# Patient Record
Sex: Female | Born: 1970 | Race: Black or African American | Hispanic: No | Marital: Single | Smoking: Former smoker
Health system: Southern US, Community
[De-identification: ages and names within clinical notes are randomized; demographics above are authoritative.]

## PROBLEM LIST (undated history)

## (undated) VITALS — BP 107/77 | HR 84 | Temp 97.8°F | Resp 16 | Ht 64.0 in | Wt 178.0 lb

## (undated) DIAGNOSIS — F319 Bipolar disorder, unspecified: Secondary | ICD-10-CM

## (undated) DIAGNOSIS — F32A Depression, unspecified: Secondary | ICD-10-CM

## (undated) DIAGNOSIS — G43909 Migraine, unspecified, not intractable, without status migrainosus: Secondary | ICD-10-CM

## (undated) DIAGNOSIS — F419 Anxiety disorder, unspecified: Secondary | ICD-10-CM

## (undated) DIAGNOSIS — I82409 Acute embolism and thrombosis of unspecified deep veins of unspecified lower extremity: Secondary | ICD-10-CM

## (undated) DIAGNOSIS — K589 Irritable bowel syndrome without diarrhea: Secondary | ICD-10-CM

## (undated) DIAGNOSIS — I10 Essential (primary) hypertension: Secondary | ICD-10-CM

## (undated) DIAGNOSIS — D649 Anemia, unspecified: Secondary | ICD-10-CM

## (undated) DIAGNOSIS — M1990 Unspecified osteoarthritis, unspecified site: Secondary | ICD-10-CM

## (undated) DIAGNOSIS — K219 Gastro-esophageal reflux disease without esophagitis: Secondary | ICD-10-CM

## (undated) DIAGNOSIS — F4310 Post-traumatic stress disorder, unspecified: Secondary | ICD-10-CM

## (undated) DIAGNOSIS — K56609 Unspecified intestinal obstruction, unspecified as to partial versus complete obstruction: Secondary | ICD-10-CM

## (undated) DIAGNOSIS — I2699 Other pulmonary embolism without acute cor pulmonale: Secondary | ICD-10-CM

## (undated) DIAGNOSIS — F329 Major depressive disorder, single episode, unspecified: Secondary | ICD-10-CM

## (undated) HISTORY — PX: TUBAL LIGATION: SHX77

## (undated) HISTORY — PX: CHOLECYSTECTOMY: SHX55

## (undated) HISTORY — PX: ABDOMINAL SURGERY: SHX537

## (undated) HISTORY — PX: ROTATOR CUFF REPAIR: SHX139

## (undated) HISTORY — PX: APPENDECTOMY: SHX54

## (undated) HISTORY — PX: ABDOMINAL HYSTERECTOMY: SHX81

## (undated) HISTORY — PX: KNEE ARTHROSCOPY: SHX127

---

## 2012-06-26 ENCOUNTER — Encounter (HOSPITAL_COMMUNITY): Payer: Self-pay | Admitting: Emergency Medicine

## 2012-06-26 DIAGNOSIS — Z8679 Personal history of other diseases of the circulatory system: Secondary | ICD-10-CM | POA: Insufficient documentation

## 2012-06-26 DIAGNOSIS — Z86711 Personal history of pulmonary embolism: Secondary | ICD-10-CM | POA: Insufficient documentation

## 2012-06-26 DIAGNOSIS — R079 Chest pain, unspecified: Secondary | ICD-10-CM | POA: Insufficient documentation

## 2012-06-26 DIAGNOSIS — Z8719 Personal history of other diseases of the digestive system: Secondary | ICD-10-CM | POA: Insufficient documentation

## 2012-06-26 DIAGNOSIS — Z86718 Personal history of other venous thrombosis and embolism: Secondary | ICD-10-CM | POA: Insufficient documentation

## 2012-06-26 DIAGNOSIS — I10 Essential (primary) hypertension: Secondary | ICD-10-CM | POA: Insufficient documentation

## 2012-06-26 DIAGNOSIS — Z8659 Personal history of other mental and behavioral disorders: Secondary | ICD-10-CM | POA: Insufficient documentation

## 2012-06-26 LAB — CBC WITH DIFFERENTIAL/PLATELET
Basophils Absolute: 0 10*3/uL (ref 0.0–0.1)
Basophils Relative: 0 % (ref 0–1)
MCH: 33.2 pg (ref 26.0–34.0)
MCHC: 35.7 g/dL (ref 30.0–36.0)
MCV: 92.8 fL (ref 78.0–100.0)
Metamyelocytes Relative: 0 %
Myelocytes: 0 %
Neutro Abs: 5 10*3/uL (ref 1.7–7.7)
Neutrophils Relative %: 49 % (ref 43–77)
Platelets: 238 10*3/uL (ref 150–400)
Promyelocytes Absolute: 0 %
nRBC: 0 /100 WBC

## 2012-06-26 LAB — COMPREHENSIVE METABOLIC PANEL
ALT: 10 U/L (ref 0–35)
AST: 20 U/L (ref 0–37)
Albumin: 4.1 g/dL (ref 3.5–5.2)
Alkaline Phosphatase: 86 U/L (ref 39–117)
BUN: 10 mg/dL (ref 6–23)
Chloride: 106 mEq/L (ref 96–112)
Potassium: 3.9 mEq/L (ref 3.5–5.1)
Sodium: 139 mEq/L (ref 135–145)
Total Bilirubin: 0.2 mg/dL — ABNORMAL LOW (ref 0.3–1.2)

## 2012-06-26 LAB — POCT I-STAT TROPONIN I: Troponin i, poc: 0.01 ng/mL (ref 0.00–0.08)

## 2012-06-26 NOTE — ED Notes (Signed)
Pt st's every am when she wakes up she has a headache and when she eats she develops chest pain that she describes as a tightness causing her to feel like she can't breath.  Pt st's her PCP has given her multi meds for GERD and reflux and it does not help.  Pt st's she has a lot of issues.  Pt very tearful in triage.

## 2012-06-27 ENCOUNTER — Emergency Department (HOSPITAL_COMMUNITY): Payer: Medicaid Other

## 2012-06-27 ENCOUNTER — Encounter (HOSPITAL_COMMUNITY): Payer: Self-pay | Admitting: Emergency Medicine

## 2012-06-27 ENCOUNTER — Emergency Department (HOSPITAL_COMMUNITY)
Admission: EM | Admit: 2012-06-27 | Discharge: 2012-06-27 | Disposition: A | Discharge status: Home / Self Care | Payer: Medicaid Other | Attending: Emergency Medicine | Admitting: Emergency Medicine

## 2012-06-27 DIAGNOSIS — R079 Chest pain, unspecified: Secondary | ICD-10-CM

## 2012-06-27 HISTORY — DX: Migraine, unspecified, not intractable, without status migrainosus: G43.909

## 2012-06-27 HISTORY — DX: Post-traumatic stress disorder, unspecified: F43.10

## 2012-06-27 HISTORY — DX: Acute embolism and thrombosis of unspecified deep veins of unspecified lower extremity: I82.409

## 2012-06-27 HISTORY — DX: Bipolar disorder, unspecified: F31.9

## 2012-06-27 HISTORY — DX: Unspecified intestinal obstruction, unspecified as to partial versus complete obstruction: K56.609

## 2012-06-27 HISTORY — DX: Irritable bowel syndrome, unspecified: K58.9

## 2012-06-27 HISTORY — DX: Other pulmonary embolism without acute cor pulmonale: I26.99

## 2012-06-27 HISTORY — DX: Essential (primary) hypertension: I10

## 2012-06-27 LAB — D-DIMER, QUANTITATIVE: D-Dimer, Quant: 0.95 ug{FEU}/mL — ABNORMAL HIGH (ref 0.00–0.48)

## 2012-06-27 MED ORDER — SODIUM CHLORIDE 0.9 % IV SOLN
Freq: Once | INTRAVENOUS | Status: AC
  Administered 2012-06-27: 03:00:00 via INTRAVENOUS

## 2012-06-27 MED ORDER — FAMOTIDINE 20 MG PO TABS: 20.0000 mg | ORAL_TABLET | Freq: Two times a day (BID) | ORAL | Status: DC

## 2012-06-27 MED ORDER — LORAZEPAM 2 MG/ML IJ SOLN
1.0000 mg | Freq: Once | INTRAMUSCULAR | Status: AC
  Administered 2012-06-27: 1 mg via INTRAVENOUS
  Filled 2012-06-27: qty 1

## 2012-06-27 MED ORDER — FAMOTIDINE IN NACL 20-0.9 MG/50ML-% IV SOLN
20.0000 mg | Freq: Once | INTRAVENOUS | Status: AC
  Administered 2012-06-27: 20 mg via INTRAVENOUS
  Filled 2012-06-27: qty 50

## 2012-06-27 MED ORDER — SUCRALFATE 1 G PO TABS: 1.0000 g | ORAL_TABLET | Freq: Four times a day (QID) | ORAL | Status: DC

## 2012-06-27 MED ORDER — IOHEXOL 350 MG/ML SOLN
100.0000 mL | Freq: Once | INTRAVENOUS | Status: AC | PRN
  Administered 2012-06-27: 100 mL via INTRAVENOUS

## 2012-06-27 MED ORDER — GI COCKTAIL ~~LOC~~
30.0000 mL | Freq: Once | ORAL | Status: AC
  Administered 2012-06-27: 30 mL via ORAL
  Filled 2012-06-27: qty 30

## 2012-06-27 NOTE — ED Notes (Signed)
Pt transported to XR, pt purse with pt in bed

## 2012-06-27 NOTE — ED Notes (Signed)
EDP [place] at bedside 

## 2012-06-27 NOTE — ED Notes (Signed)
MD at bedside. 

## 2012-06-27 NOTE — Discharge Instructions (Signed)
As discussed, it is important that you follow up as soon as possible with your physician for continued management of your condition.  Your pain is most consistent with a gastroenterologic cause.  If you develop any new, or concerning changes in your condition, please return to the emergency department immediately.   Chest Pain (Nonspecific) It is often hard to give a specific diagnosis for the cause of chest pain. There is always a chance that your pain could be related to something serious, such as a heart attack or a blood clot in the lungs. You need to follow up with your caregiver for further evaluation. CAUSES   Heartburn.  Pneumonia or bronchitis.  Anxiety or stress.  Inflammation around your heart (pericarditis) or lung (pleuritis or pleurisy).  A blood clot in the lung.  A collapsed lung (pneumothorax). It can develop suddenly on its own (spontaneous pneumothorax) or from injury (trauma) to the chest.  Shingles infection (herpes zoster virus). The chest wall is composed of bones, muscles, and cartilage. Any of these can be the source of the pain.  The bones can be bruised by injury.  The muscles or cartilage can be strained by coughing or overwork.  The cartilage can be affected by inflammation and become sore (costochondritis). DIAGNOSIS  Lab tests or other studies, such as X-rays, electrocardiography, stress testing, or cardiac imaging, may be needed to find the cause of your pain.  TREATMENT   Treatment depends on what may be causing your chest pain. Treatment may include:  Acid blockers for heartburn.  Anti-inflammatory medicine.  Pain medicine for inflammatory conditions.  Antibiotics if an infection is present.  You may be advised to change lifestyle habits. This includes stopping smoking and avoiding alcohol, caffeine, and chocolate.  You may be advised to keep your head raised (elevated) when sleeping. This reduces the chance of acid going backward from  your stomach into your esophagus.  Most of the time, nonspecific chest pain will improve within 2 to 3 days with rest and mild pain medicine. HOME CARE INSTRUCTIONS   If antibiotics were prescribed, take your antibiotics as directed. Finish them even if you start to feel better.  For the next few days, avoid physical activities that bring on chest pain. Continue physical activities as directed.  Do not smoke.  Avoid drinking alcohol.  Only take over-the-counter or prescription medicine for pain, discomfort, or fever as directed by your caregiver.  Follow your caregiver's suggestions for further testing if your chest pain does not go away.  Keep any follow-up appointments you made. If you do not go to an appointment, you could develop lasting (chronic) problems with pain. If there is any problem keeping an appointment, you must call to reschedule. SEEK MEDICAL CARE IF:   You think you are having problems from the medicine you are taking. Read your medicine instructions carefully.  Your chest pain does not go away, even after treatment.  You develop a rash with blisters on your chest. SEEK IMMEDIATE MEDICAL CARE IF:   You have increased chest pain or pain that spreads to your arm, neck, jaw, back, or abdomen.  You develop shortness of breath, an increasing cough, or you are coughing up blood.  You have severe back or abdominal pain, feel nauseous, or vomit.  You develop severe weakness, fainting, or chills.  You have a fever. THIS IS AN EMERGENCY. Do not wait to see if the pain will go away. Get medical help at once. Call your local emergency  services (911 in [place] Do not drive yourself to the hospital. MAKE SURE YOU:   Understand these instructions.  Will watch your condition.  Will get help right away if you are not doing well or get worse. Document Released: 01/12/2005 Document Revised: 06/27/2011 Document Reviewed: 11/08/2007 [facility] Patient Information 2013  [place], [place].

## 2012-06-27 NOTE — ED Provider Notes (Signed)
History     CSN: [id number]  Arrival date & time 06/26/12  2041   First MD Initiated Contact with Patient 06/27/12 [phone]      Chief Complaint  Patient presents with  . Chest Pain    (Consider location/radiation/quality/duration/timing/severity/associated sxs/prior treatment) HPI The patient presents with chest pain, dyspnea.  She states that symptoms began approximately 2 days ago.  Since onset she has had intermittent, but mostly present symptoms.  Symptoms are minimally improved with OTC medication for heartburn.  She notes that there is concurrent anxiety over her symptoms.  She denies syncope, generalized weakness, other pain. She has a history of DVT, is not currently taking anticoagulant.  She denies any current, or recent lower extremity changes, including edema. She does not smoke, has not traveled, has no new medication. Past Medical History  Diagnosis Date  . Hypertension   . Migraine   . IBS (irritable bowel syndrome)   . Bipolar affective disorder   . PTSD (post-traumatic stress disorder)   . Pulmonary embolism   . DVT (deep venous thrombosis)   . Bowel obstruction     Past Surgical History  Procedure Laterality Date  . Abdominal surgery    . Abdominal hysterectomy    . Knee arthroscopy    . Appendectomy    . Cholecystectomy      No family history on file.  History  Substance Use Topics  . Smoking status: Never Smoker   . Smokeless tobacco: Not on file  . Alcohol Use: Yes    OB History   Grav Para Term Preterm Abortions TAB SAB Ect Mult Living                  Review of Systems  Constitutional:       Per HPI, otherwise negative  HENT:       Per HPI, otherwise negative  Respiratory:       Per HPI, otherwise negative  Cardiovascular:       Per HPI, otherwise negative  Gastrointestinal: Negative for vomiting.  Endocrine:       Negative aside from HPI  Genitourinary:       Neg aside from HPI   Musculoskeletal:       Per HPI, otherwise  negative  Skin: Negative.   Neurological: Negative for syncope.    Allergies  Benadryl; Ciprofloxacin hcl; Morphine and related; and Sulfa antibiotics  Home Medications  No current outpatient prescriptions on file.  BP 108/70  Pulse 96  Temp(Src) 98.8 F (37.1 C) (Oral)  Resp 18  SpO2 100%  Physical Exam  Nursing note and vitals reviewed. Constitutional: She is oriented to person, place, and time. She appears well-developed and well-nourished. No distress.  HENT:  Head: Normocephalic and atraumatic.  Eyes: Conjunctivae and EOM are normal.  Cardiovascular: Regular rhythm.  Tachycardia present.   Pulmonary/Chest: Breath sounds normal. No stridor. Tachypnea noted. No respiratory distress.  Abdominal: She exhibits no distension.  Musculoskeletal: She exhibits no edema and no tenderness.  Neurological: She is alert and oriented to person, place, and time. No cranial nerve deficit.  Skin: Skin is warm and dry.  Psychiatric: Her mood appears anxious.    ED Course  Procedures (including critical care time)  Labs Reviewed  CBC WITH DIFFERENTIAL - Abnormal; Notable for the following:    Lymphs Abs 4.4 (*)    All other components within normal limits  COMPREHENSIVE METABOLIC PANEL - Abnormal; Notable for the following:    Total Bilirubin 0.2 (*)  All other components within normal limits  D-DIMER, QUANTITATIVE - Abnormal; Notable for the following:    D-Dimer, Quant 0.95 (*)    All other components within normal limits  POCT I-STAT TROPONIN I   No results found.   No diagnosis found.  Pulse ox 99% room air normal Cardiac 110 sinus tach abnormal   3:38 AM Initial labs notable for elevated D. dimer.  CT ordered   Date: 06/27/2012  Rate: 89  Rhythm: normal sinus rhythm  QRS Axis: normal  Intervals: normal  ST/T Wave abnormalities: normal  Conduction Disutrbances: none  Narrative Interpretation: unremarkable   6:14 AM Patient asleep.  Oxygen saturation is  100%.  She is clearly in no distress.   MDM  This patient with a history of prior DVT now presents with concerns of chest pain, dyspnea.  On exam the patient is initially very uncomfortable, but not hypoxic or tachypneic.  Given the absence of ongoing anticoagulation, the patient had a d-dimer sent which was positive.  A followup CT chest did not demonstrate pulmonary embolism.  On repeat exam the patient was calm, sleeping.  Given her description of burning chest pain, this presentation is more consistent with gastro-esophageal etiology, and a nonischemic EKG, was largely reassuring labs is significant for the low suspicion of coronary ischemia.  The patient was started on antacids, discharged in stable condition to follow up with her primary care physician.       Gerhard Munch, MD 06/27/12 [phone]

## 2012-06-27 NOTE — ED Notes (Signed)
Pt remains in CT at this time.  

## 2012-06-27 NOTE — ED Notes (Signed)
Pt returned from CT, placed back on monitor.

## 2012-06-27 NOTE — ED Notes (Signed)
Pt transported to CT ?

## 2012-06-27 NOTE — ED Notes (Signed)
Pt returned from XR, placed back on monitor. 

## 2012-09-16 ENCOUNTER — Emergency Department (HOSPITAL_COMMUNITY): Payer: Medicaid Other

## 2012-09-16 ENCOUNTER — Emergency Department (HOSPITAL_COMMUNITY)
Admission: EM | Admit: 2012-09-16 | Discharge: 2012-09-16 | Disposition: A | Discharge status: Home / Self Care | Payer: Medicaid Other | Attending: Emergency Medicine | Admitting: Emergency Medicine

## 2012-09-16 ENCOUNTER — Encounter (HOSPITAL_COMMUNITY): Payer: Self-pay | Admitting: *Deleted

## 2012-09-16 DIAGNOSIS — Z86718 Personal history of other venous thrombosis and embolism: Secondary | ICD-10-CM | POA: Insufficient documentation

## 2012-09-16 DIAGNOSIS — Z8669 Personal history of other diseases of the nervous system and sense organs: Secondary | ICD-10-CM | POA: Insufficient documentation

## 2012-09-16 DIAGNOSIS — K589 Irritable bowel syndrome without diarrhea: Secondary | ICD-10-CM | POA: Insufficient documentation

## 2012-09-16 DIAGNOSIS — R1110 Vomiting, unspecified: Secondary | ICD-10-CM | POA: Insufficient documentation

## 2012-09-16 DIAGNOSIS — A599 Trichomoniasis, unspecified: Secondary | ICD-10-CM

## 2012-09-16 DIAGNOSIS — Z79899 Other long term (current) drug therapy: Secondary | ICD-10-CM | POA: Insufficient documentation

## 2012-09-16 DIAGNOSIS — R197 Diarrhea, unspecified: Secondary | ICD-10-CM | POA: Insufficient documentation

## 2012-09-16 DIAGNOSIS — F319 Bipolar disorder, unspecified: Secondary | ICD-10-CM | POA: Insufficient documentation

## 2012-09-16 DIAGNOSIS — Z8679 Personal history of other diseases of the circulatory system: Secondary | ICD-10-CM | POA: Insufficient documentation

## 2012-09-16 DIAGNOSIS — Z3202 Encounter for pregnancy test, result negative: Secondary | ICD-10-CM | POA: Insufficient documentation

## 2012-09-16 DIAGNOSIS — I10 Essential (primary) hypertension: Secondary | ICD-10-CM | POA: Insufficient documentation

## 2012-09-16 DIAGNOSIS — R109 Unspecified abdominal pain: Secondary | ICD-10-CM

## 2012-09-16 DIAGNOSIS — B9689 Other specified bacterial agents as the cause of diseases classified elsewhere: Secondary | ICD-10-CM

## 2012-09-16 DIAGNOSIS — Z8659 Personal history of other mental and behavioral disorders: Secondary | ICD-10-CM | POA: Insufficient documentation

## 2012-09-16 DIAGNOSIS — N760 Acute vaginitis: Secondary | ICD-10-CM

## 2012-09-16 LAB — CBC WITH DIFFERENTIAL/PLATELET
Eosinophils Absolute: 0.1 10*3/uL (ref 0.0–0.7)
Eosinophils Relative: 1 % (ref 0–5)
HCT: 37.2 % (ref 36.0–46.0)
Hemoglobin: 12.8 g/dL (ref 12.0–15.0)
Lymphocytes Relative: 49 % — ABNORMAL HIGH (ref 12–46)
Lymphs Abs: 2.1 10*3/uL (ref 0.7–4.0)
MCH: 32.1 pg (ref 26.0–34.0)
MCV: 93.2 fL (ref 78.0–100.0)
Monocytes Absolute: 0.5 10*3/uL (ref 0.1–1.0)
Monocytes Relative: 11 % (ref 3–12)
RBC: 3.99 MIL/uL (ref 3.87–5.11)
WBC: 4.4 10*3/uL (ref 4.0–10.5)

## 2012-09-16 LAB — URINALYSIS, ROUTINE W REFLEX MICROSCOPIC
Ketones, ur: 15 mg/dL — AB
Nitrite: NEGATIVE
Specific Gravity, Urine: 1.026 (ref 1.005–1.030)
Urobilinogen, UA: 0.2 mg/dL (ref 0.0–1.0)
pH: 5.5 (ref 5.0–8.0)

## 2012-09-16 LAB — COMPREHENSIVE METABOLIC PANEL
ALT: 21 U/L (ref 0–35)
BUN: 6 mg/dL (ref 6–23)
CO2: 21 mEq/L (ref 19–32)
Calcium: 9.4 mg/dL (ref 8.4–10.5)
GFR calc Af Amer: >90 mL/min (ref >90)
GFR calc non Af Amer: >90 mL/min (ref >90)
Glucose, Bld: 105 mg/dL — ABNORMAL HIGH (ref 70–99)
Total Protein: 7.3 g/dL (ref 6.0–8.3)

## 2012-09-16 LAB — WET PREP, GENITAL

## 2012-09-16 LAB — URINE MICROSCOPIC-ADD ON

## 2012-09-16 LAB — LIPASE, BLOOD: Lipase: 17 U/L (ref 11–59)

## 2012-09-16 MED ORDER — HYDROCODONE-ACETAMINOPHEN 5-325 MG PO TABS: 2.0000 | ORAL_TABLET | Freq: Three times a day (TID) | ORAL | Status: DC | PRN

## 2012-09-16 MED ORDER — HYDROMORPHONE HCL PF 1 MG/ML IJ SOLN
0.5000 mg | Freq: Once | INTRAMUSCULAR | Status: AC
  Administered 2012-09-16: 0.5 mg via INTRAVENOUS
  Filled 2012-09-16: qty 1

## 2012-09-16 MED ORDER — IOHEXOL 300 MG/ML  SOLN
100.0000 mL | Freq: Once | INTRAMUSCULAR | Status: AC | PRN
  Administered 2012-09-16: 100 mL via INTRAVENOUS

## 2012-09-16 MED ORDER — METRONIDAZOLE 500 MG PO TABS: 500.0000 mg | ORAL_TABLET | Freq: Two times a day (BID) | ORAL | Status: DC

## 2012-09-16 MED ORDER — IOHEXOL 300 MG/ML  SOLN
50.0000 mL | Freq: Once | INTRAMUSCULAR | Status: AC | PRN
  Administered 2012-09-16: 50 mL via ORAL

## 2012-09-16 MED ORDER — SODIUM CHLORIDE 0.9 % IV BOLUS (SEPSIS)
1000.0000 mL | Freq: Once | INTRAVENOUS | Status: AC
  Administered 2012-09-16: 1000 mL via INTRAVENOUS

## 2012-09-16 MED ORDER — HYDROMORPHONE HCL PF 1 MG/ML IJ SOLN
1.0000 mg | Freq: Once | INTRAMUSCULAR | Status: AC
  Administered 2012-09-16: 1 mg via INTRAVENOUS
  Filled 2012-09-16: qty 1

## 2012-09-16 MED ORDER — ONDANSETRON HCL 4 MG/2ML IJ SOLN
4.0000 mg | Freq: Once | INTRAMUSCULAR | Status: AC
  Administered 2012-09-16: 4 mg via INTRAVENOUS
  Filled 2012-09-16: qty 2

## 2012-09-16 NOTE — ED Provider Notes (Signed)
Complains of lower abdominal pain radiating to upper abdomen onset 2 days ago compared to multiple episodes of vomiting. On exam patient appears uncomfortable abdomen tender percussion of lower quadrants and tender over bilateral upper quadrants.  Doug Sou, MD 09/16/12 [phone]

## 2012-09-16 NOTE — ED Provider Notes (Signed)
Discussed case with Wynetta Emery, PA-C - transfer of care from [facility], PA-C at 3:10PM. Patient presenting with LLQ pain x 1 week with nausea and vomiting for the past 2 days. Patient has a significant history of abdominal surgeries - at least 5-6 surgeries as per patient. Negative findings on labs - negative elevated WBC, electrolytes within normal limits, lipase within normal limits. Abdominal exam: BS normoactive, extremely tender upon palpation to abdomen - generalized. CT abdomen with contrast - negative bowel obstruction, no bowel wall thickening or inflammation. Abdominal and Pelvic US ordered to rule out ovarian torsion.  Rectal: negative hemorrhoids, inflammation, swelling, lesions, sores noted externally. Negative lesions, sores, fissures noted. Sphincter tone proper. Negative blood on glove. Stool brown. Negative facl occult test.  Pelvic exam: negative lesions, sores, inflammation, swelling, erythema noted to the external genitalia. Negative lesions, sores, fissures, lesions noted to the vaginal canal. Negative blood in vaginal vault. Mild white discharge, thick noted to pelvic exam. Negative erythema, inflammation, strawberry cervix noted. Negative masses, lesions, fissures felt upon palpation. Pain with swiping of abdomen and adenexa. Negative CMT.  Wet prep - few trich, few clue cells, few WBC noted.   US pelvic - negative findings.  Discussed case and findings with Dr. Brandon Melnick - recommended that patient to be discharged since nothing inflammatory found or acute findings found. Negative findings on CT and [place]. Pain managed in ED setting. Abdominal pain unknown etiology. Patient stable, afebrile, no elevated WBC count. CMP within normal limits. Lipase within normal limits. Negative bowel obstruction, inflammation, ovarian torsion. BC and trich infection noted. PERC negative. Patient cleared for discharge by Dr. Algis Downs. Judd Lien. Patient discharged with antibiotic therapy and pain medications.  Discussed with patient to rest and stay hydrated - discussed precautions while on pain medications. Referred to [facility] outpatient and GI. Discussed with patient to stay with liquid diet. Discussed with patient to monitor symptoms and if symptoms do not get better within 24 hours to return back to the ED. Patient agreed to plan of care, understood, all questions answered.     Raymon Mutton, PA-C 09/17/12 0202  Raymon Mutton, PA-C 09/17/12 0865  Raymon Mutton, PA-C 09/17/12 [id number]

## 2012-09-16 NOTE — ED Notes (Signed)
Pt unable to void at the present time
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
## 2012-09-16 NOTE — ED Notes (Signed)
Patient transported to Ultrasound 

## 2012-09-16 NOTE — ED Notes (Signed)
Pt states over last week started having lower abdominal pain and since Friday started having vomiting and diarrhea.   No urinary s/s.  No vaginal symptoms.  LMP  Hysterectomy.  PT reports right lower back pain

## 2012-09-17 LAB — GC/CHLAMYDIA PROBE AMP: CT Probe RNA: NEGATIVE

## 2012-09-17 NOTE — ED Provider Notes (Signed)
Medical screening examination/treatment/procedure(s) were performed by non-physician practitioner and as supervising physician I was immediately available for consultation/collaboration.  Sricharan Lacomb, MD 09/17/12 0816 

## 2012-09-18 NOTE — ED Provider Notes (Signed)
Spoke with patient today, 09/18/2012 at 3:20PM, via telephone, to check and see how patient is doing. Patient reported that abdominal pain has not gotten any better - stated that she is still experiencing shooting pains, feeling nauseous with medications, still experiencing pain even with pain medications - stated that she is not feeling like herself. Instructed patient to go to [facility] ED to get re-evaluated immediately - patient understood and agreed to going to the ED for re-evaluation. Patient agreed to plan and stated that she will go to the hospital.    Raymon Mutton, PA-C 09/19/12 0351  Raymon Mutton, PA-C 09/19/12 4098

## 2012-09-19 NOTE — ED Provider Notes (Signed)
Medical screening examination/treatment/procedure(s) were conducted as a shared visit with non-physician practitioner(s) and myself.  I personally evaluated the patient during the encounter  Doug Sou, MD 09/19/12 [phone]

## 2012-09-19 NOTE — ED Provider Notes (Signed)
Medical screening examination/treatment/procedure(s) were performed by non-physician practitioner and as supervising physician I was immediately available for consultation/collaboration.  Nyazia Canevari, MD 09/19/12 0807 

## 2012-11-15 ENCOUNTER — Encounter (HOSPITAL_COMMUNITY): Payer: Self-pay | Admitting: *Deleted

## 2012-11-15 ENCOUNTER — Telehealth (HOSPITAL_COMMUNITY): Payer: Self-pay

## 2012-11-15 ENCOUNTER — Inpatient Hospital Stay (HOSPITAL_COMMUNITY)
Admission: RE | Admit: 2012-11-15 | Discharge: 2012-11-22 | DRG: 885 | Disposition: A | Discharge status: Home / Self Care | Payer: Medicaid Other | Attending: Psychiatry | Admitting: Psychiatry

## 2012-11-15 DIAGNOSIS — F411 Generalized anxiety disorder: Secondary | ICD-10-CM | POA: Diagnosis present

## 2012-11-15 DIAGNOSIS — R45851 Suicidal ideations: Secondary | ICD-10-CM

## 2012-11-15 DIAGNOSIS — Z79899 Other long term (current) drug therapy: Secondary | ICD-10-CM

## 2012-11-15 DIAGNOSIS — F3130 Bipolar disorder, current episode depressed, mild or moderate severity, unspecified: Principal | ICD-10-CM | POA: Diagnosis present

## 2012-11-15 DIAGNOSIS — I10 Essential (primary) hypertension: Secondary | ICD-10-CM | POA: Diagnosis present

## 2012-11-15 DIAGNOSIS — F3160 Bipolar disorder, current episode mixed, unspecified: Secondary | ICD-10-CM

## 2012-11-15 DIAGNOSIS — F4310 Post-traumatic stress disorder, unspecified: Secondary | ICD-10-CM | POA: Diagnosis present

## 2012-11-15 DIAGNOSIS — F1910 Other psychoactive substance abuse, uncomplicated: Secondary | ICD-10-CM

## 2012-11-15 LAB — COMPREHENSIVE METABOLIC PANEL
AST: 22 U/L (ref 0–37)
CO2: 22 mEq/L (ref 19–32)
Calcium: 8.9 mg/dL (ref 8.4–10.5)
Creatinine, Ser: 0.8 mg/dL (ref 0.50–1.10)
GFR calc Af Amer: >90 mL/min (ref >90)
GFR calc non Af Amer: 88 mL/min — ABNORMAL LOW (ref >90)
Total Protein: 7 g/dL (ref 6.0–8.3)

## 2012-11-15 LAB — CBC
MCH: 31.4 pg (ref 26.0–34.0)
MCHC: 33.6 g/dL (ref 30.0–36.0)
MCV: 93.5 fL (ref 78.0–100.0)
Platelets: 255 10*3/uL (ref 150–400)
RBC: 3.98 MIL/uL (ref 3.87–5.11)

## 2012-11-15 MED ORDER — ALUM & MAG HYDROXIDE-SIMETH 200-200-20 MG/5ML PO SUSP: 30.0000 mL | ORAL | Status: DC | PRN

## 2012-11-15 MED ORDER — METOPROLOL SUCCINATE ER 25 MG PO TB24
25.0000 mg | ORAL_TABLET | Freq: Every day | ORAL | Status: DC
  Administered 2012-11-15 – 2012-11-22 (×8): 25 mg via ORAL
  Filled 2012-11-15 (×10): qty 1

## 2012-11-15 MED ORDER — BUPROPION HCL ER (XL) 150 MG PO TB24
150.0000 mg | ORAL_TABLET | Freq: Every day | ORAL | Status: DC
  Administered 2012-11-15 – 2012-11-22 (×8): 150 mg via ORAL
  Filled 2012-11-15: qty 1
  Filled 2012-11-15: qty 14
  Filled 2012-11-15 (×8): qty 1

## 2012-11-15 MED ORDER — DICYCLOMINE HCL 10 MG PO CAPS
10.0000 mg | ORAL_CAPSULE | Freq: Three times a day (TID) | ORAL | Status: DC | PRN
  Filled 2012-11-15: qty 1

## 2012-11-15 MED ORDER — CLONAZEPAM 0.5 MG PO TABS
0.5000 mg | ORAL_TABLET | Freq: Two times a day (BID) | ORAL | Status: DC
  Administered 2012-11-15 – 2012-11-16 (×2): 0.5 mg via ORAL
  Filled 2012-11-15 (×2): qty 1

## 2012-11-15 MED ORDER — TOPIRAMATE 100 MG PO TABS
100.0000 mg | ORAL_TABLET | Freq: Every day | ORAL | Status: DC
  Administered 2012-11-15 – 2012-11-16 (×2): 100 mg via ORAL
  Filled 2012-11-15 (×3): qty 1

## 2012-11-15 MED ORDER — ACETAMINOPHEN 500 MG PO TABS: 1000.0000 mg | ORAL_TABLET | Freq: Four times a day (QID) | ORAL | Status: DC | PRN

## 2012-11-15 MED ORDER — HYDROXYZINE HCL 25 MG PO TABS
25.0000 mg | ORAL_TABLET | Freq: Four times a day (QID) | ORAL | Status: DC | PRN
  Administered 2012-11-17 – 2012-11-19 (×2): 25 mg via ORAL

## 2012-11-15 MED ORDER — ACETAMINOPHEN 325 MG PO TABS: 650.0000 mg | ORAL_TABLET | Freq: Four times a day (QID) | ORAL | Status: DC | PRN

## 2012-11-15 MED ORDER — TRAZODONE HCL 50 MG PO TABS
50.0000 mg | ORAL_TABLET | Freq: Every evening | ORAL | Status: DC | PRN
  Administered 2012-11-15 – 2012-11-18 (×2): 50 mg via ORAL
  Filled 2012-11-15 (×15): qty 1

## 2012-11-15 MED ORDER — PANTOPRAZOLE SODIUM 40 MG PO TBEC
40.0000 mg | DELAYED_RELEASE_TABLET | Freq: Every day | ORAL | Status: DC
  Administered 2012-11-15 – 2012-11-22 (×8): 40 mg via ORAL
  Filled 2012-11-15 (×10): qty 1

## 2012-11-15 MED ORDER — MAGNESIUM HYDROXIDE 400 MG/5ML PO SUSP: 30.0000 mL | Freq: Every day | ORAL | Status: DC | PRN

## 2012-11-15 MED ORDER — RISPERIDONE 1 MG PO TABS
1.0000 mg | ORAL_TABLET | Freq: Two times a day (BID) | ORAL | Status: DC
  Administered 2012-11-15 – 2012-11-17 (×4): 1 mg via ORAL
  Filled 2012-11-15 (×8): qty 1
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
## 2012-11-15 NOTE — Progress Notes (Signed)
Patient ID: Karen [name], female   DOB: 03/04/1971, 42 y.o.   MRN: [id number] D: Pt. Lying down nonverbal upon approach using shoulders and nodding head, after persistence of writer pt. Began to verbalize feelings, "depressed, having negative thoughts, don't want to be around anybody" Pt. Reports she had a lot of med changes and just when she think one is working it doesn't and she is right back to where she started. "I'm up feeling good getting dressed doing things, then back having a hard time being around people" " I think they are saying things about me" Pt. Reports she is on disability and stay home a lot from around people. Have racing thoughts, agitated" "I don't have nobody" Pt. Reports she came here from [place], where she had a therapist, but went to see her one day and she wouldn't see her. I went to the office an hour early and the receptionist said we are not ready for anybody yet and asked me to go sit down away from the desk and when I did a few minutes later she announced they were ready to see client's and everyone rushed in ahead of me. My therapist came out to get someone and I tried to get her attention and she snapped at me and asked what did I want. "this is a woman whom I shared everything with, she was like a spiritual friend, it hurt me so bad I left." I left and hadn't been back, been off my medications since then (about six months). A: Writer introduced self to client and provided emotional support. Writer reported to pt. By trying to hurt therapist she in turn hurt herself as she was the one without meds and ended up in the hospital. Pt. Nods in agreement. Writer encouraged pt. Never to get the therapist-client role confused as they are not friends but working together in a therapeutic environment and when one confuses the roles and makes it personal, you become emotional and take on feelings that cause hurt. Pt. Encouraged to move forward with this admission and learn coping skills that  will allow her to move forward and get the support she needs from groups and plan to receive help with a new therapist. Writer encouraged group. Staff will monitor for safety R: Pt. Tearful but acknowledges her dependence on therapist as a friend was innappropriate. Pt. Did not attend karaoke, but  Did ask for magazines and sat up and read.  Staff will monitor q53min for safety.

## 2012-11-15 NOTE — Progress Notes (Signed)
Patient did not attend 0900 RN group. 

## 2012-11-15 NOTE — [facility] Group Notes (Signed)
[facility] LCSW Group Therapy  11/15/2012  1:15 PM   Type of Therapy:  Group Therapy  Participation Level:  Did Not Attend  Siomara Burkel Horton, LCSWA 11/15/2012 1:36 PM   

## 2012-11-15 NOTE — Progress Notes (Signed)
D: Patient appropriate and cooperative with staff. Pt's affect/mood is flat and depressed. She reported on the self inventory sheet that she had to request medication to sleep, appetite is improving, energy level is low and ability to pay attention is poor. Patient rated depression and feelings of hopelessness "10". Patient has not attended any groups at this time. She's compliant with medication regimen.  A: Support and encouragement provided to patient. Administered scheduled medications per ordering MD. Monitor Q15 minute checks for safety.  R: Patient receptive. Passive SI, but contracts for safety. Denies HI and AVH. Patient remains safe on the unit.

## 2012-11-15 NOTE — Progress Notes (Signed)
Pt did not attend Karaoke group.  
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
## 2012-11-15 NOTE — Progress Notes (Signed)
Adult Psychoeducational Group Note  Date:  11/15/2012 Time:  11:00AM Group Topic/Focus:  Self Esteem Action Plan:   The focus of this group is to help patients create a plan to continue to build self-esteem after discharge.  Participation Level:  Did Not Attend    Additional Comments: Pt. Didn't attend group.   Bing Plume D 11/15/2012, 12:57 PM

## 2012-11-15 NOTE — Progress Notes (Signed)
Patient ID: Karen [name], female   DOB: 1970/09/22, 42 y.o.   MRN: [id number]  Pt was anxious, irritable, and tearful but cooperative during the adm process. Stated she has passive SI, but denied at the time of adm.  When asked the circumstances surrounding her adm pt stated, "I've been too long and not having no medicine. I've been holding on. I need something that helps me because nothing helps me".  Pt stated she's been on several meds in the past which include Lithium, Effexor, xanax, wellbutrin, and paxil.  Stated she wants a real psychiatrist that can help her. Wants to be able to tell them if the med is not working and wants them to change the Rx. Pt stated she "doesn't trust anybody". Stated that she "gives and gives". Pt is upset that her son "dropped" her off at [facility]. Stated that she gives everything to him, and "doesn't understand how he could do it". Pt stated she's also estranged from the rest of her family, stating "I have no family". Pt has passive, but denies at time of adm.
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
## 2012-11-15 NOTE — Tx Team (Signed)
Initial Interdisciplinary Treatment Plan  PATIENT STRENGTHS: (choose at least two) Average or above average intelligence Capable of independent living Communication skills Motivation for treatment/growth Physical Health Religious Affiliation  PATIENT STRESSORS: Marital or family conflict Medication change or noncompliance   PROBLEM LIST: Problem List/Patient Goals Date to be addressed Date deferred Reason deferred Estimated date of resolution  Would like for a Dr to try her on meds and if they dont work try her on something else 11/15/12                 Increased risk for suicide 11/15/12     Depression 11/15/12                              DISCHARGE CRITERIA:  Ability to meet basic life and health needs Adequate post-discharge living arrangements Improved stabilization in mood, thinking, and/or behavior Medical problems require only outpatient monitoring Motivation to continue treatment in a less acute level of care Need for constant or close observation no longer present Reduction of life-threatening or endangering symptoms to within safe limits Safe-care adequate arrangements made Verbal commitment to aftercare and medication compliance  PRELIMINARY DISCHARGE PLAN: Outpatient therapy Participate in family therapy Return to previous living arrangement  PATIENT/FAMIILY INVOLVEMENT: This treatment plan has been presented to and reviewed with the patient, Karen [name], and/or family member.  The patient and family have been given the opportunity to ask questions and make suggestions.  [name] 11/15/2012, 1:34 AM

## 2012-11-16 DIAGNOSIS — Z91199 Patient's noncompliance with other medical treatment and regimen due to unspecified reason: Secondary | ICD-10-CM

## 2012-11-16 DIAGNOSIS — Z9119 Patient's noncompliance with other medical treatment and regimen: Secondary | ICD-10-CM

## 2012-11-16 MED ORDER — TOPIRAMATE 100 MG PO TABS
200.0000 mg | ORAL_TABLET | Freq: Every day | ORAL | Status: DC
  Administered 2012-11-17: 200 mg via ORAL
  Filled 2012-11-16 (×3): qty 2

## 2012-11-16 MED ORDER — CLONAZEPAM 1 MG PO TABS
1.0000 mg | ORAL_TABLET | Freq: Two times a day (BID) | ORAL | Status: DC
  Administered 2012-11-16 – 2012-11-17 (×2): 1 mg via ORAL
  Filled 2012-11-16 (×2): qty 1

## 2012-11-16 NOTE — Progress Notes (Signed)
Adult Psychoeducational Group Note  Date:  11/16/2012 Time:  6:37 PM  Group Topic/Focus:  Relapse Prevention Planning:   The focus of this group is to define relapse and discuss the need for planning to combat relapse.  Participation Level:  Minimal  Participation Quality:  Attentive and Resistant  Affect:  Appropriate  Cognitive:  Appropriate  Insight: None  Engagement in Group:  None  Modes of Intervention:  Discussion  Additional Comments:  Pt attended group but did not participate.   Tora Perches N 11/16/2012, 6:37 PM

## 2012-11-16 NOTE — [facility] Group Notes (Signed)
Adult Psychoeducational Group Note  Date:  11/16/2012 Time:  10:11 PM  Group Topic/Focus:  Wrap-Up Group:   The focus of this group is to help patients review their daily goal of treatment and discuss progress on daily workbooks.  Participation Level:  Minimal  Participation Quality:  Attentive  Affect:  Flat  Cognitive:  Alert  Insight: Limited  Engagement in Group:  Limited  Modes of Intervention:  Discussion  Additional Comments:  Karen [name] was attentive during group.  She stated she didn't have a goal for today.  Karen [name] A 11/16/2012, 10:11 PM

## 2012-11-16 NOTE — [facility] Group Notes (Signed)
[facility] LCSW Aftercare Discharge Planning Group Note   11/16/2012  8:45 AM  Participation Quality:  Alert and Appropriate   Mood/Affect:  Appropriate, Flat and Depressed  Depression Rating:  10  Anxiety Rating:  10  Thoughts of Suicide:  Pt denies SI/HI  Will you contract for safety?   Yes  Current AVH:  Pt denies  Plan for Discharge/Comments:  Pt attended discharge planning group and actively participated in group.  CSW provided pt with today's workbook.  Pt states that she is not doing well today.  Pt states that she will return home in [place].  Pt does not have any follow up.  CSW will assess for appropriate referrals.  No further needs voiced by pt at this time.    Transportation Means: Pt reports access to transportation  Supports: Pt names son as supportive  Karen [name], Karen [name] 11/16/2012 9:42 AM

## 2012-11-16 NOTE — Progress Notes (Signed)
D: Patient pleasant and cooperative with staff. Patient's affect/mood is flat, sad and depressed. She reported on the self inventory sheet that her sleep is fair, appetite is improving, energy level is low and ability to pay attention is poor. Patient rated depression and feelings of hopelessness "9". She's attending groups today and sitting in the dayroom some before and after groups, but having no interaction with peers. Patient forwards little information.  A: Support and encouragement provided to patient. Scheduled medications administered per MD orders. Maintain Q15 minute checks for safety.  R: Patient receptive. Denies SI/HI/AVH. Patient remains safe.

## 2012-11-16 NOTE — Tx Team (Signed)
Interdisciplinary Treatment Plan Update (Adult)  Date: 11/16/2012  Time Reviewed:  9:45 AM  Progress in Treatment: Attending groups: Yes Participating in groups:  Yes Taking medication as prescribed:  Yes Tolerating medication:  Yes Family/Significant othe contact made: CSW assessing  Patient understands diagnosis:  Yes Discussing patient identified problems/goals with staff:  Yes Medical problems stabilized or resolved:  Yes Denies suicidal/homicidal ideation: Yes Issues/concerns per patient self-inventory:  Yes Other:  New problem(s) identified: N/A  Discharge Plan or Barriers: CSW assessing for appropriate referrals.  Reason for Continuation of Hospitalization: Anxiety Depression Medication Stabilization  Comments: N/A  Estimated length of stay: 3-5 days  For review of initial/current patient goals, please see plan of care.  Attendees: Patient:     Family:     Physician:  Dr. Johnalagadda 11/16/2012 11:02 AM   Nursing:   Sheila Lilly, RN 11/16/2012 11:02 AM   Clinical Social Worker:  Cacie Gaskins Horton, LCSWA 11/16/2012 11:02 AM   Other: Cynthia Goble, RN 11/16/2012 11:02 AM   Other:  Maseta Dorley, MA care coordination 11/16/2012 11:02 AM   Other:  Jennifer Clark, case manager 11/16/2012 11:02 AM   Other:  Roneica Byrd, RN 11/16/2012 11:02 AM  Other:    Other:    Other:    Other:    Other:    Other:     Scribe for Treatment Team:   Horton, Xandria Gallaga Nicole, 11/16/2012 11:02 AM    
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
## 2012-11-16 NOTE — Progress Notes (Signed)
[facility] MD Progress Note  11/16/2012 11:54 AM Karen [name]  MRN:  [id number] Subjective:  Patient has been suffering with a bipolar disorder with most recent episode of mixed with the depression and anxiety. Patient also has diagnosis of posttraumatic stress decided on noncompliant with her medications about 6 months. Patient stated she has been confused and unable to provide linear and goal-directed history. Patient stated she slept okay with her medication last night. Patient keep ask what is going on with me, I am not able to control my thoughts in mind which racing thoughts are bombarding in my head and unable to focus on one topic.  Diagnosis:  Axis I: Bipolar, mixed, Post Traumatic Stress Disorder and Noncompliant with medication management  ADL's:  Impaired  Sleep: Fair  Appetite:  Fair  Suicidal Ideation:  Patient continued to have suicidal thoughts and confusion Homicidal Ideation:  Denied AEB (as evidenced by):  Psychiatric Specialty Exam: ROS  Blood pressure 130/86, pulse 109, temperature 97.9 F (36.6 C), temperature source Oral, resp. rate 17, height 5\' 4"  (1.626 m), weight 80.74 kg (178 lb).Body mass index is 30.54 kg/(m^2).  General Appearance: Bizarre, Disheveled and Guarded  Eye Contact::  Minimal  Speech:  Blocked, Garbled, Slow and Slurred  Volume:  Decreased  Mood:  Anxious, Depressed, Dysphoric, Hopeless, Irritable and Worthless  Affect:  Constricted, Depressed, Inappropriate, Labile and Tearful  Thought Process:  Circumstantial, Disorganized, Loose and Tangential  Orientation:  Full (Time, Place, and Person)  Thought Content:  Paranoid Ideation and Rumination  Suicidal Thoughts:  Yes.  without intent/plan  Homicidal Thoughts:  No  Memory:  Immediate;   Poor  Judgement:  Impaired  Insight:  Lacking  Psychomotor Activity:  Decreased and Restlessness  Concentration:  Poor  Recall:  Fair  Akathisia:  NA  Handed:  Right  AIMS (if indicated):     Assets:   Communication Skills Desire for Improvement Physical Health  Sleep:  Number of Hours: 6.5   Current Medications: Current Facility-Administered Medications  Medication Dose Route Frequency Provider Last Rate Last Dose  . acetaminophen (TYLENOL) tablet 650 mg  650 mg Oral Q6H PRN Kerry Hough, PA-C      . alum & mag hydroxide-simeth (MAALOX/MYLANTA) 200-200-20 MG/5ML suspension 30 mL  30 mL Oral Q4H PRN Kerry Hough, PA-C      . buPROPion (WELLBUTRIN XL) 24 hr tablet 150 mg  150 mg Oral Daily Nehemiah Settle, MD   150 mg at 11/16/12 0755  . clonazePAM (KLONOPIN) tablet 1 mg  1 mg Oral BID Nehemiah Settle, MD      . dicyclomine (BENTYL) capsule 10 mg  10 mg Oral TID PRN Kerry Hough, PA-C      . hydrOXYzine (ATARAX/VISTARIL) tablet 25 mg  25 mg Oral Q6H PRN Kerry Hough, PA-C      . magnesium hydroxide (MILK OF MAGNESIA) suspension 30 mL  30 mL Oral Daily PRN Kerry Hough, PA-C      . metoprolol succinate (TOPROL-XL) 24 hr tablet 25 mg  25 mg Oral Daily Kerry Hough, PA-C   25 mg at 11/16/12 0754  . pantoprazole (PROTONIX) EC tablet 40 mg  40 mg Oral Daily Kerry Hough, PA-C   40 mg at 11/16/12 0754  . risperiDONE (RISPERDAL) tablet 1 mg  1 mg Oral BID Nehemiah Settle, MD   1 mg at 11/16/12 0755  . [START ON 11/17/2012] topiramate (TOPAMAX) tablet 200 mg  200 mg Oral  Daily Nehemiah Settle, MD      . traZODone (DESYREL) tablet 50 mg  50 mg Oral QHS,MR X 1 Kerry Hough, PA-C   50 mg at 11/15/12 0221    Lab Results:  Results for orders placed during the hospital encounter of 11/15/12 (from the past 48 hour(s))  CBC     Status: None   Collection Time    11/15/12  6:20 AM      Result Value Range   WBC 6.6  4.0 - 10.5 K/uL   RBC 3.98  3.87 - 5.11 MIL/uL   Hemoglobin 12.5  12.0 - 15.0 g/dL   HCT 47.8  29.5 - 62.1 %   MCV 93.5  78.0 - 100.0 fL   MCH 31.4  26.0 - 34.0 pg   MCHC 33.6  30.0 - 36.0 g/dL   RDW 30.8  65.7 - 84.6 %    Platelets 255  150 - 400 K/uL  COMPREHENSIVE METABOLIC PANEL     Status: Abnormal   Collection Time    11/15/12  6:20 AM      Result Value Range   Sodium 142  135 - 145 mEq/L   Potassium 3.7  3.5 - 5.1 mEq/L   Chloride 103  96 - 112 mEq/L   CO2 22  19 - 32 mEq/L   Glucose, Bld 92  70 - 99 mg/dL   BUN 7  6 - 23 mg/dL   Creatinine, Ser 9.62  0.50 - 1.10 mg/dL   Calcium 8.9  8.4 - 95.2 mg/dL   Total Protein 7.0  6.0 - 8.3 g/dL   Albumin 3.8  3.5 - 5.2 g/dL   AST 22  0 - 37 U/L   ALT 13  0 - 35 U/L   Alkaline Phosphatase 77  39 - 117 U/L   Total Bilirubin 0.3  0.3 - 1.2 mg/dL   GFR calc non Af Amer 88 (*) >90 mL/min   GFR calc Af Amer >90  >90 mL/min   Comment:            The eGFR has been calculated     using the CKD EPI equation.     This calculation has not been     validated in all clinical     situations.     eGFR's persistently     <90 mL/min signify     possible Chronic Kidney Disease.  TSH     Status: None   Collection Time    11/15/12  6:20 AM      Result Value Range   TSH 1.303  0.350 - 4.500 uIU/mL    Physical Findings: AIMS: Facial and Oral Movements Muscles of Facial Expression: None, normal Lips and Perioral Area: None, normal Jaw: None, normal Tongue: None, normal,Extremity Movements Upper (arms, wrists, hands, fingers): None, normal Lower (legs, knees, ankles, toes): None, normal, Trunk Movements Neck, shoulders, hips: None, normal, Overall Severity Severity of abnormal movements (highest score from questions above): None, normal Incapacitation due to abnormal movements: None, normal Patient's awareness of abnormal movements (rate only patient's report): No Awareness, Dental Status Current problems with teeth and/or dentures?: No Does patient usually wear dentures?: No  CIWA:  CIWA-Ar Total: 2 COWS:     Treatment Plan Summary: Daily contact with patient to assess and evaluate symptoms and progress in treatment Medication  management  Plan: Increase Topamax 200 mg a day for controlling mood swings Increase clonazepam to 1 mg twice daily for better controlling  anxiety 1. Continue for crisis management and stabilization. 2. Medication management to reduce current symptoms to base line and improve the patient's overall level of functioning. 3. Treat health problems as indicated. 4. Develop treatment plan to decrease risk of relapse upon discharge and to reduce the need for readmission. 5. Psycho-social education regarding relapse prevention and self care. 6. Health care follow up as needed for medical problems. 7. Restart home medications where appropriate. 8. Disposition plans in progress   Medical Decision Making Problem Points:  Established problem, worsening (2) and Review of psycho-social stressors (1) Data Points:  Review or order clinical lab tests (1) Review of medication regiment & side effects (2) Review of new medications or change in dosage (2)  I certify that inpatient services furnished can reasonably be expected to improve the patient's condition.   Nehemiah Settle., M.D. 11/16/2012, 11:54 AM

## 2012-11-17 LAB — URINALYSIS, ROUTINE W REFLEX MICROSCOPIC
Ketones, ur: NEGATIVE mg/dL
Leukocytes, UA: NEGATIVE
Nitrite: NEGATIVE
Protein, ur: NEGATIVE mg/dL
pH: 6 (ref 5.0–8.0)

## 2012-11-17 LAB — PREGNANCY, URINE: Preg Test, Ur: NEGATIVE

## 2012-11-17 MED ORDER — CLONAZEPAM 0.5 MG PO TABS
0.5000 mg | ORAL_TABLET | Freq: Two times a day (BID) | ORAL | Status: DC
  Administered 2012-11-17 – 2012-11-19 (×4): 0.5 mg via ORAL
  Filled 2012-11-17 (×5): qty 1

## 2012-11-17 MED ORDER — TOPIRAMATE 100 MG PO TABS
100.0000 mg | ORAL_TABLET | Freq: Every day | ORAL | Status: DC
  Administered 2012-11-18 – 2012-11-22 (×5): 100 mg via ORAL
  Filled 2012-11-17 (×2): qty 1
  Filled 2012-11-17: qty 14
  Filled 2012-11-17 (×4): qty 1

## 2012-11-17 MED ORDER — RISPERIDONE 0.5 MG PO TABS
0.5000 mg | ORAL_TABLET | Freq: Two times a day (BID) | ORAL | Status: DC
  Administered 2012-11-17 – 2012-11-21 (×7): 0.5 mg via ORAL
  Filled 2012-11-17 (×12): qty 1

## 2012-11-17 NOTE — Progress Notes (Signed)
 .  Psychoeducational Group Note    Date: 11/17/2012 Time: 0930   Goal Setting Purpose of Group: To be able to set a goal that is measurable and that can be accomplished in one day Participation Level:  Active  Participation Quality:  Appropriate and Attentive  Affect:  Appropriate  Cognitive:  Appropriate  Insight:  Improving  Engagement in Group:  Improving  Additional Comments:    Khalis Hittle A 

## 2012-11-17 NOTE — Progress Notes (Signed)
D) Pt is very unhappy that her medications were changed. Stated that she had talked with Dr. Shela Commons today and he increased her medications and another practitioner came and changed it back to the original dose. When Pt was explaining this she agitated stating that "you all don't know what you are doing". Also upset that she cannot get in touch with her son, who she kept referring to as "my brother", in the 1:1/ A) Active listening provided. Encouraged to continue to verbalize her feelings. Encouraged to speak with the doctor tomorrow.  R) Pt rates her depression and her hopelessness both at a 5. Denies SI and HI.

## 2012-11-17 NOTE — Progress Notes (Signed)
[facility] MD Progress Note  11/17/2012 1:32 PM Karen [name]  MRN:  [id number]  Subjective:  Patient has been depressed and confused on assessment for situation, medications reduced due to slowing of responses and confusion--Topamax, Risperdal, and Klonopin reduced.  Depression low but anxious about not being able to get in touch with her son, who is moving today.  She is worried about their relationship due to her and his girlfriend not getting along.   Diagnosis:   Axis I: Anxiety Disorder NOS, Bipolar, Depressed and Post Traumatic Stress Disorder Axis II: Deferred Axis III:  Past Medical History  Diagnosis Date  . Hypertension   . Migraine   . IBS (irritable bowel syndrome)   . Bipolar affective disorder   . PTSD (post-traumatic stress disorder)   . Pulmonary embolism   . DVT (deep venous thrombosis)   . Bowel obstruction    Axis IV: other psychosocial or environmental problems, problems related to social environment and problems with primary support group Axis V: 41-50 serious symptoms  ADL's:  Intact  Sleep: Fair  Appetite:  Fair  Suicidal Ideation:  Denies Homicidal Ideation:  Denies  Psychiatric Specialty Exam: Review of Systems  Constitutional: Negative.   HENT: Negative.   Eyes: Negative.   Respiratory: Negative.   Cardiovascular: Negative.   Gastrointestinal: Negative.   Genitourinary: Negative.   Musculoskeletal: Negative.   Skin: Negative.   Neurological: Negative.   Endo/Heme/Allergies: Negative.   Psychiatric/Behavioral: Positive for depression. The patient is nervous/anxious.     Blood pressure 115/82, pulse 101, temperature 97.3 F (36.3 C), temperature source Oral, resp. rate 16, height 5\' 4"  (1.626 m), weight 80.74 kg (178 lb).Body mass index is 30.54 kg/(m^2).  General Appearance: Casual  Eye Contact::  Fair  Speech:  Normal Rate  Volume:  Normal  Mood:  Depressed  Affect:  Congruent  Thought Process:  Coherent  Orientation:  Full (Time, Place,  and Person)  Thought Content:  WDL  Suicidal Thoughts:  No  Homicidal Thoughts:  No  Memory:  Immediate;   Fair Recent;   Fair Remote;   Fair  Judgement:  Fair  Insight:  Fair  Psychomotor Activity:  Decreased  Concentration:  Fair  Recall:  Fair  Akathisia:  No  Handed:  Right  AIMS (if indicated):     Assets:  Communication Skills Physical Health Resilience  Sleep:  Number of Hours: 5.75   Current Medications: Current Facility-Administered Medications  Medication Dose Route Frequency Provider Last Rate Last Dose  . acetaminophen (TYLENOL) tablet 650 mg  650 mg Oral Q6H PRN Kerry Hough, PA-C      . alum & mag hydroxide-simeth (MAALOX/MYLANTA) 200-200-20 MG/5ML suspension 30 mL  30 mL Oral Q4H PRN Kerry Hough, PA-C      . buPROPion (WELLBUTRIN XL) 24 hr tablet 150 mg  150 mg Oral Daily Nehemiah Settle, MD   150 mg at 11/17/12 0831  . clonazePAM (KLONOPIN) tablet 1 mg  1 mg Oral BID Nehemiah Settle, MD   1 mg at 11/17/12 0865  . dicyclomine (BENTYL) capsule 10 mg  10 mg Oral TID PRN Kerry Hough, PA-C      . hydrOXYzine (ATARAX/VISTARIL) tablet 25 mg  25 mg Oral Q6H PRN Kerry Hough, PA-C      . magnesium hydroxide (MILK OF MAGNESIA) suspension 30 mL  30 mL Oral Daily PRN Kerry Hough, PA-C      . metoprolol succinate (TOPROL-XL) 24 hr tablet 25 mg  25 mg Oral Daily Kerry Hough, PA-C   25 mg at 11/17/12 0831  . pantoprazole (PROTONIX) EC tablet 40 mg  40 mg Oral Daily Kerry Hough, PA-C   40 mg at 11/17/12 0831  . risperiDONE (RISPERDAL) tablet 1 mg  1 mg Oral BID Nehemiah Settle, MD   1 mg at 11/17/12 0831  . topiramate (TOPAMAX) tablet 200 mg  200 mg Oral Daily Nehemiah Settle, MD   200 mg at 11/17/12 0830  . traZODone (DESYREL) tablet 50 mg  50 mg Oral QHS,MR X 1 Spencer E Simon, PA-C   50 mg at 11/15/12 0221    Lab Results: No results found for this or any previous visit (from the past 48 hour(s)).  Physical  Findings: AIMS: Facial and Oral Movements Muscles of Facial Expression: None, normal Lips and Perioral Area: None, normal Jaw: None, normal Tongue: None, normal,Extremity Movements Upper (arms, wrists, hands, fingers): None, normal Lower (legs, knees, ankles, toes): None, normal, Trunk Movements Neck, shoulders, hips: None, normal, Overall Severity Severity of abnormal movements (highest score from questions above): None, normal Incapacitation due to abnormal movements: None, normal Patient's awareness of abnormal movements (rate only patient's report): No Awareness, Dental Status Current problems with teeth and/or dentures?: No Does patient usually wear dentures?: No  CIWA:  CIWA-Ar Total: 2 COWS:     Treatment Plan Summary: Daily contact with patient to assess and evaluate symptoms and progress in treatment Medication management  Plan:  Review of chart, vital signs, medications, and notes. 1-Individual and group therapy 2-Medication management for depression, mood stability, and anxiety:  Medications reviewed with the patient and due to some confusion, Risperdal-Klonopin-Topamax reduced 3-Coping skills for depression, anxiety, and mood stability 4-Continue crisis stabilization and management 5-Address health issues--monitoring vital signs, stable 6-Treatment plan in progress to prevent relapse of depression and anxiety  Medical Decision Making Problem Points:  Established problem, stable/improving (1) and Review of psycho-social stressors (1) Data Points:  Review of new medications or change in dosage (2)  I certify that inpatient services furnished can reasonably be expected to improve the patient's condition.   Nanine Means, PMH-NP 11/17/2012, 1:32 PM  Reviewed the information documented and agree with the treatment plan.  Tarrence Enck,JANARDHAHA R. 11/19/2012 12:14 PM

## 2012-11-17 NOTE — [facility] Group Notes (Signed)
[facility] Group Notes:  (Clinical Social Work)  11/17/2012   3:00-4:00PM  Summary of Progress/Problems:   The main focus of today's process group was for the patient to identify ways in which they have sabotaged their own mental health wellness/recovery.  Motivational interviewing was used to explore the reasons they engage in this behavior, and reasons they may have for wanting to change.  The Stages of Change were explained to the group using a handout, and patients identified where they are with regard to changing self-defeating behaviors.  The patient expressed total opposition to talking in group each time she was called on, but she did stay and listen.  Type of Therapy:  Process Group  Participation Level:  Active  Participation Quality:  Attentive and Resistant  Affect:  Depressed and Flat  Cognitive:  Oriented  Insight:  Limited  Engagement in Therapy:  Limited  Modes of Intervention:  Education, Motivational Interviewing   Ambrose Mantle, LCSW 11/17/2012, 5:46 PM

## 2012-11-17 NOTE — Progress Notes (Signed)
Psychoeducational Group Note  Date: 11/17/2012 Time:  1015  Group Topic/Focus:  Identifying Needs:   The focus of this group is to help patients identify their personal needs that have been historically problematic and identify healthy behaviors to address their needs.  Participation Level:  Active  Participation Quality:  Appropriate  Affect:  Appropriate  Cognitive:  Appropriate  Insight:  Improving  Engagement in Group:  Engaged  Additional Comments:    Dione Housekeeper

## 2012-11-17 NOTE — Progress Notes (Signed)
Adult Psychoeducational Group Note  Date:  11/17/2012 Time:  800pm  Group Topic/Focus:  Wrap-Up Group:   The focus of this group is to help patients review their daily goal of treatment and discuss progress on daily workbooks.  Participation Level:  Active  Participation Quality:  Appropriate  Affect:  Appropriate  Cognitive:  Appropriate  Insight: Appropriate  Engagement in Group:  Engaged  Modes of Intervention:  Discussion  Additional Comments:  Pt reported she dis not have a good day today.  Pt expressed that she tried to deal with a particular situation but did not wish to provide additional information.    Marvis Moeller A 11/17/2012, 9:52 PM

## 2012-11-17 NOTE — Progress Notes (Signed)
Patient ID: Karen [name], female   DOB: 12/01/1970, 42 y.o.   MRN: [id number] D)  Was rather quiet this evening, interacting with select peers, attended group but had minimal participation.  No c/o's voiced, rather isolative, answers are minimal. A)  Will continue to monitor , support, continue to develop therapeutic rapport. R)  Safety maintained.

## 2012-11-17 NOTE — Progress Notes (Signed)
Adult Psychoeducational Group Note  Date:  11/17/2012 Time:  2:32 PM  Group Topic/Focus:  Healthy Communication:   The focus of this group is to discuss communication, barriers to communication, as well as healthy ways to communicate with others.  Participation Level:  None  Participation Quality:  Drowsy  Affect:  Lethargic  Cognitive:  Confused  Insight: None  Engagement in Group:  None  Modes of Intervention:  Discussion, Socialization and Support  Additional Comments:  Pt was drowsy and did not engage in group. Upon group ending Pt searched her pockets for her cell phone and was reminded that it was locked up and safe. Pt seem confused and not aware of her surroundings.   Tora Perches N 11/17/2012, 2:32 PM

## 2012-11-18 NOTE — [facility] Group Notes (Signed)
[facility] Group Notes: (Clinical Social Work)   11/18/2012      Type of Therapy:  Group Therapy   Participation Level:  Did Not Attend    Ritvik Mczeal Grossman-Orr, LCSW 11/18/2012, 5:11 PM     

## 2012-11-18 NOTE — Progress Notes (Signed)
D) Pt has been angry and frustrated. Has sat in groups and has paid attention, but will not engage verbally. When asked if she were going to partisipate, Pt shook her head, no.  Pt upset over the fact she cannot get in touch with her son and upset over the fact that her meds were changed yesterday by the doctor and then by the NP. Wrote on her self inventory that she was just going to keep her mouth shut and not say anything. Pt has poor dye contact A) Attempts at a 1:1 was made several times, along with attempts at encouragement and praise. R) Pt is not responding due to anger

## 2012-11-18 NOTE — Progress Notes (Addendum)
Psychoeducational Group Note  Date:  11/18/2012 Time:  1015  Group Topic/Focus:  Making Healthy Choices:   The focus of this group is to help patients identify negative/unhealthy choices they were using prior to admission and identify positive/healthier coping strategies to replace them upon discharge.  Participation Level: Did not attend  Dione Housekeeper 11/18/2012

## 2012-11-18 NOTE — Progress Notes (Signed)
Patient ID: Karen [name], female   DOB: 1970-10-02, 42 y.o.   MRN: [id number] [facility] MD Progress Note  11/18/2012 1:40 PM Aylana Hirschfeld  MRN:  [id number]  Subjective:  Patient continued to be confused, depressed and anxious. Patient stated that I don't feel right and confused and and the feeding/she needed to go without the disposition plans. Patient medications were adjusted because slowing of responses and confusion yesterday--Topamax, Risperdal, and Klonopin.  Patient feels anxious about not being able to get in touch with her son, who is moving away.  She is worried about their relationship due to her and his girlfriend not getting along. Patient has a poor insight and judgment and the known for noncompliance with her medication management.  Diagnosis:   Axis I: Anxiety Disorder NOS, Bipolar, Depressed and Post Traumatic Stress Disorder Axis II: Deferred Axis III:  Past Medical History  Diagnosis Date  . Hypertension   . Migraine   . IBS (irritable bowel syndrome)   . Bipolar affective disorder   . PTSD (post-traumatic stress disorder)   . Pulmonary embolism   . DVT (deep venous thrombosis)   . Bowel obstruction    Axis IV: other psychosocial or environmental problems, problems related to social environment and problems with primary support group Axis V: 41-50 serious symptoms  ADL's:  Intact  Sleep: Fair  Appetite:  Fair  Suicidal Ideation:  Denies Homicidal Ideation:  Denies  Psychiatric Specialty Exam: Review of Systems  Constitutional: Negative.   HENT: Negative.   Eyes: Negative.   Respiratory: Negative.   Cardiovascular: Negative.   Gastrointestinal: Negative.   Genitourinary: Negative.   Musculoskeletal: Negative.   Skin: Negative.   Neurological: Negative.   Endo/Heme/Allergies: Negative.   Psychiatric/Behavioral: Positive for depression. The patient is nervous/anxious.     Blood pressure 98/67, pulse 108, temperature 97.4 F (36.3 C), temperature source Oral,  resp. rate 16, height 5\' 4"  (1.626 m), weight 80.74 kg (178 lb).Body mass index is 30.54 kg/(m^2).  General Appearance: Casual  Eye Contact::  Fair  Speech:  Normal Rate  Volume:  Normal  Mood:  Depressed  Affect:  Congruent  Thought Process:  Coherent  Orientation:  Full (Time, Place, and Person)  Thought Content:  WDL  Suicidal Thoughts:  No  Homicidal Thoughts:  No  Memory:  Immediate;   Fair Recent;   Fair Remote;   Fair  Judgement:  Fair  Insight:  Fair  Psychomotor Activity:  Decreased  Concentration:  Fair  Recall:  Fair  Akathisia:  No  Handed:  Right  AIMS (if indicated):     Assets:  Communication Skills Physical Health Resilience  Sleep:  Number of Hours: 6.75   Current Medications: Current Facility-Administered Medications  Medication Dose Route Frequency Provider Last Rate Last Dose  . acetaminophen (TYLENOL) tablet 650 mg  650 mg Oral Q6H PRN Kerry Hough, PA-C      . alum & mag hydroxide-simeth (MAALOX/MYLANTA) 200-200-20 MG/5ML suspension 30 mL  30 mL Oral Q4H PRN Kerry Hough, PA-C      . buPROPion (WELLBUTRIN XL) 24 hr tablet 150 mg  150 mg Oral Daily Nehemiah Settle, MD   150 mg at 11/18/12 0858  . clonazePAM (KLONOPIN) tablet 0.5 mg  0.5 mg Oral BID Nanine Means, NP   0.5 mg at 11/18/12 0858  . dicyclomine (BENTYL) capsule 10 mg  10 mg Oral TID PRN Kerry Hough, PA-C      . hydrOXYzine (ATARAX/VISTARIL) tablet 25 mg  25 mg Oral Q6H PRN Kerry Hough, PA-C   25 mg at 11/17/12 2126  . magnesium hydroxide (MILK OF MAGNESIA) suspension 30 mL  30 mL Oral Daily PRN Kerry Hough, PA-C      . metoprolol succinate (TOPROL-XL) 24 hr tablet 25 mg  25 mg Oral Daily Kerry Hough, PA-C   25 mg at 11/18/12 0858  . pantoprazole (PROTONIX) EC tablet 40 mg  40 mg Oral Daily Kerry Hough, PA-C   40 mg at 11/18/12 0858  . risperiDONE (RISPERDAL) tablet 0.5 mg  0.5 mg Oral BID Nanine Means, NP   0.5 mg at 11/18/12 0858  . topiramate (TOPAMAX)  tablet 100 mg  100 mg Oral Daily Nanine Means, NP   100 mg at 11/18/12 0858  . traZODone (DESYREL) tablet 50 mg  50 mg Oral QHS,MR X 1 Kerry Hough, PA-C   50 mg at 11/15/12 0221    Lab Results:  Results for orders placed during the hospital encounter of 11/15/12 (from the past 48 hour(s))  PREGNANCY, URINE     Status: None   Collection Time    11/17/12  6:25 AM      Result Value Range   Preg Test, Ur NEGATIVE  NEGATIVE   Comment:            THE SENSITIVITY OF THIS     METHODOLOGY IS >20 mIU/mL.  URINALYSIS, ROUTINE W REFLEX MICROSCOPIC     Status: None   Collection Time    11/17/12  6:25 AM      Result Value Range   Color, Urine YELLOW  YELLOW   APPearance CLEAR  CLEAR   Specific Gravity, Urine 1.015  1.005 - 1.030   pH 6.0  5.0 - 8.0   Glucose, UA NEGATIVE  NEGATIVE mg/dL   Hgb urine dipstick NEGATIVE  NEGATIVE   Bilirubin Urine NEGATIVE  NEGATIVE   Ketones, ur NEGATIVE  NEGATIVE mg/dL   Protein, ur NEGATIVE  NEGATIVE mg/dL   Urobilinogen, UA 0.2  0.0 - 1.0 mg/dL   Nitrite NEGATIVE  NEGATIVE   Leukocytes, UA NEGATIVE  NEGATIVE   Comment: MICROSCOPIC NOT DONE ON URINES WITH NEGATIVE PROTEIN, BLOOD, LEUKOCYTES, NITRITE, OR GLUCOSE <1000 mg/dL.    Physical Findings: AIMS: Facial and Oral Movements Muscles of Facial Expression: None, normal Lips and Perioral Area: None, normal Jaw: None, normal Tongue: None, normal,Extremity Movements Upper (arms, wrists, hands, fingers): None, normal Lower (legs, knees, ankles, toes): None, normal, Trunk Movements Neck, shoulders, hips: None, normal, Overall Severity Severity of abnormal movements (highest score from questions above): None, normal Incapacitation due to abnormal movements: None, normal Patient's awareness of abnormal movements (rate only patient's report): No Awareness, Dental Status Current problems with teeth and/or dentures?: No Does patient usually wear dentures?: No  CIWA:  CIWA-Ar Total: 2 COWS:     Treatment  Plan Summary: Daily contact with patient to assess and evaluate symptoms and progress in treatment Medication management  Plan:  Review of chart, vital signs, medications, and notes. 1-Individual and group therapy 2-Medication management for depression, mood stability, and anxiety:  Continue  Risperdal-Klonopin-Topamax and wellbutrin 3-Coping skills for depression, anxiety, and mood stability 4-Continue crisis stabilization and management 5-Address health issues--monitoring vital signs, stable 6-Treatment plan in progress to prevent relapse of depression and anxiety  Medical Decision Making Problem Points:  Established problem, stable/improving (1) and Review of psycho-social stressors (1) Data Points:  Review of new medications or change in dosage (2)  I certify that inpatient services furnished can reasonably be expected to improve the patient's condition.   Nehemiah Settle., M.D. 11/18/2012, 1:40 PM

## 2012-11-19 MED ORDER — CLONAZEPAM 1 MG PO TABS
1.0000 mg | ORAL_TABLET | Freq: Every day | ORAL | Status: DC
  Administered 2012-11-20 – 2012-11-21 (×2): 1 mg via ORAL
  Filled 2012-11-19 (×2): qty 1

## 2012-11-19 NOTE — [facility] Group Notes (Signed)
[facility] LCSW Aftercare Discharge Planning Group Note   11/19/2012 8:45 AM  Participation Quality:  Alert and Appropriate   Mood/Affect:  Appropriate, Flat and Depressed   Depression Rating:  5  Anxiety Rating:  5  Thoughts of Suicide:  Pt denies SI/HI  Will you contract for safety?   Yes  Current AVH:  Pt denies  Plan for Discharge/Comments:  Pt attended discharge planning group and actively participated in group.  CSW provided pt with today's workbook.  Pt reports not havnig a good weekend due to dealing with family and her medications.  Pt is concerned about getting her belongings from her son.  CSW to assess for appropriate follow up.  No further needs voiced by pt at this time.    Transportation Means: Pt reports access to transportation  Supports: No supports mentioned at this time  Karen [name], LCSWA 11/19/2012 10:27 AM

## 2012-11-19 NOTE — Progress Notes (Signed)
Patient ID: Karen [name], female   DOB: 01/26/1971, 42 y.o.   MRN: [id number] [facility] MD Progress Note  11/19/2012 1:38 PM Karen [name]  MRN:  [id number]  Subjective:  Patient stated today is pretty good day, did not sleep last night due to medication did not work. She has anxiety is continues and supported by RN, given klonopin and risperidone and slept only 2 hours. She continue to be depression and anxious. Patient stated that trazodone makes her groggy next day and feels klonopin helps her sleep better.  I don't feel confused and has been talking with social service regarding disposition plans. She has her son cosigned into an apartment at [employer] on [address] and she has no communication with her son. Patient requested to adjust her medication both risperidone and klonopin for better control. Her son has moved to [address].   Diagnosis:   Axis I: Anxiety Disorder NOS, Bipolar, Depressed and Post Traumatic Stress Disorder Axis II: Deferred Axis III:  Past Medical History  Diagnosis Date  . Hypertension   . Migraine   . IBS (irritable bowel syndrome)   . Bipolar affective disorder   . PTSD (post-traumatic stress disorder)   . Pulmonary embolism   . DVT (deep venous thrombosis)   . Bowel obstruction    Axis IV: other psychosocial or environmental problems, problems related to social environment and problems with primary support group Axis V: 41-50 serious symptoms  ADL's:  Intact  Sleep: Fair  Appetite:  Fair  Suicidal Ideation:  Denies Homicidal Ideation:  Denies  Psychiatric Specialty Exam: Review of Systems  Constitutional: Negative.   HENT: Negative.   Eyes: Negative.   Respiratory: Negative.   Cardiovascular: Negative.   Gastrointestinal: Negative.   Genitourinary: Negative.   Musculoskeletal: Negative.   Skin: Negative.   Neurological: Negative.   Endo/Heme/Allergies: Negative.   Psychiatric/Behavioral: Positive for depression. The patient is  nervous/anxious.     Blood pressure 109/78, pulse 89, temperature 97.6 F (36.4 C), temperature source Oral, resp. rate 16, height 5\' 4"  (1.626 m), weight 80.74 kg (178 lb).Body mass index is 30.54 kg/(m^2).  General Appearance: Casual  Eye Contact::  Fair  Speech:  Normal Rate  Volume:  Normal  Mood:  Depressed  Affect:  Congruent  Thought Process:  Coherent  Orientation:  Full (Time, Place, and Person)  Thought Content:  WDL  Suicidal Thoughts:  No  Homicidal Thoughts:  No  Memory:  Immediate;   Fair Recent;   Fair Remote;   Fair  Judgement:  Fair  Insight:  Fair  Psychomotor Activity:  Decreased  Concentration:  Fair  Recall:  Fair  Akathisia:  No  Handed:  Right  AIMS (if indicated):     Assets:  Communication Skills Physical Health Resilience  Sleep:  Number of Hours: 4   Current Medications: Current Facility-Administered Medications  Medication Dose Route Frequency Provider Last Rate Last Dose  . acetaminophen (TYLENOL) tablet 650 mg  650 mg Oral Q6H PRN Kerry Hough, PA-C      . alum & mag hydroxide-simeth (MAALOX/MYLANTA) 200-200-20 MG/5ML suspension 30 mL  30 mL Oral Q4H PRN Kerry Hough, PA-C      . buPROPion (WELLBUTRIN XL) 24 hr tablet 150 mg  150 mg Oral Daily Nehemiah Settle, MD   150 mg at 11/19/12 0807  . clonazePAM (KLONOPIN) tablet 0.5 mg  0.5 mg Oral BID Nanine Means, NP   0.5 mg at 11/19/12 0807  . dicyclomine (BENTYL)  capsule 10 mg  10 mg Oral TID PRN Kerry Hough, PA-C      . hydrOXYzine (ATARAX/VISTARIL) tablet 25 mg  25 mg Oral Q6H PRN Kerry Hough, PA-C   25 mg at 11/17/12 2126  . magnesium hydroxide (MILK OF MAGNESIA) suspension 30 mL  30 mL Oral Daily PRN Kerry Hough, PA-C      . metoprolol succinate (TOPROL-XL) 24 hr tablet 25 mg  25 mg Oral Daily Kerry Hough, PA-C   25 mg at 11/19/12 1610  . pantoprazole (PROTONIX) EC tablet 40 mg  40 mg Oral Daily Kerry Hough, PA-C   40 mg at 11/19/12 9604  . risperiDONE  (RISPERDAL) tablet 0.5 mg  0.5 mg Oral BID Nanine Means, NP   0.5 mg at 11/19/12 0808  . topiramate (TOPAMAX) tablet 100 mg  100 mg Oral Daily Nanine Means, NP   100 mg at 11/19/12 5409  . traZODone (DESYREL) tablet 50 mg  50 mg Oral QHS,MR X 1 Kerry Hough, PA-C   50 mg at 11/18/12 2136    Lab Results:  No results found for this or any previous visit (from the past 48 hour(s)).  Physical Findings: AIMS: Facial and Oral Movements Muscles of Facial Expression: None, normal Lips and Perioral Area: None, normal Jaw: None, normal Tongue: None, normal,Extremity Movements Upper (arms, wrists, hands, fingers): None, normal Lower (legs, knees, ankles, toes): None, normal, Trunk Movements Neck, shoulders, hips: None, normal, Overall Severity Severity of abnormal movements (highest score from questions above): None, normal Incapacitation due to abnormal movements: None, normal Patient's awareness of abnormal movements (rate only patient's report): No Awareness, Dental Status Current problems with teeth and/or dentures?: No Does patient usually wear dentures?: No  CIWA:  CIWA-Ar Total: 1 COWS:  COWS Total Score: 2  Treatment Plan Summary: Daily contact with patient to assess and evaluate symptoms and progress in treatment Medication management  Plan:   Change Klonopin 1 mg PO Qhs Continue Risperidal 0.5 mg BID Review of chart, vital signs, medications, and notes. 1-Individual and group therapy 2-Medication management for depression, mood stability, and anxiety:  Continue  Risperdal-Klonopin-Topamax and wellbutrin 3-Coping skills for depression, anxiety, and mood stability 4-Continue crisis stabilization and management 5-Address health issues--monitoring vital signs, stable 6-Treatment plan in progress to prevent relapse of depression and anxiety 7. Disposition plans in progress  Medical Decision Making Problem Points:  Established problem, stable/improving (1) and Review of  psycho-social stressors (1) Data Points:  Review of new medications or change in dosage (2)  I certify that inpatient services furnished can reasonably be expected to improve the patient's condition.   Nehemiah Settle., M.D. 11/19/2012, 1:38 PM
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
## 2012-11-19 NOTE — Tx Team (Signed)
Interdisciplinary Treatment Plan Update (Adult)  Date: 11/19/2012  Time Reviewed:  9:45 AM  Progress in Treatment: Attending groups: Yes Participating in groups:  Yes Taking medication as prescribed:  Yes Tolerating medication:  Yes Family/Significant othe contact made: CSW assessing  Patient understands diagnosis:  Yes Discussing patient identified problems/goals with staff:  Yes Medical problems stabilized or resolved:  Yes Denies suicidal/homicidal ideation: Yes Issues/concerns per patient self-inventory:  Yes Other:  New problem(s) identified: N/A  Discharge Plan or Barriers: CSW assessing for appropriate referrals.  Reason for Continuation of Hospitalization: Anxiety Depression Medication Stabilization  Comments: N/A  Estimated length of stay: 3-5 days  For review of initial/current patient goals, please see plan of care.  Attendees: Patient:     Family:     Physician:  Dr. Johnalagadda 11/19/2012 12:15 PM   Nursing:   Brittany Tyson, RN 11/19/2012 12:15 PM   Clinical Social Worker:  Rachyl Wuebker Horton, LCSWA 11/19/2012 12:15 PM   Other: Neil Mashburn, PA  11/19/2012 12:15 PM   Other:  Maseta Dorley, MA care coordination 11/19/2012 12:15 PM   Other:  Beverly Knight, RN 11/19/2012 12:15 PM   Other:  Carol Davis, RN 11/19/2012 12:15 PM  Other:    Other:    Other:    Other:    Other:    Other:     Scribe for Treatment Team:   Horton, Keric Zehren Nicole, 11/19/2012 12:15 PM    

## 2012-11-19 NOTE — Progress Notes (Signed)
Adult Psychoeducational Group Note  Date:  11/19/2012 Time:  10:26 PM  Group Topic/Focus:  Goals Group:   The focus of this group is to help patients establish daily goals to achieve during treatment and discuss how the patient can incorporate goal setting into their daily lives to aide in recovery.  Participation Level:  Active  Participation Quality:  Appropriate  Affect:  Appropriate  Cognitive:  Appropriate  Insight: Appropriate  Engagement in Group:  Engaged  Modes of Intervention:  Discussion  Additional Comments:  Pt stated that the day was so so, she felt that the staff lied to her about some things, but she worked on some outside stressors and that made her day go by better.  Terie Purser R 11/19/2012, 10:26 PM

## 2012-11-19 NOTE — Progress Notes (Signed)
D:  Patient's self inventory sheet, patient needs sleep meds, normal energy level, improving attention span.  Rated depression and hopelessness #7.  Denied withdrawals.  Denied SI.  Denied physical problems.  Pain goal #1, worst pain #3.  After discharge changes, "worry about myself".  Questions for staff:  "That I can take care of myself.  I just need to know if my wallet is in my purse."  No plans for discharge at this time.  No problems taking meds after discharge. A:  Medications administered per MD orders.  Emotional support and encouragement given patient. R:  Denied SI and HI.   Denied A/V hallucinations.  Denied pain.  Will continue 15 minute checks for safety.  Safety maintained.

## 2012-11-19 NOTE — Progress Notes (Signed)
Adult Psychoeducational Group Note  Date:  11/19/2012 Time:  12:00 PM  Group Topic/Focus:  Wellness Toolbox:   The focus of this group is to discuss various aspects of wellness, balancing those aspects and exploring ways to increase the ability to experience wellness.  Patients will create a wellness toolbox for use upon discharge.  Participation Level:  Active  Participation Quality:  Appropriate  Affect:  Appropriate  Cognitive:  Appropriate  Insight: Appropriate  Engagement in Group:  Engaged  Modes of Intervention:  Clarification, Discussion and Education  Additional Comments:  Pt attended and actively participated in group with MHT. Pt discussed her goals for today and issues with family and medications.   Lorin Mercy 11/19/2012, 12:00 PM

## 2012-11-19 NOTE — Progress Notes (Signed)
Psychoeducational Group Note  Date:  11/18/2012 Time:  2000  Group Topic/Focus:  Wrap-Up Group:   The focus of this group is to help patients review their daily goal of treatment and discuss progress on daily workbooks.  Participation Level: Did Not Attend  Participation Quality:  Not Applicable  Affect:  Not Applicable  Cognitive:  Not Applicable  Insight:  Not Applicable  Engagement in Group: Not Applicable  Additional Comments:  The patient was encouraged to attend group, but she simply lay in bed and looked at the ceiling.   Alexsis Kathman S 11/19/2012, 1:25 AM

## 2012-11-19 NOTE — [facility] Group Notes (Signed)
[facility] LCSW Group Therapy  11/19/2012  1:15 PM   Type of Therapy:  Group Therapy  Participation Level:  Did Not Attend  Prem Coykendall Horton, LCSWA 11/19/2012 2:21 PM   

## 2012-11-20 DIAGNOSIS — F4310 Post-traumatic stress disorder, unspecified: Secondary | ICD-10-CM

## 2012-11-20 DIAGNOSIS — F411 Generalized anxiety disorder: Secondary | ICD-10-CM

## 2012-11-20 DIAGNOSIS — F3130 Bipolar disorder, current episode depressed, mild or moderate severity, unspecified: Principal | ICD-10-CM

## 2012-11-20 LAB — URINE DRUGS OF ABUSE SCREEN W ALC, ROUTINE (REF LAB)
Cocaine Metabolites: NEGATIVE
Creatinine,U: 151.2 mg/dL
Ethyl Alcohol: <10 mg/dL (ref <10)
Opiate Screen, Urine: NEGATIVE
Phencyclidine (PCP): NEGATIVE

## 2012-11-20 NOTE — Progress Notes (Signed)
Adult Psychoeducational Group Note  Date:  11/20/2012 Time:  5:14 PM  Group Topic/Focus:  Rediscovering Joy:   The focus of this group is to explore various ways to relieve stress in a positive manner.  Participation Level:  None  Participation Quality:  Appropriate and Attentive  Affect:  Flat  Cognitive:  Did not share  Insight: None  Engagement in Group:  Lacking and None  Modes of Intervention:  Discussion, Education, Limit-setting, Socialization and Support  Additional Comments:  Karen [name] attended group and did not share during group time and did nod her head at times when she agreed with others. Patient was asked to define recovery and give an example of a situation where patient loss control and could no longer handle situation. Patient also did not want to share two changes she would like to change for recovery and goals with the changes.   Karen [name] 11/20/2012, 5:14 PM

## 2012-11-20 NOTE — Progress Notes (Signed)
The focus of this group is to educate the patient on the purpose and policies of crisis stabilization and provide a format to answer questions about their admission.  The group details unit policies and expectations of patients while admitted.  Patient did attend 0900 RN orientation nurse education group.  Patient actively participated, appropriate affect, alert, appropriate insight, appropriate engagement.  Patient will work on her goals for discharge.
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
## 2012-11-20 NOTE — Progress Notes (Addendum)
D:  Patient's self inventory sheet, patient has fair sleep, good appetite, normal energy level, good attention span.  Rated depression, hopelessness, anxiety #1.  Denied withdrawals.  Denied SI.   Pain goal today #1, worst pain #2.  After discharge, "To worry about myself and go to counseling and group.  That I would like to go home and what plans have you guys came up with."  No discharge plans.  No problems taking meds after discharge. A:  Medications administered per MD orders.  Emotional support and encouragement given patient. R:  Denied SI and HI, contracts for safety.   Denied A/V hallucinations.   Denied pain.  Will continue to monitor patient for safety with 15 minute checks.  Safety maintained.  Patient has not been able to contact her son since she was admitted to [facility].  Patient will continue to try to contact her son.

## 2012-11-20 NOTE — Progress Notes (Signed)
Patient attended 0900 hr RN orientation group.The focus of this group is to educate the patient on the purpose and policies of crisis stabilization and provide a format to answer questions about their admission.  The group details unit policies and expectations of patients while admitted. Patient actively participated and patient was engaging and appropriate in affect 

## 2012-11-20 NOTE — Progress Notes (Signed)
Recreation Therapy Notes  Date: 08.05.2014 Time: 2:45pm Location: [address] Dayroom  Group Topic: Animal Assisted Activities  Goal Area(s) Addresses:  Patient will interact appropriately with dog team.    Behavioral Response: Engaged, Appropriate  Education: Coping Skills, Discharge Planning  Education Outcome: Acknowledges understanding   Clinical Observations/Feedback: Dog Team: Runner, broadcasting/film/video. Patient interacted appropriately with peer, dog team, LRT and MHT. Patient brightened while interacting with dog team and peers during session. Patient was observed to smile and laugh throughout session.   Marykay Lex Alandria Butkiewicz, LRT/CTRS  Ezreal Turay L 11/20/2012 4:25 PM

## 2012-11-20 NOTE — [facility] Group Notes (Signed)
[facility] LCSW Group Therapy  11/20/2012  1:15 PM   Type of Therapy:  Group Therapy  Participation Level:  Did Not Attend - with the doctor  Karen [name], LCSWA 11/20/2012 2:56 PM
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
## 2012-11-20 NOTE — Progress Notes (Signed)
Adult Psychoeducational Group Note  Date:  11/20/2012 Time:  11:19 PM  Group Topic/Focus:  Wrap-Up Group:   The focus of this group is to help patients review their daily goal of treatment and discuss progress on daily workbooks.  Participation Level:  Minimal  Participation Quality:  Appropriate  Affect:  Flat  Cognitive:  Appropriate  Insight: Appropriate  Engagement in Group:  Limited  Modes of Intervention:  Support  Additional Comments:    Humberto Seals Monique 11/20/2012, 11:19 PM

## 2012-11-20 NOTE — Progress Notes (Signed)
Patient ID: Karen [name], female   DOB: Jul 11, 1970, 42 y.o.   MRN: [id number] [facility] MD Progress Note  11/20/2012 1:27 PM Karen [name]  MRN:  [id number]  Subjective: "I'm feeling fine today." Patient notes that she is doing well, meds are working. She notes that she is less frustrated than when she came in now that her medications are adjusted.  Diagnosis:   Axis I: Anxiety Disorder NOS, Bipolar, Depressed and Post Traumatic Stress Disorder Axis II: Deferred Axis III:  Past Medical History  Diagnosis Date  . Hypertension   . Migraine   . IBS (irritable bowel syndrome)   . Bipolar affective disorder   . PTSD (post-traumatic stress disorder)   . Pulmonary embolism   . DVT (deep venous thrombosis)   . Bowel obstruction    Axis IV: other psychosocial or environmental problems, problems related to social environment and problems with primary support group Axis V: 41-50 serious symptoms  ADL's:  Intact  Sleep: Ok  Appetite:  Fair  Suicidal Ideation:  Denies Homicidal Ideation:  Denies  Psychiatric Specialty Exam: Review of Systems  Constitutional: Negative.  Negative for fever, chills, weight loss, malaise/fatigue and diaphoresis.  HENT: Negative.  Negative for congestion and sore throat.   Eyes: Negative.  Negative for blurred vision, double vision and photophobia.  Respiratory: Negative.  Negative for cough, shortness of breath and wheezing.   Cardiovascular: Negative.  Negative for chest pain, palpitations and PND.  Gastrointestinal: Negative.  Negative for heartburn, nausea, vomiting, abdominal pain, diarrhea and constipation.  Genitourinary: Negative.   Musculoskeletal: Negative.  Negative for myalgias, joint pain and falls.  Skin: Negative.   Neurological: Negative.  Negative for dizziness, tingling, tremors, sensory change, speech change, focal weakness, seizures, loss of consciousness, weakness and headaches.  Endo/Heme/Allergies: Negative.  Negative for polydipsia. Does  not bruise/bleed easily.  Psychiatric/Behavioral: Positive for depression. Negative for suicidal ideas, hallucinations, memory loss and substance abuse. The patient is nervous/anxious. The patient does not have insomnia.     Blood pressure 108/79, pulse 105, temperature 97.8 F (36.6 C), temperature source Oral, resp. rate 18, height 5\' 4"  (1.626 m), weight 80.74 kg (178 lb).Body mass index is 30.54 kg/(m^2).  General Appearance: Casual  Eye Contact::  Fair  Speech:  Normal Rate  Volume:  Normal  Mood:  Depressed  Affect:  Congruent  Thought Process:  Coherent  Orientation:  Full (Time, Place, and Person)  Thought Content:  WDL  Suicidal Thoughts:  No  Homicidal Thoughts:  No  Memory:  Immediate;   Fair Recent;   Fair Remote;   Fair  Judgement:  Fair  Insight:  Fair  Psychomotor Activity:  Decreased  Concentration:  Fair  Recall:  Fair  Akathisia:  No  Handed:  Right  AIMS (if indicated):     Assets:  Communication Skills Physical Health Resilience  Sleep:  Number of Hours: 6   Current Medications: Current Facility-Administered Medications  Medication Dose Route Frequency Provider Last Rate Last Dose  . acetaminophen (TYLENOL) tablet 650 mg  650 mg Oral Q6H PRN Kerry Hough, PA-C      . alum & mag hydroxide-simeth (MAALOX/MYLANTA) 200-200-20 MG/5ML suspension 30 mL  30 mL Oral Q4H PRN Kerry Hough, PA-C      . buPROPion (WELLBUTRIN XL) 24 hr tablet 150 mg  150 mg Oral Daily Nehemiah Settle, MD   150 mg at 11/20/12 0747  . clonazePAM (KLONOPIN) tablet 1 mg  1 mg Oral QHS Nehemiah Settle,  MD      . dicyclomine (BENTYL) capsule 10 mg  10 mg Oral TID PRN Kerry Hough, PA-C      . hydrOXYzine (ATARAX/VISTARIL) tablet 25 mg  25 mg Oral Q6H PRN Kerry Hough, PA-C   25 mg at 11/19/12 2157  . magnesium hydroxide (MILK OF MAGNESIA) suspension 30 mL  30 mL Oral Daily PRN Kerry Hough, PA-C      . metoprolol succinate (TOPROL-XL) 24 hr tablet 25 mg  25  mg Oral Daily Kerry Hough, PA-C   25 mg at 11/20/12 0748  . pantoprazole (PROTONIX) EC tablet 40 mg  40 mg Oral Daily Kerry Hough, PA-C   40 mg at 11/20/12 0748  . risperiDONE (RISPERDAL) tablet 0.5 mg  0.5 mg Oral BID Nanine Means, NP   0.5 mg at 11/20/12 0749  . topiramate (TOPAMAX) tablet 100 mg  100 mg Oral Daily Nanine Means, NP   100 mg at 11/20/12 0749  . traZODone (DESYREL) tablet 50 mg  50 mg Oral QHS,MR X 1 Kerry Hough, PA-C   50 mg at 11/18/12 2136    Lab Results:  No results found for this or any previous visit (from the past 48 hour(s)).  Physical Findings: AIMS: Facial and Oral Movements Muscles of Facial Expression: None, normal Lips and Perioral Area: None, normal Jaw: None, normal Tongue: None, normal,Extremity Movements Upper (arms, wrists, hands, fingers): None, normal Lower (legs, knees, ankles, toes): None, normal, Trunk Movements Neck, shoulders, hips: None, normal, Overall Severity Severity of abnormal movements (highest score from questions above): None, normal Incapacitation due to abnormal movements: None, normal Patient's awareness of abnormal movements (rate only patient's report): No Awareness, Dental Status Current problems with teeth and/or dentures?: No Does patient usually wear dentures?: No  CIWA:  CIWA-Ar Total: 1 COWS:  COWS Total Score: 2  Treatment Plan Summary: Daily contact with patient to assess and evaluate symptoms and progress in treatment Medication management  Plan:   Continue Klonopin 1 mg PO Qhs Continue Risperidal 0.5 mg BID Review of chart, vital signs, medications, and notes. 1-Individual and group therapy 2-Medication management for depression, mood stability, and anxiety:  Continue  Risperdal-Klonopin-Topamax and wellbutrin 3-Coping skills for depression, anxiety, and mood stability 4-Continue crisis stabilization and management 5-Address health issues--monitoring vital signs, stable 6-Treatment plan in progress  to prevent relapse of depression and anxiety 7. Disposition plans in progress 8. Continue current plan of care. 9. Patient is trying to contact her son regarding her discharge plans. 10. ELOS: 1-2 days.  Medical Decision Making Problem Points:  Established problem, stable/improving (1) and Review of psycho-social stressors (1) Data Points:  Review of new medications or change in dosage (2)  I certify that inpatient services furnished can reasonably be expected to improve the patient's condition.   Rona Ravens. Mashburn RPAC 1:38 PM 11/20/2012  Reviewed the information documented and agree with the treatment plan.  Marchia Diguglielmo,JANARDHAHA R. 11/21/2012 3:06 PM

## 2012-11-21 MED ORDER — RISPERIDONE 0.5 MG PO TABS
0.5000 mg | ORAL_TABLET | Freq: Every day | ORAL | Status: DC
  Administered 2012-11-22: 0.5 mg via ORAL
  Filled 2012-11-21 (×3): qty 1

## 2012-11-21 MED ORDER — RISPERIDONE 1 MG PO TABS
1.0000 mg | ORAL_TABLET | Freq: Every day | ORAL | Status: DC
  Administered 2012-11-21: 1 mg via ORAL
  Filled 2012-11-21: qty 21
  Filled 2012-11-21 (×2): qty 1

## 2012-11-21 MED ORDER — RISPERIDONE 0.5 MG PO TABS: 0.5000 mg | ORAL_TABLET | Freq: Once | ORAL | Status: AC | PRN

## 2012-11-21 NOTE — Progress Notes (Signed)
Pt reports she is feeling better this evening.  She is sitting in the dayroom talking with other patients.  Pt reports she is probably discharging on Thursday.  Her mood seems brighter.  She understands the med changes made today.  Pt denies SI/HI/AV.  She plans to return home.  Pt makes her needs known to staff.  Pt voices no needs or concerns at this time.  Support and encouragement offered.  Safety maintained with q15 minute checks.

## 2012-11-21 NOTE — Progress Notes (Signed)
Patient ID: Karen [name], female   DOB: Mar 08, 1971, 42 y.o.   MRN: [id number] [facility] MD Progress Note  11/21/2012 1:29 PM Karen [name]  MRN:  [id number] Subjective:  Patient seen crying in her room. She is inconsolable but able to respond appropriately when given enough time. There was a misconception from Group and she was extremely upset over this.  Objective: Patient states she is still having racing thoughts and is unable to process information due to being overwhelmed.  Diagnosis:   Axis I: Anxiety Disorder NOS, Bipolar, Depressed and Post Traumatic Stress Disorder Axis II: Deferred Axis III:  Past Medical History  Diagnosis Date  . Hypertension   . Migraine   . IBS (irritable bowel syndrome)   . Bipolar affective disorder   . PTSD (post-traumatic stress disorder)   . Pulmonary embolism   . DVT (deep venous thrombosis)   . Bowel obstruction    Axis IV: other psychosocial or environmental problems, problems related to social environment and problems with primary support group Axis V: 41-50 serious symptoms  ADL's:  Intact  Sleep: Ok  Appetite:  Fair  Suicidal Ideation:  Denies Homicidal Ideation:  Denies  Psychiatric Specialty Exam: Review of Systems  Constitutional: Negative.  Negative for fever, chills, weight loss, malaise/fatigue and diaphoresis.  HENT: Negative.  Negative for congestion and sore throat.   Eyes: Negative.  Negative for blurred vision, double vision and photophobia.  Respiratory: Negative.  Negative for cough, shortness of breath and wheezing.   Cardiovascular: Negative.  Negative for chest pain, palpitations and PND.  Gastrointestinal: Negative.  Negative for heartburn, nausea, vomiting, abdominal pain, diarrhea and constipation.  Genitourinary: Negative.   Musculoskeletal: Negative.  Negative for myalgias, joint pain and falls.  Skin: Negative.   Neurological: Negative.  Negative for dizziness, tingling, tremors, sensory change, speech change, focal  weakness, seizures, loss of consciousness, weakness and headaches.  Endo/Heme/Allergies: Negative.  Negative for polydipsia. Does not bruise/bleed easily.  Psychiatric/Behavioral: Positive for depression. Negative for suicidal ideas, hallucinations, memory loss and substance abuse. The patient is nervous/anxious. The patient does not have insomnia.     Blood pressure 109/79, pulse 101, temperature 98 F (36.7 C), temperature source Oral, resp. rate 16, height 5\' 4"  (1.626 m), weight 80.74 kg (178 lb).Body mass index is 30.54 kg/(m^2).  General Appearance: Casual  Eye Contact::  Fair  Speech:  Normal Rate  Volume:  Normal  Mood:  Depressed  Affect:  Congruent  Thought Process:  Coherent  Orientation:  Full (Time, Place, and Person)  Thought Content:  WDL  Suicidal Thoughts:  No  Homicidal Thoughts:  No  Memory:  Immediate;   Fair Recent;   Fair Remote;   Fair  Judgement:  Fair  Insight:  Fair  Psychomotor Activity:  Decreased  Concentration:  Fair  Recall:  Fair  Akathisia:  No  Handed:  Right  AIMS (if indicated):     Assets:  Communication Skills Physical Health Resilience  Sleep:  Number of Hours: 6   Current Medications: Current Facility-Administered Medications  Medication Dose Route Frequency Provider Last Rate Last Dose  . acetaminophen (TYLENOL) tablet 650 mg  650 mg Oral Q6H PRN Kerry Hough, PA-C      . alum & mag hydroxide-simeth (MAALOX/MYLANTA) 200-200-20 MG/5ML suspension 30 mL  30 mL Oral Q4H PRN Kerry Hough, PA-C      . buPROPion (WELLBUTRIN XL) 24 hr tablet 150 mg  150 mg Oral Daily Nehemiah Settle, MD   150  mg at 11/20/12 0747  . clonazePAM (KLONOPIN) tablet 1 mg  1 mg Oral QHS Nehemiah Settle, MD      . dicyclomine (BENTYL) capsule 10 mg  10 mg Oral TID PRN Kerry Hough, PA-C      . hydrOXYzine (ATARAX/VISTARIL) tablet 25 mg  25 mg Oral Q6H PRN Kerry Hough, PA-C   25 mg at 11/19/12 2157  . magnesium hydroxide (MILK OF  MAGNESIA) suspension 30 mL  30 mL Oral Daily PRN Kerry Hough, PA-C      . metoprolol succinate (TOPROL-XL) 24 hr tablet 25 mg  25 mg Oral Daily Kerry Hough, PA-C   25 mg at 11/20/12 0748  . pantoprazole (PROTONIX) EC tablet 40 mg  40 mg Oral Daily Kerry Hough, PA-C   40 mg at 11/20/12 0748  . risperiDONE (RISPERDAL) tablet 0.5 mg  0.5 mg Oral BID Nanine Means, NP   0.5 mg at 11/20/12 0749  . topiramate (TOPAMAX) tablet 100 mg  100 mg Oral Daily Nanine Means, NP   100 mg at 11/20/12 0749  . traZODone (DESYREL) tablet 50 mg  50 mg Oral QHS,MR X 1 Kerry Hough, PA-C   50 mg at 11/18/12 2136    Lab Results:  No results found for this or any previous visit (from the past 48 hour(s)).  Physical Findings: AIMS: Facial and Oral Movements Muscles of Facial Expression: None, normal Lips and Perioral Area: None, normal Jaw: None, normal Tongue: None, normal,Extremity Movements Upper (arms, wrists, hands, fingers): None, normal Lower (legs, knees, ankles, toes): None, normal, Trunk Movements Neck, shoulders, hips: None, normal, Overall Severity Severity of abnormal movements (highest score from questions above): None, normal Incapacitation due to abnormal movements: None, normal Patient's awareness of abnormal movements (rate only patient's report): No Awareness, Dental Status Current problems with teeth and/or dentures?: No Does patient usually wear dentures?: No  CIWA:  CIWA-Ar Total: 1 COWS:  COWS Total Score: 2  Treatment Plan Summary: Daily contact with patient to assess and evaluate symptoms and progress in treatment Medication management  Plan:   Continue Klonopin 1 mg PO Qhs 1. Increase risperdal to 0.5mg  each morning. 2. Increase risperdal to 1mg  po at hs. 3. Will also write for a 1 prn 0.5 Risperdal per day for agitation. 4. Will continue all other meds as written.   Medical Decision Making Problem Points:  Established problem, stable/improving (1) and Review of  psycho-social stressors (1) Data Points:  Review of new medications or change in dosage (2)  I certify that inpatient services furnished can reasonably be expected to improve the patient's condition.   Rona Ravens. Mashburn RPAC 1:29 PM 11/21/2012  Reviewed the information documented and agree with the treatment plan.  Decklyn Hornik,JANARDHAHA R. 11/21/2012 3:08 PM

## 2012-11-21 NOTE — Progress Notes (Signed)
Pt reports she is doing well.  She still hasn't been able to get in contact with her son so that she can get her belongings, so this is frustrating her.  She says she is working with the CM on her discharge plans.  She denies SI/HI/AV.  She feels her medications are working for her.  She voiced no needs or concerns at this time.  She interacts appropriately with her peers.  Support and encouragement offered.  Safety maintained with q15 minute checks.

## 2012-11-21 NOTE — [facility] Suicide Risk Assessment (Signed)
[facility] INPATIENT:  Family/Significant Other Suicide Prevention Education  Suicide Prevention Education:  Contact Attempts: Berneice Heinrich, son, [phone] has been identified by the patient as the family member/significant other with whom the patient will be residing, and identified as the person(s) who will aid the patient in the event of a mental health crisis.  With written consent from the patient, two attempts were made to provide suicide prevention education, prior to and/or following the patient's discharge.  We were unsuccessful in providing suicide prevention education.  A suicide education pamphlet was given to the patient to share with family/significant other.  Date and time of first attempt: 11/16/12  10:30AM Date and time of second attempt: 11/19/12  5:45PM  Ida Rogue 11/21/2012, 8:34 AM

## 2012-11-21 NOTE — Progress Notes (Signed)
Adult Psychoeducational Group Note  Date:  11/21/2012 Time:  10:00 PM  Group Topic/Focus:  Wrap-Up Group:   The focus of this group is to help patients review their daily goal of treatment and discuss progress on daily workbooks.  Participation Level:  Active  Participation Quality:  Appropriate  Affect:  Appropriate  Cognitive:  Alert  Insight: Appropriate  Engagement in Group:  Engaged  Modes of Intervention:  Discussion  Additional Comments:  Pt stated that her day started off awful. What she plans to take away from this experience is that she has to take her of herself first.  Kaleen Odea R 11/21/2012, 10:00 PM

## 2012-11-21 NOTE — Progress Notes (Signed)
D: Patient pleasant and cooperative with staff and peers. Patient's affect is appropriate to circumstance and mood is slightly anxious. She reported on the self inventory sheet that she slept well, appetite is good, energy level is normal and ability to pay attention is improving. Patient rated depression and feelings of hopelessness "1". Patient is attending and participating in groups and compliant with medication regimen.  A: Support and encouragement provided to patient. Administered scheduled medications per ordering MD. Monitor Q15 minute checks for safety.  R: Patient receptive. Denies SI/HI/AVH. Patient remains safe.

## 2012-11-21 NOTE — [facility] Group Notes (Signed)
[facility] LCSW Aftercare Discharge Planning Group Note   11/21/2012 10:22 AM  Participation Quality:  Appropraite  Mood/Affect:  Appropriate  Depression Rating:  1  Anxiety Rating:  1  Thoughts of Suicide:  No  Will you contract for safety?   NA  Current AVH:  No  Plan for Discharge/Comments:  Patient attending discharge planning group and actively participated in group. Patient will return to her home and follow up with [facility] on discharge.  CSW provided all participants with daily workbook and information on services offered by [facility].   Transportation Means: Patient has transportation.   Supports:  Patient has a support system.   Kirstina Leinweber, Joesph July

## 2012-11-21 NOTE — Progress Notes (Signed)
Psychoeducational Group Note  Date:  11/21/2012 Time:  1100  Group Topic/Focus:  Personal Choices and Values:   The focus of this group is to help patients assess and explore the importance of values in their lives, how their values affect their decisions, how they express their values and what opposes their expression.  Participation Level: Did Not Attend  Participation Quality:  Not Applicable  Affect:  Not Applicable  Cognitive:  Not Applicable  Insight:  Not Applicable  Engagement in Group: Not Applicable  Additional Comments:  Patient did not attend group, patient remained in bed.  Karleen Hampshire Brittini 11/21/2012, 6:44 PM

## 2012-11-21 NOTE — Progress Notes (Signed)
Recreation Therapy Notes  Date: 08.06.2014 Time: 3:00pm Location: [address] Dayroom  Group Topic: Communication, Team Building, Problem Solving  Goal Area(s) Addresses:  Patient will effectively work in groups to accomplish shared goal. Patient will verbalize skills used in to make activity successful. Patient will verbalize benefit of using skills in a healthy way. Patient will relate skills used during group session to personal development.   Behavioral Response: Observation, Active Listening  Intervention: Team Activity  Activity: Landing Pad. Patients were divided into two groups. Patients were given 12 drinking straws and a length of masking tape. Using supplies provided patients were asked to build a freestanding structure to catch a plastic golf ball dropped from approximately 4 feet.   Education: Customer service manager, Discharge Planning, Relapse Prevention  Education Outcome: Acknowledges Understanding.   Clinical Observations/Feedback: Patient attended group session, but made no contributions. Patient took a observers role in group. Patient offered no suggestions for building landing pad and had no part in building structure. Patient group unsuccessful at building a landing pad. Patient appeared to actively listen to opening and wrap up discussion, as she maintained appropriate eye contact with person speaking and nodded in agreement with points of interest raised by peers.   Marykay Lex Muskaan Smet, LRT/CTRS  Jearl Klinefelter 11/21/2012 4:26 PM

## 2012-11-21 NOTE — [facility] Group Notes (Signed)
[facility] LCSW Group Therapy  Emotional Regulation 1:15 - 2: 30 PM        11/21/2012  1:14 PM   Type of Therapy:  Group Therapy  Participation Level: Did not attend group.  Wynn Banker 11/21/2012 1:14 PM

## 2012-11-21 NOTE — [facility] Group Notes (Signed)
[facility] Group Notes:  (Nursing/MHT/Case Management/Adjunct)  Date:  11/21/2012  Time:  11:25 AM  Type of Therapy:  Nurse Education  Participation Level:  Active  Participation Quality:  Appropriate and Attentive  Affect:  Appropriate  Cognitive:  Alert and Appropriate  Insight:  Good  Engagement in Group:  Engaged  Modes of Intervention:  Education  Summary of Progress/Problems:  Karen [name] 11/21/2012, 11:25 AM

## 2012-11-22 MED ORDER — CLONAZEPAM 1 MG PO TABS: 1.0000 mg | ORAL_TABLET | Freq: Every day | ORAL | Status: DC

## 2012-11-22 MED ORDER — BUPROPION HCL ER (XL) 150 MG PO TB24: 150.0000 mg | ORAL_TABLET | Freq: Every day | ORAL | Status: DC

## 2012-11-22 MED ORDER — METOPROLOL SUCCINATE ER 25 MG PO TB24: 25.0000 mg | ORAL_TABLET | Freq: Every day | ORAL | Status: DC

## 2012-11-22 MED ORDER — RISPERIDONE 1 MG PO TABS: ORAL_TABLET | ORAL | Status: DC

## 2012-11-22 MED ORDER — TOPIRAMATE 100 MG PO TABS: 100.0000 mg | ORAL_TABLET | Freq: Every day | ORAL | Status: DC

## 2012-11-22 NOTE — Progress Notes (Signed)
Adult Psychoeducational Group Note  Date:  11/22/2012 Time:  11:00AM Group Topic/Focus:  Self Esteem Action Plan:   The focus of this group is to help patients create a plan to continue to build self-esteem after discharge.  Participation Level:  Minimal  Participation Quality:  Appropriate  Affect:  Appropriate  Cognitive:  Alert and Appropriate  Insight: Appropriate  Engagement in Group:  Lacking  Modes of Intervention:  Discussion  Additional Comments:  Pt. Was appropriate and attentive during today's group discussion. Pt. Was able to complete worksheet on ABC's of positive self esteem. Pt. Didn't share any words to described positive things. Pt. However was able to listen to other peers and listen to group discussion.   Karen [name] D 11/22/2012, 11:56 AM

## 2012-11-22 NOTE — Progress Notes (Signed)
[facility] Adult Case Management Discharge Plan :  Will you be returning to the same living situation after discharge: Yes,  home At discharge, do you have transportation home?:Yes,  bus pass Do you have the ability to pay for your medications:Yes,  mental health  Release of information consent forms completed and in the chart;  Patient's signature needed at discharge.  Patient to Follow up at: Follow-up Information   Follow up with [facility] On 11/23/2012. (Please go to [facility]'s walk in clinic Monday-Friday, between 8 and 9AM for your hospital follow up appointment)    Contact information:   [address] [place], [place]   [address]  [phone]      Patient denies SI/HI:   Yes,  yes    Safety Planning and Suicide Prevention discussed:  Yes,  yes  Ida Rogue 11/22/2012, 3:02 PM

## 2012-11-22 NOTE — Discharge Summary (Signed)
Physician Discharge Summary Note  Patient:  Karen [name] is an 42 y.o., female MRN:  [id number] DOB:  01-Feb-1971 Patient phone:  [phone] (home)  Patient address:   [address] [name] [address],   Date of Admission:  11/15/2012 Date of Discharge: 11/22/2012   Reason for Admission:  Depression with suicidal ideation  Discharge Diagnoses: Principal Problem:   Bipolar I disorder, most recent episode depressed Active Problems:   PTSD (post-traumatic stress disorder)  Review of Systems  Constitutional: Negative.  Negative for fever, chills, weight loss, malaise/fatigue and diaphoresis.  HENT: Negative for congestion and sore throat.   Eyes: Negative for blurred vision, double vision and photophobia.  Respiratory: Negative for cough, shortness of breath and wheezing.   Cardiovascular: Negative for chest pain, palpitations and PND.  Gastrointestinal: Negative for heartburn, nausea, vomiting, abdominal pain, diarrhea and constipation.  Musculoskeletal: Negative for myalgias, joint pain and falls.  Neurological: Negative for dizziness, tingling, tremors, sensory change, speech change, focal weakness, seizures, loss of consciousness, weakness and headaches.  Endo/Heme/Allergies: Negative for polydipsia. Does not bruise/bleed easily.  Psychiatric/Behavioral: Negative for depression, suicidal ideas, hallucinations, memory loss and substance abuse. The patient is not nervous/anxious and does not have insomnia.   Discharge Diagnoses:  AXIS I: Bipolar, mixed and Post Traumatic Stress Disorder  AXIS II: Deferred  AXIS III:  Past Medical History   Diagnosis  Date   .  Hypertension    .  Migraine    .  IBS (irritable bowel syndrome)    .  Bipolar affective disorder    .  PTSD (post-traumatic stress disorder)    .  Pulmonary embolism    .  DVT (deep venous thrombosis)    .  Bowel obstruction     AXIS IV: economic problems, occupational problems, other psychosocial or  environmental problems, problems related to social environment and problems with primary support group  AXIS V: 51-60 moderate symptoms   Level of Care:  OP  Hospital Course:         Shanette was admitted after she was dropped off at the ED by her son when she reported increasing symptoms of depression and anxiety and being off of her psychiatric medication for over 6 months. She was hysterical and crying loudly, paranoid with racing thoughts. She noted suicidal ideation but could not detail a plan.  She was evaluated and felt to be in need of acute psychiatric admission for stabilization and crisis management.        Upon admission to the unit the patient was evaluated and her symptoms were identified.  The symptoms were noted as medical non-compliance, anhedonia, worsening depression, pressured speech, racing thoughts, distractibility, tangentiality, poor concentration, impaired memory, increased anxiety, panic attacks, paranoia and rumination.      The patient was oriented to the unit and encouraged to participate in unit programming. Medical problems were identified and treated appropriately. Home medication was restarted as needed. Psychiatric medication management was initiated.        Kristilyn was evaluated each day by a clinical provider to ascertain the patient's response to treatment.  Improvement was noted by the patient's report of decreasing symptoms, improved sleep and appetite, affect, medication tolerance, behavior, and participation in unit programming.  She was asked each day to complete a self inventory noting mood, mental status, pain, new symptoms, anxiety and concerns.        The patient responded well to medication and being in a therapeutic and supportive environment. Positive  and appropriate behavior was noted and the patient was motivated for recovery. She worked closely with the treatment team and case manager to develop a discharge plan with appropriate goals. Coping skills, problem  solving as well as relaxation therapies were also part of the unit programming.         By the day of discharge the patient was in much improved condition than upon admission.  Symptoms were reported as significantly decreased or resolved completely.  The patient denied SI/HI and voiced no AVH. She was motivated to continue taking medication with a goal of continued improvement in mental health.  She was discharged home with a plan to follow up as noted below.  Consults:  None  Significant Diagnostic Studies:  None  Discharge Vitals:   Blood pressure 107/77, pulse 84, temperature 97.8 F (36.6 C), temperature source Oral, resp. rate 16, height 5\' 4"  (1.626 m), weight 80.74 kg (178 lb). Body mass index is 30.54 kg/(m^2). Lab Results:   No results found for this or any previous visit (from the past 72 hour(s)).  Physical Findings: AIMS: Facial and Oral Movements Muscles of Facial Expression: None, normal Lips and Perioral Area: None, normal Jaw: None, normal Tongue: None, normal,Extremity Movements Upper (arms, wrists, hands, fingers): None, normal Lower (legs, knees, ankles, toes): None, normal, Trunk Movements Neck, shoulders, hips: None, normal, Overall Severity Severity of abnormal movements (highest score from questions above): None, normal Incapacitation due to abnormal movements: None, normal Patient's awareness of abnormal movements (rate only patient's report): No Awareness, Dental Status Current problems with teeth and/or dentures?: No Does patient usually wear dentures?: No  CIWA:  CIWA-Ar Total: 1 COWS:  COWS Total Score: 2  Psychiatric Specialty Exam: See Psychiatric Specialty Exam and Suicide Risk Assessment completed by Attending Physician prior to discharge.  Discharge destination:  Home  Is patient on multiple antipsychotic therapies at discharge:  No   Has Patient had three or more failed trials of antipsychotic monotherapy by history:  No  Recommended Plan for  Multiple Antipsychotic Therapies: na  Discharge Orders   Future Orders Complete By Expires     Diet - low sodium heart healthy  As directed     Discharge instructions  As directed     Comments:      Take all of your medications as directed. Be sure to keep all of your follow up appointments.  If you are unable to keep your follow up appointment, call your Doctor's office to let them know, and reschedule.  Make sure that you have enough medication to last until your appointment. Be sure to get plenty of rest. Going to bed at the same time each night will help. Try to avoid sleeping during the day.  Increase your activity as tolerated. Regular exercise will help you to sleep better and improve your mental health. Eating a heart healthy diet is recommended. Try to avoid salty or fried foods. Be sure to avoid all alcohol and illegal drugs.    Increase activity slowly  As directed         Medication List    STOP taking these medications       acetaminophen 500 MG tablet  Commonly known as:  TYLENOL     HYDROcodone-acetaminophen 5-325 MG per tablet  Commonly known as:  NORCO/VICODIN     metroNIDAZOLE 500 MG tablet  Commonly known as:  FLAGYL      TAKE these medications     Indication   buPROPion 150 MG  24 hr tablet  Commonly known as:  WELLBUTRIN XL  Take 1 tablet (150 mg total) by mouth daily.   Indication:  Major Depressive Disorder     clonazePAM 1 MG tablet  Commonly known as:  KLONOPIN  Take 1 tablet (1 mg total) by mouth at bedtime.   Indication:  Panic Disorder     dicyclomine 10 MG capsule  Commonly known as:  BENTYL  Take 10 mg by mouth 3 (three) times daily as needed (for IBS symptoms, usually takes at betime).    for IBS   metoprolol succinate 25 MG 24 hr tablet  Commonly known as:  TOPROL-XL  Take 1 tablet (25 mg total) by mouth daily.   Indication:  Feeling Anxious, High Blood Pressure     omeprazole 40 MG capsule  Commonly known as:  PRILOSEC  Take 40 mg by  mouth 2 (two) times daily as needed (Upset stomach).    GERD   risperiDONE 1 MG tablet  Commonly known as:  RISPERDAL  Take one half of a tablet each morning (0.5mg ) and 1 whole tablet at bedtime (1mg ) for racing thoughts and mental clarity.   Indication:  Manic-Depression     topiramate 100 MG tablet  Commonly known as:  TOPAMAX  Take 1 tablet (100 mg total) by mouth daily. For mood stabilization.   Indication:  Manic-Depression that is Resistant to Treatment           Follow-up Information   Follow up with [facility]. (Please go to [facility]'s walk in clinic Monday-Friday, between 8 and 9AM for your hospital follow up appointment)    Contact information:   [address] [place], [place]   [address]  [phone]      Follow-up recommendations:   Activities: Resume activity as tolerated. Diet: Heart healthy low sodium diet Tests: Follow up testing will be determined by your out patient provider.  Comments:    Total Discharge Time:  Greater than 30 minutes.  Signed: Rona Ravens. Mashburn RPAC 11:35 AM 11/22/2012  Patient is seen and evaluated and developed treatment plan and case discussed with physician extender. Reviewed the information documented and agree with the treatment plan.  Shaila Gilchrest,JANARDHAHA R. 11/22/2012 12:50 PM
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
## 2012-11-22 NOTE — Progress Notes (Signed)
Adult Psychoeducational Group Note  Date:  11/22/2012 Time:  09:00 AM  Group Topic/Focus:  Rediscovering Joy:   The focus of this group is to explore various ways to relieve stress in a positive manner.  Participation Level:  Active  Participation Quality:  Appropriate and Attentive  Affect:  Appropriate  Cognitive:  Alert and Oriented  Insight: Appropriate  Engagement in Group:  Engaged  Modes of Intervention:  Discussion  Additional Comments:  Patient verbalized that she finds joy in laughing at herself about something funny that she may do or say.  Harold Barban E 11/22/2012, 9:36 AM

## 2012-11-22 NOTE — Progress Notes (Signed)
Discharge Note: Discharge instructions/prescriptions/medication samples and bus pass given to patient. Patient verbalized understanding of discharge instructions and prescriptions. Returned belongings to patient. Denies SI/HI/AVH. Patient d/c without incident to the lobby.

## 2012-11-27 NOTE — Progress Notes (Signed)
Patient Discharge Instructions:  After Visit Summary (AVS):   Faxed to:  11/27/12 Discharge Summary Note:   Faxed to:  11/27/12 Psychiatric Admission Assessment Note:   Faxed to:  11/27/12 Suicide Risk Assessment - Discharge Assessment:   Faxed to:  11/27/12 Faxed/Sent to the Next Level Care provider:  11/27/12 Faxed to [facility] @ [phone]  Jerelene Redden, 11/27/2012, 4:02 PM

## 2012-12-03 ENCOUNTER — Emergency Department (HOSPITAL_COMMUNITY)
Admission: EM | Admit: 2012-12-03 | Discharge: 2012-12-03 | Disposition: A | Discharge status: Other Acute Inpatient Hospital | Payer: Medicaid Other | Attending: Emergency Medicine | Admitting: Emergency Medicine

## 2012-12-03 ENCOUNTER — Emergency Department (HOSPITAL_COMMUNITY): Payer: Medicaid Other

## 2012-12-03 ENCOUNTER — Inpatient Hospital Stay (HOSPITAL_COMMUNITY): Admission: AD | Admit: 2012-12-03 | Payer: Self-pay | Source: Intra-hospital | Admitting: Psychiatry

## 2012-12-03 ENCOUNTER — Encounter (HOSPITAL_COMMUNITY): Payer: Self-pay | Admitting: Emergency Medicine

## 2012-12-03 ENCOUNTER — Ambulatory Visit (HOSPITAL_COMMUNITY)
Admission: RE | Admit: 2012-12-03 | Discharge: 2012-12-03 | Disposition: A | Discharge status: Home / Self Care | Payer: Medicaid Other | Attending: Psychiatry | Admitting: Psychiatry

## 2012-12-03 ENCOUNTER — Inpatient Hospital Stay (HOSPITAL_COMMUNITY)
Admission: AD | Admit: 2012-12-03 | Discharge: 2012-12-10 | DRG: 885 | Disposition: A | Discharge status: Home / Self Care | Payer: Medicaid Other | Source: Intra-hospital | Attending: Psychiatry | Admitting: Psychiatry

## 2012-12-03 DIAGNOSIS — F4310 Post-traumatic stress disorder, unspecified: Secondary | ICD-10-CM | POA: Insufficient documentation

## 2012-12-03 DIAGNOSIS — R45851 Suicidal ideations: Secondary | ICD-10-CM

## 2012-12-03 DIAGNOSIS — R197 Diarrhea, unspecified: Secondary | ICD-10-CM | POA: Insufficient documentation

## 2012-12-03 DIAGNOSIS — Z9889 Other specified postprocedural states: Secondary | ICD-10-CM | POA: Insufficient documentation

## 2012-12-03 DIAGNOSIS — Z79899 Other long term (current) drug therapy: Secondary | ICD-10-CM | POA: Insufficient documentation

## 2012-12-03 DIAGNOSIS — K589 Irritable bowel syndrome without diarrhea: Secondary | ICD-10-CM | POA: Diagnosis present

## 2012-12-03 DIAGNOSIS — I10 Essential (primary) hypertension: Secondary | ICD-10-CM | POA: Insufficient documentation

## 2012-12-03 DIAGNOSIS — G43909 Migraine, unspecified, not intractable, without status migrainosus: Secondary | ICD-10-CM | POA: Diagnosis present

## 2012-12-03 DIAGNOSIS — Z5987 Material hardship due to limited financial resources, not elsewhere classified: Secondary | ICD-10-CM

## 2012-12-03 DIAGNOSIS — Z86711 Personal history of pulmonary embolism: Secondary | ICD-10-CM | POA: Insufficient documentation

## 2012-12-03 DIAGNOSIS — Z86718 Personal history of other venous thrombosis and embolism: Secondary | ICD-10-CM

## 2012-12-03 DIAGNOSIS — F3130 Bipolar disorder, current episode depressed, mild or moderate severity, unspecified: Principal | ICD-10-CM | POA: Diagnosis present

## 2012-12-03 DIAGNOSIS — R109 Unspecified abdominal pain: Secondary | ICD-10-CM | POA: Insufficient documentation

## 2012-12-03 DIAGNOSIS — Z8719 Personal history of other diseases of the digestive system: Secondary | ICD-10-CM | POA: Insufficient documentation

## 2012-12-03 DIAGNOSIS — Z598 Other problems related to housing and economic circumstances: Secondary | ICD-10-CM

## 2012-12-03 DIAGNOSIS — G4700 Insomnia, unspecified: Secondary | ICD-10-CM | POA: Diagnosis present

## 2012-12-03 DIAGNOSIS — F319 Bipolar disorder, unspecified: Secondary | ICD-10-CM | POA: Insufficient documentation

## 2012-12-03 DIAGNOSIS — Z5989 Other problems related to housing and economic circumstances: Secondary | ICD-10-CM

## 2012-12-03 DIAGNOSIS — Z5900 Homelessness unspecified: Secondary | ICD-10-CM

## 2012-12-03 DIAGNOSIS — F411 Generalized anxiety disorder: Secondary | ICD-10-CM | POA: Diagnosis present

## 2012-12-03 DIAGNOSIS — F609 Personality disorder, unspecified: Secondary | ICD-10-CM | POA: Diagnosis present

## 2012-12-03 DIAGNOSIS — Z90710 Acquired absence of both cervix and uterus: Secondary | ICD-10-CM | POA: Insufficient documentation

## 2012-12-03 DIAGNOSIS — Z9089 Acquired absence of other organs: Secondary | ICD-10-CM | POA: Insufficient documentation

## 2012-12-03 DIAGNOSIS — F39 Unspecified mood [affective] disorder: Secondary | ICD-10-CM | POA: Insufficient documentation

## 2012-12-03 LAB — RAPID URINE DRUG SCREEN, HOSP PERFORMED
Barbiturates: NOT DETECTED
Cocaine: NOT DETECTED
Opiates: NOT DETECTED
Tetrahydrocannabinol: NOT DETECTED

## 2012-12-03 LAB — CBC
MCH: 32.5 pg (ref 26.0–34.0)
MCHC: 35.3 g/dL (ref 30.0–36.0)
Platelets: 269 10*3/uL (ref 150–400)
RDW: 12.5 % (ref 11.5–15.5)

## 2012-12-03 LAB — COMPREHENSIVE METABOLIC PANEL
ALT: 14 U/L (ref 0–35)
AST: 20 U/L (ref 0–37)
Calcium: 9.7 mg/dL (ref 8.4–10.5)
GFR calc Af Amer: >90 mL/min (ref >90)
Glucose, Bld: 96 mg/dL (ref 70–99)
Sodium: 135 mEq/L (ref 135–145)
Total Protein: 7.7 g/dL (ref 6.0–8.3)

## 2012-12-03 MED ORDER — NICOTINE 21 MG/24HR TD PT24: 21.0000 mg | MEDICATED_PATCH | Freq: Every day | TRANSDERMAL | Status: DC

## 2012-12-03 MED ORDER — ZOLPIDEM TARTRATE 5 MG PO TABS: 5.0000 mg | ORAL_TABLET | Freq: Every evening | ORAL | Status: DC | PRN

## 2012-12-03 MED ORDER — MAGNESIUM HYDROXIDE 400 MG/5ML PO SUSP: 30.0000 mL | Freq: Every day | ORAL | Status: DC | PRN

## 2012-12-03 MED ORDER — ACETAMINOPHEN 325 MG PO TABS
650.0000 mg | ORAL_TABLET | Freq: Four times a day (QID) | ORAL | Status: DC | PRN
  Administered 2012-12-04 – 2012-12-05 (×2): 650 mg via ORAL

## 2012-12-03 MED ORDER — ACETAMINOPHEN 325 MG PO TABS: 650.0000 mg | ORAL_TABLET | ORAL | Status: DC | PRN

## 2012-12-03 MED ORDER — HYDROXYZINE HCL 50 MG PO TABS
50.0000 mg | ORAL_TABLET | Freq: Every evening | ORAL | Status: DC | PRN
  Administered 2012-12-06 – 2012-12-09 (×4): 50 mg via ORAL
  Filled 2012-12-03: qty 3
  Filled 2012-12-03: qty 1

## 2012-12-03 MED ORDER — LORAZEPAM 1 MG PO TABS
1.0000 mg | ORAL_TABLET | Freq: Three times a day (TID) | ORAL | Status: DC | PRN
  Administered 2012-12-03: 1 mg via ORAL
  Filled 2012-12-03: qty 1

## 2012-12-03 MED ORDER — ALUM & MAG HYDROXIDE-SIMETH 200-200-20 MG/5ML PO SUSP: 30.0000 mL | ORAL | Status: DC | PRN

## 2012-12-03 MED ORDER — IBUPROFEN 200 MG PO TABS: 600.0000 mg | ORAL_TABLET | Freq: Three times a day (TID) | ORAL | Status: DC | PRN

## 2012-12-03 MED ORDER — ONDANSETRON HCL 4 MG PO TABS
4.0000 mg | ORAL_TABLET | Freq: Three times a day (TID) | ORAL | Status: DC | PRN
Start: 2012-12-03 — End: 2012-12-03

## 2012-12-03 NOTE — ED Notes (Signed)
Patient in blue scrubs and red socks.  

## 2012-12-03 NOTE — ED Notes (Signed)
Patient transport with no acute distress noted.

## 2012-12-03 NOTE — ED Notes (Signed)
Patient has 5 bag of belongings -- Patient and belongings both wanded by security.

## 2012-12-03 NOTE — ED Notes (Signed)
Per Ladona Horns., RN and Sherian Maroon, RN report has been called to to Sheldon, RN at [facility] and patient can be transported after the change of shift.

## 2012-12-03 NOTE — BH Assessment (Signed)
Charge nurse-Diane notified that patient was assessed here at [facility] and sent to [facility] for medical clearance. Pt accepted by Verne Spurr, PA pending bed availability. No beds are available at this time.   Paige notified of the above information.

## 2012-12-03 NOTE — ED Notes (Addendum)
Pt states she is having suicidal thoughts along with a plan, has not attempted anything at the moment. Pt states she is very depressed. Was not an all of a sudden reaction, events have been leading up to this. Per pt.
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
## 2012-12-03 NOTE — ED Provider Notes (Signed)
CSN: [id number]     Arrival date & time 12/03/12  1403 History  This chart was scribed for non-physician practitioner, Junious Silk, PA-C working with Nelia Shi, MD by Greggory Stallion, ED scribe. This patient was seen in room WTR1/WLPT1 and the patient's care was started at 3:02 PM.   Chief Complaint  Patient presents with  . Suicidal   The history is provided by the patient. No language interpreter was used.    HPI Comments: Karen [name] is a 42 y.o. female who presents to the Emergency Department complaining of gradual onset, constant suicidal ideations. Pt states several events have lead up to her having these thoughts. She states she is very depressed and has a plan. She states she has tried to commit suicide before, the last time being a few years ago. Pt has been taking all of her antidepressant medications. Pt has had abdominal pain that started 3 days ago with associated diarrhea. She denies hematochezia, trouble breathing, SOB and leg swelling as associated symptoms. Pt denies drug or alcohol use.   Past Medical History  Diagnosis Date  . Hypertension   . Migraine   . IBS (irritable bowel syndrome)   . Bipolar affective disorder   . PTSD (post-traumatic stress disorder)   . Pulmonary embolism   . DVT (deep venous thrombosis)   . Bowel obstruction    Past Surgical History  Procedure Laterality Date  . Abdominal surgery    . Abdominal hysterectomy    . Knee arthroscopy    . Appendectomy    . Cholecystectomy     No family history on file. History  Substance Use Topics  . Smoking status: Never Smoker   . Smokeless tobacco: Not on file  . Alcohol Use: Yes     Comment: drinks 1-2 times monthly   OB History   Grav Para Term Preterm Abortions TAB SAB Ect Mult Living                 Review of Systems  Respiratory: Negative for shortness of breath.   Cardiovascular: Negative for leg swelling.  Gastrointestinal: Positive for abdominal pain and diarrhea. Negative  for blood in stool.  Psychiatric/Behavioral: Positive for suicidal ideas and dysphoric mood.  All other systems reviewed and are negative.    Allergies  Aspirin; Benadryl; Ciprofloxacin hcl; Morphine and related; and Sulfa antibiotics  Home Medications   Current Outpatient Rx  Name  Route  Sig  Dispense  Refill  . buPROPion (WELLBUTRIN XL) 150 MG 24 hr tablet   Oral   Take 1 tablet (150 mg total) by mouth daily.   30 tablet   0   . clonazePAM (KLONOPIN) 1 MG tablet   Oral   Take 1 tablet (1 mg total) by mouth at bedtime.   30 tablet   0   . dicyclomine (BENTYL) 10 MG capsule   Oral   Take 10 mg by mouth 3 (three) times daily as needed (for IBS symptoms, usually takes at betime).         Marland Kitchen ibuprofen (ADVIL,MOTRIN) 200 MG tablet   Oral   Take 200 mg by mouth every 6 (six) hours as needed for pain.         . metoprolol succinate (TOPROL-XL) 25 MG 24 hr tablet   Oral   Take 1 tablet (25 mg total) by mouth daily.   30 tablet   0   . omeprazole (PRILOSEC) 40 MG capsule   Oral   Take  40 mg by mouth 2 (two) times daily as needed (Upset stomach).         . risperiDONE (RISPERDAL) 1 MG tablet      Take one half of a tablet each morning (0.5mg ) and 1 whole tablet at bedtime (1mg ) for racing thoughts and mental clarity.   45 tablet   0   . topiramate (TOPAMAX) 100 MG tablet   Oral   Take 1 tablet (100 mg total) by mouth daily. For mood stabilization.   30 tablet   0    BP 120/83  Pulse 91  Temp(Src) 98.6 F (37 C) (Oral)  Resp 20  SpO2 100%  Physical Exam  Nursing note and vitals reviewed. Constitutional: She is oriented to person, place, and time. She appears well-developed and well-nourished. No distress.  HENT:  Head: Normocephalic and atraumatic.  Right Ear: External ear normal.  Left Ear: External ear normal.  Nose: Nose normal.  Mouth/Throat: Oropharynx is clear and moist.  Eyes: Conjunctivae are normal.  Neck: Normal range of motion.   Cardiovascular: Normal rate, regular rhythm and normal heart sounds.   Pulmonary/Chest: Effort normal and breath sounds normal. No stridor. No respiratory distress. She has no wheezes. She has no rales.  Abdominal: Soft. Bowel sounds are normal. She exhibits no distension. There is tenderness. There is guarding. There is no rebound.  Diffuse tenderness to palpation on abdomen. Some guarding.   Musculoskeletal: Normal range of motion.  Neurological: She is alert and oriented to person, place, and time. She has normal strength.  Skin: Skin is warm and dry. She is not diaphoretic. No erythema.  Psychiatric: She has a normal mood and affect. Her behavior is normal.  Depressed. Suicidal ideations.     ED Course   Procedures (including critical care time)  DIAGNOSTIC STUDIES: Oxygen Saturation is 100% on RA, normal by my interpretation.    COORDINATION OF CARE: 3:25 PM-Discussed treatment plan which includes labs and abdominal CT with pt at bedside and pt agreed to plan.   Labs Reviewed  COMPREHENSIVE METABOLIC PANEL - Abnormal; Notable for the following:    CO2 17 (*)    All other components within normal limits  SALICYLATE LEVEL - Abnormal; Notable for the following:    Salicylate Lvl <2.0 (*)    All other components within normal limits  ACETAMINOPHEN LEVEL  CBC  ETHANOL  URINE RAPID DRUG SCREEN (HOSP PERFORMED)   Dg Abd Acute W/chest  12/03/2012   *RADIOLOGY REPORT*  Clinical Data: Abdominal pain with nausea vomiting.  ACUTE ABDOMEN SERIES (ABDOMEN 2 VIEW & CHEST 1 VIEW)  Comparison: Chest x-ray from 06/27/2012  Findings: The lungs are clear without focal consolidation, edema, effusion or pneumothorax.  Cardiopericardial silhouette is within normal limits for size.  Imaged bony structures of the thorax are intact.  Upright film shows no evidence for intraperitoneal free air. There is no evidence for gaseous bowel dilation to suggest obstruction. Multiple surgical clips are seen over  the midline abdomen and right lower pelvis.  Visualized bony structures are unremarkable. Numerous phleboliths are seen in the anatomic pelvis bilaterally.  IMPRESSION: No acute cardiopulmonary findings.  No evidence for bowel obstruction or perforation.   Original Report Authenticated By: Kennith Center, M.D.   1. Suicidal ideation     MDM  Patient with suicidal ideation and abdominal pain with hx of IBS. Labs are generally WNL. Acute abdominal series shows no obstruction. I believe this is the patient's IBS. She has been accepted to [facility],  but they have no beds. Medically cleared, will move to psych ED until a bed becomes available at [facility].    I personally performed the services described in this documentation, which was scribed in my presence. The recorded information has been reviewed and is accurate.    Mora Bellman, PA-C 12/03/12 2208
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
## 2012-12-03 NOTE — Progress Notes (Signed)
Patient ID: Karen [name], female   DOB: 09-03-1970, 42 y.o.   MRN: [id number] Pt admitted voluntarily to [facility].  Fifteen minute checks initiated.  Search completed.  Pt oriented to unit.  Reported arrival to RN.

## 2012-12-03 NOTE — ED Notes (Signed)
Per Victorino December patient can be transported at this time.

## 2012-12-03 NOTE — [facility] Counselor (Signed)
Pt accepted attending Dr. Elsie Saas, bed 507-1. All documents have been signed and faxed to Triage Office. RN provided extension for report. Denice Bors, AADC 12/03/2012 5:00 PM

## 2012-12-04 ENCOUNTER — Encounter (HOSPITAL_COMMUNITY): Payer: Self-pay | Admitting: Behavioral Health

## 2012-12-04 DIAGNOSIS — F4310 Post-traumatic stress disorder, unspecified: Secondary | ICD-10-CM

## 2012-12-04 DIAGNOSIS — F3130 Bipolar disorder, current episode depressed, mild or moderate severity, unspecified: Principal | ICD-10-CM

## 2012-12-04 MED ORDER — BUPROPION HCL ER (XL) 150 MG PO TB24
150.0000 mg | ORAL_TABLET | Freq: Every day | ORAL | Status: DC
  Administered 2012-12-04 – 2012-12-10 (×7): 150 mg via ORAL
  Filled 2012-12-04 (×11): qty 1

## 2012-12-04 MED ORDER — METOPROLOL SUCCINATE ER 25 MG PO TB24
25.0000 mg | ORAL_TABLET | Freq: Every day | ORAL | Status: DC
  Administered 2012-12-04 – 2012-12-10 (×7): 25 mg via ORAL
  Filled 2012-12-04 (×9): qty 1

## 2012-12-04 MED ORDER — RISPERIDONE 1 MG PO TABS
1.0000 mg | ORAL_TABLET | Freq: Every day | ORAL | Status: DC
  Administered 2012-12-04: 1 mg via ORAL
  Filled 2012-12-04 (×4): qty 1

## 2012-12-04 MED ORDER — PANTOPRAZOLE SODIUM 40 MG PO TBEC
40.0000 mg | DELAYED_RELEASE_TABLET | Freq: Every day | ORAL | Status: DC
  Administered 2012-12-04 – 2012-12-10 (×7): 40 mg via ORAL
  Filled 2012-12-04 (×10): qty 1

## 2012-12-04 MED ORDER — DICYCLOMINE HCL 10 MG PO CAPS
10.0000 mg | ORAL_CAPSULE | Freq: Three times a day (TID) | ORAL | Status: DC | PRN
  Administered 2012-12-04 – 2012-12-08 (×2): 10 mg via ORAL
  Filled 2012-12-04 (×4): qty 1
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
## 2012-12-04 NOTE — Progress Notes (Signed)
Adult Psychoeducational Group Note  Date:  12/04/2012 Time:  2:58 PM  Group Topic/Focus:  Recovery Goals:   The focus of this group is to identify appropriate goals for recovery and establish a plan to achieve them.  Participation Level:  Did Not Attend   Cathlean Cower 12/04/2012, 2:58 PM

## 2012-12-04 NOTE — Progress Notes (Signed)
Recreation Therapy Notes  Date: 08.19.2014 Time: 2:45pm Location: [address] Dayroom  Group Topic: Animal Assisted Activities (AAA)  Goal Area(s) Addresses:  Patient will interact appropriately with dog team.    Behavioral Response: Did not attend. Patient consent form indicated patient declined all AAA services during admission.   Marykay Lex Daijah Scrivens, LRT/CTRS  Jearl Klinefelter 12/04/2012 4:42 PM
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
## 2012-12-04 NOTE — [facility] Group Notes (Signed)
[facility] LCSW Group Therapy Feelings Around Diagnosis 12/04/2012 3:46 PM  Type of Therapy:  Group Therapy  Participation Level:  Did Not Attend Karen [name] 12/04/2012, 3:46 PM
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
## 2012-12-04 NOTE — Progress Notes (Signed)
Patient stayed in bed and would not attend 0900 nurse education orientation group this morning.

## 2012-12-04 NOTE — Progress Notes (Signed)
Pt stayed in bed and read a book.

## 2012-12-04 NOTE — [facility] Suicide Risk Assessment (Signed)
Suicide Risk Assessment  Admission Assessment     Nursing information obtained from:    Demographic factors:    Current Mental Status:    Loss Factors:    Historical Factors:    Risk Reduction Factors:     CLINICAL FACTORS:   Severe Anxiety and/or Agitation Bipolar Disorder:   Depressive phase Depression:   Anhedonia Hopelessness Impulsivity Insomnia Recent sense of peace/wellbeing Severe Unstable or Poor Therapeutic Relationship Previous Psychiatric Diagnoses and Treatments Medical Diagnoses and Treatments/Surgeries  COGNITIVE FEATURES THAT CONTRIBUTE TO RISK:  Closed-mindedness Loss of executive function Polarized thinking Thought constriction (tunnel vision)    SUICIDE RISK:   Moderate:  Frequent suicidal ideation with limited intensity, and duration, some specificity in terms of plans, no associated intent, good self-control, limited dysphoria/symptomatology, some risk factors present, and identifiable protective factors, including available and accessible social support.  PLAN OF CARE: Patient is admitted for Bipolar depression with suicidal ideation and needs crisis stabilization and safety monitoring. She needs case management.    I certify that inpatient services furnished can reasonably be expected to improve the patient's condition.   Nehemiah Settle., MD 12/04/2012, 1:54 PM

## 2012-12-05 DIAGNOSIS — F319 Bipolar disorder, unspecified: Secondary | ICD-10-CM | POA: Insufficient documentation

## 2012-12-05 MED ORDER — ARIPIPRAZOLE 5 MG PO TABS
5.0000 mg | ORAL_TABLET | Freq: Every day | ORAL | Status: DC
  Administered 2012-12-05 – 2012-12-07 (×3): 5 mg via ORAL
  Filled 2012-12-05 (×6): qty 1

## 2012-12-05 MED ORDER — TOPIRAMATE 100 MG PO TABS
100.0000 mg | ORAL_TABLET | Freq: Every day | ORAL | Status: DC
  Administered 2012-12-05 – 2012-12-10 (×6): 100 mg via ORAL
  Filled 2012-12-05 (×8): qty 1

## 2012-12-05 NOTE — Progress Notes (Signed)
Patient sat down with writer to talk about her voices. She describes them as both female and female. She states that they speak clearly and that they can be very harsh at times, telling her that she "doesn't need to be here on earth." Patient unable to state what she has to live for. Patient endorses passive SI and does contract for safety. Patient given emotional support and encouragement.

## 2012-12-05 NOTE — Progress Notes (Signed)
D) Patient endorses depression and passive SI and verbally contracts for safety while on the unit. Mood depressed, affect flat with minimal eye contact. She rates depression and hopelessness at 8/10 (10 severe). Patient reports being anxious about discharge plans. Patient endorses poor appetite stating that she only ate 2% of breakfast and very little lunch. Patient is interacting with staff but isolating from milieu. (A) Patient encouraged and supported. Medications given per orders. (R) patient attended 2 out of three groups today.

## 2012-12-05 NOTE — Tx Team (Signed)
Interdisciplinary Treatment Plan Update   Date Reviewed:  12/05/2012  Time Reviewed:  10:19 AM  Progress in Treatment:   Attending groups: Yes Participating in groups: Yes Taking medication as prescribed: Yes  Tolerating medication: Yes Family/Significant other contact made:  No, but will ask patient for consent for collateral contact Patient understands diagnosis: Yes  Discussing patient identified problems/goals with staff: Yes Medical problems stabilized or resolved: Yes Denies suicidal/homicidal ideation: No, but contracts for safety Patient has not harmed self or others: Yes  For review of initial/current patient goals, please see plan of care.  Estimated Length of Stay:  5 days  Reasons for Continued Hospitalization:  Anxiety Depression Medication stabilization Suicidal ideation  New Problems/Goals identified:    Discharge Plan or Barriers:   Home with outpatient follow up  Additional Comments:  Karen [name] is an 42 y.o. female. [name] is an 42 y.o. female who presented to [facility] as a walk in with TA (mobile crises worker). She is tearful and soft spoken. She is tangential and keeps repeating she is not safe. She says she does not trust anyone and is afraid people are trying to hurt her. She is suicidal but when asked about what her plan is she replies, "I don't have one". She became upset today related to relational conflict with family and significant other. She cannot contract for safety. She has a history of multiple suicide attempts   Attendees:  Patient:  12/05/2012 10:19 AM   Signature: Mervyn Gay, MD 12/05/2012 10:19 AM  Signature:  Verne Spurr, PA 12/05/2012 10:19 AM  Signature:  Beckie Salts, RN 12/05/2012 10:19 AM  Signature:Beverly Terrilee Croak, RN 12/05/2012 10:19 AM  Signature:  12/05/2012 10:19 AM  Signature:  Juline Patch, LCSW 12/05/2012 10:19 AM  Signature:  Reyes Ivan, LCSW 12/05/2012 10:19 AM  Signature:  Maseta Dorley,Care Coordinator 12/05/2012  10:19 AM  Signature: 12/05/2012 10:19 AM  Signature:    Signature:    Signature:      Scribe for Treatment Team:   Juline Patch,  12/05/2012 10:19 AM

## 2012-12-05 NOTE — [facility] Group Notes (Signed)
[facility] LCSW Group Therapy  Emotional Regulation 1:15 - 2: 30 PM        12/05/2012  4:19 PM   Type of Therapy:  Group Therapy  Participation Level:  Appropriate  Participation Quality:  Appropriate  Affect:  Appropriate, Depressed  Cognitive:  Attentive Appropriate  Insight:  Improving/Developing Engagement in Therapy:  Developing/Improving   Modes of Intervention:  Discussion Exploration Problem-Solving Supportive  Summary of Progress/Problems:  Group topic was emotional regulations.  Patient stated she was not able to talk at this time.  Wynn Banker  12/05/2012 4:19 PM

## 2012-12-05 NOTE — Progress Notes (Signed)
Adult Psychoeducational Group Note  Date:  12/05/2012 Time:  11:00AM Group Topic/Focus:  Personal Choices and Values:   The focus of this group is to help patients assess and explore the importance of values in their lives, how their values affect their decisions, how they express their values and what opposes their expression.  Participation Level:  Did Not Attend    Additional Comments: Pt. Didn't attend group.   Bing Plume D 12/05/2012, 1:35 PM

## 2012-12-05 NOTE — [facility] Group Notes (Signed)
[facility] LCSW Aftercare Discharge Planning Group Note   12/05/2012 10:57 AM  Participation Quality:  Appropriate  Mood/Affect:  Depressed, Flat and Tearful  Depression Rating:  10  Anxiety Rating:  10  Thoughts of Suicide:  Yes  Will you contract for safety?   Yes  Current AVH:  No  Plan for Discharge/Comments:  Patient attending discharge planning group and actively participated in group.  She reports being homeless until the first of the month when she will get an apartment.  She is followed outpatient by [facility].  CSW provided all participants with daily workbook.    Transportation Means: Patient uses public transportation.   Supports: Patient reports not having a support system.   Kada Friesen, Joesph July

## 2012-12-05 NOTE — [facility] Group Notes (Signed)
Adult Psychoeducational Group Note  Date:  12/05/2012 Time:  8:56 PM  Group Topic/Focus:  Wrap-Up Group:   The focus of this group is to help patients review their daily goal of treatment and discuss progress on daily workbooks.  Participation Level:  Minimal  Participation Quality:  Drowsy  Affect:  Flat  Cognitive:  Appropriate  Insight: Improving  Engagement in Group:  Developing/Improving  Modes of Intervention:  Discussion, Exploration and Support  Additional Comments:  Pt stated that she is here because of he Depression and suicidal tendencies. Pt stated that her goal was to "get up, eat something, and go to group." which pt reports she did. When asked how her day was pt stated that is was and ok day.   Dwain Sarna P 12/05/2012, 8:56 PM

## 2012-12-05 NOTE — Progress Notes (Signed)
[facility] MD Progress Note  12/05/2012 3:20 PM Karen [name]  MRN:  [id number] Subjective:  Patient is crying inconsolably today, but states she has done well on Abilify in the past and would like to try this again. She is severely depressed, tearful, reports poor sleep, hopelessness and helplessness, suicidal ideation but no plan, no means. Diagnosis:  Bipolar I disorder, most recent episode depressed  DSM5: Schizophrenia Disorders:   Obsessive-Compulsive Disorders:   Trauma-Stressor Disorders:   Substance/Addictive Disorders:   Depressive Disorders:    ADL's:  Intact  Sleep: Fair  Appetite:  Fair  Suicidal Ideation:  + for thoughts, no plan, no intent Homicidal Ideation:  denies AEB (as evidenced by):  Psychiatric Specialty Exam: Review of Systems  Constitutional: Negative.  Negative for fever, chills, weight loss, malaise/fatigue and diaphoresis.  HENT: Negative for congestion and sore throat.   Eyes: Negative for blurred vision, double vision and photophobia.  Respiratory: Negative for cough, shortness of breath and wheezing.   Cardiovascular: Negative for chest pain, palpitations and PND.  Gastrointestinal: Negative for heartburn, nausea, vomiting, abdominal pain, diarrhea and constipation.  Musculoskeletal: Negative for myalgias, joint pain and falls.  Neurological: Negative for dizziness, tingling, tremors, sensory change, speech change, focal weakness, seizures, loss of consciousness, weakness and headaches.  Endo/Heme/Allergies: Negative for polydipsia. Does not bruise/bleed easily.  Psychiatric/Behavioral: Negative for depression, suicidal ideas, hallucinations, memory loss and substance abuse. The patient is not nervous/anxious and does not have insomnia.     Blood pressure 107/70, pulse 96, temperature 97.9 F (36.6 C), temperature source Oral, resp. rate 16, height 5\' 4"  (1.626 m), weight 78.926 kg (174 lb).Body mass index is 29.85 kg/(m^2).  General Appearance: Fairly  Groomed  Patent attorney::  Poor  Speech:  Blocked  Volume:  Decreased  Mood:  Depressed  Affect:  Labile, Tearful and crying inconsolably  Thought Process:  Goal Directed with reports of racing thoughts.  Orientation:  NA  Thought Content:  WDL  Suicidal Thoughts:  Yes.  without intent/plan  Homicidal Thoughts:  No  Memory:  NA  Judgement:  Impaired  Insight:  Lacking  Psychomotor Activity:  Normal  Concentration:  Poor  Recall:  Poor  Akathisia:  No  Handed:  Right  AIMS (if indicated):     Assets:  Desire for Improvement  Sleep:  Number of Hours: 6.75   Current Medications: Current Facility-Administered Medications  Medication Dose Route Frequency Provider Last Rate Last Dose  . acetaminophen (TYLENOL) tablet 650 mg  650 mg Oral Q6H PRN Earney Navy, NP   650 mg at 12/05/12 0836  . alum & mag hydroxide-simeth (MAALOX/MYLANTA) 200-200-20 MG/5ML suspension 30 mL  30 mL Oral Q4H PRN Earney Navy, NP      . buPROPion (WELLBUTRIN XL) 24 hr tablet 150 mg  150 mg Oral Daily Nehemiah Settle, MD   150 mg at 12/05/12 0802  . dicyclomine (BENTYL) capsule 10 mg  10 mg Oral TID WC PRN Nehemiah Settle, MD   10 mg at 12/04/12 1803  . hydrOXYzine (ATARAX/VISTARIL) tablet 50 mg  50 mg Oral QHS PRN Earney Navy, NP      . magnesium hydroxide (MILK OF MAGNESIA) suspension 30 mL  30 mL Oral Daily PRN Earney Navy, NP      . metoprolol succinate (TOPROL-XL) 24 hr tablet 25 mg  25 mg Oral Daily Kerry Hough, PA-C   25 mg at 12/05/12 6295  . pantoprazole (PROTONIX) EC tablet 40 mg  40 mg Oral Daily Kerry Hough, PA-C   40 mg at 12/05/12 2956  . risperiDONE (RISPERDAL) tablet 1 mg  1 mg Oral QHS Nehemiah Settle, MD   1 mg at 12/04/12 2209  . topiramate (TOPAMAX) tablet 100 mg  100 mg Oral Daily Verne Spurr, PA-C   100 mg at 12/05/12 1256    Lab Results:  Results for orders placed during the hospital encounter of 12/03/12 (from the past 48  hour(s))  TSH     Status: None   Collection Time    12/04/12  6:25 AM      Result Value Range   TSH 1.227  0.350 - 4.500 uIU/mL   Comment: Performed at [facility]    Physical Findings: AIMS: Facial and Oral Movements Muscles of Facial Expression: None, normal Lips and Perioral Area: None, normal Jaw: None, normal Tongue: None, normal,Extremity Movements Upper (arms, wrists, hands, fingers): None, normal Lower (legs, knees, ankles, toes): None, normal, Trunk Movements Neck, shoulders, hips: None, normal, Overall Severity Severity of abnormal movements (highest score from questions above): None, normal Incapacitation due to abnormal movements: None, normal Patient's awareness of abnormal movements (rate only patient's report): No Awareness, Dental Status Current problems with teeth and/or dentures?: No Does patient usually wear dentures?: No  CIWA:  CIWA-Ar Total: 3 COWS:  COWS Total Score: 3  Treatment Plan Summary: Daily contact with patient to assess and evaluate symptoms and progress in treatment Medication management  Plan: 1. Continue crisis management and stabilization. 2. Medication management to reduce current symptoms to base line and improve patient's overall level of functioning 3. Treat health problems as indicated. 4. Develop treatment plan to decrease risk of relapse upon discharge and the need for readmission. 5. Psycho-social education regarding relapse prevention and self care. 6. Health care follow up as needed for medical problems. 7. Continue home medications where appropriate. 8. Will start Abilify 5mg  po qd for depression and anxiety. 9. ELOS: 3 days.   Medical Decision Making Problem Points:  Established problem, stable/improving (1) Data Points:  Review or order medicine tests (1)  I certify that inpatient services furnished can reasonably be expected to improve the patient's condition.  Rona Ravens. [place] RPAC 3:26  PM 12/05/2012  Reviewed the information documented and agree with the treatment plan.  Dwane Andres,JANARDHAHA R. 12/05/2012 6:08 PM

## 2012-12-05 NOTE — Progress Notes (Signed)
INITIAL NUTRITION ASSESSMENT  DOCUMENTATION CODES Per approved criteria  -Not Applicable   INTERVENTION: - Patient educated on nutrition for IBS, including limiting high fat and fiber foods. She was encouraged to pay attention to symptoms to determine which foods are tolerated.   NUTRITION DIAGNOSIS: Inadequate oral intake related to GI issues as evidenced by pt report.   Goal: Patient will meet >/=90% of estimated nutrition needs  Monitor:  PO intake  Reason for Assessment: Malnutrition screening  42 y.o. female  ASSESSMENT: Patient with bipolar disorder. She reports that her appetite fluctuates, but her weight has been stable. She has GI issues with IBS, but doesn't know what foods cause symptoms. She is not eating much currently.   Height: Ht Readings from Last 1 Encounters:  12/03/12 5\' 4"  (1.626 m)    Weight: Wt Readings from Last 1 Encounters:  12/03/12 174 lb (78.926 kg)    Wt Readings from Last 10 Encounters:  12/03/12 174 lb (78.926 kg)  11/15/12 178 lb (80.74 kg)    BMI:  Body mass index is 29.85 kg/(m^2). Patient is overweight  Estimated Nutritional Needs: Kcal: 25-30 kcal/kg Protein: >1 g/kg Fluid: 1 ml/kg  Diet Order: Jana Half, RD, LDN Pager #: [phone] After-Hours Pager #: [phone]

## 2012-12-06 NOTE — Progress Notes (Signed)
Recreation Therapy Notes  Date: 08.20.2014 Time: 3:00pm Location: [address] Dayroom  Group Topic: Communication, Team Building, Problem Solving  Goal Area(s) Addresses:  Patient will be able to recognize use of communication, team building and problem solving during course of group activity. Patient will verbalize need for communication, team building and problem solving to make group activity successful.  Patient will verbalize benefit of communication, team building and problem solving to post d/c goals.   Behavioral Response: Passive Listener, Mute  Intervention: Problem Solving Activity  Activity: Life Boat. Patients were given a scenario about being caught on a sinking ship. In this scenario patients were informed all members of group session, in addition to 8 individuals off of a list of 15 could fit on the life boat. List of individuals included people from all socioeconomic status, for example: President Obama, Midwife, Runner, broadcasting/film/video, Education officer, museum.    Education: Life Skills, Discharge Planning, Relapse Prevention  Education Outcome: Acknowledges understanding  Clinical Observations/Feedback: Patient attended group, but made no contributions to group discussions or group activity. Patient was observed to stare blankly in front of her or off in the distance for majority of group session.   Following group LRT spoke to patient 1:1, however patient made no verbal statements, communicating with hand signals and head nods.   Marykay Lex Charise Leinbach, LRT/CTRS  Terissa Haffey L 12/06/2012 8:55 AM

## 2012-12-06 NOTE — Progress Notes (Signed)
Adult Psychoeducational Group Note  Date:  12/06/2012 Time:  9:00AM Group Topic/Focus:  Morning Wellness Group  Participation Level:  Active  Participation Quality:  Appropriate and Attentive  Affect:  Appropriate  Cognitive:  Alert and Appropriate  Insight: Appropriate  Engagement in Group:  Engaged  Modes of Intervention:  Discussion  Additional Comments:   Pt. Was attentive and appropriate during Morning Wellness group with nursing staff.   Bing Plume D 12/06/2012, 10:03 AM

## 2012-12-06 NOTE — Progress Notes (Signed)
Patient ID: Karen [name], female   DOB: [birth year], 42 y.o.   MRN: [id number] D: Patient stated she is doing well. Pt mood/affect seems anxious.  Pt rates her anxiety as 9 from a 0-10 scale. Pt stated she is not sure what she will do/live once discharged. She reports not having any support system and does want to be a burden on her children.  Pt denies SI/HI/AVH and pain. Pt attended evening karaoke group and was supportive of peers. Pt denies any needs or concerns. Cooperative with assessment. No acute distressed noted at this time.   A: Met with pt 1:1. Medications administered as prescribed. Writer encouraged pt to discuss feelings. Pt encouraged to come to staff with any question or concerns. 15 minutes checks for safety.   R: Patient remains safe. She is complaint with medications and denies any adverse reaction. Continue current POC.

## 2012-12-06 NOTE — Progress Notes (Addendum)
D:  Patient's self inventory sheet, patient has fair sleep, improving appetite, normal energy level, improving attention span.  Rated depression and hopelessness #7.  Denied withdrawals.s  SI off/on, contracts for safety.  Has experienced stomach ache and headache in past 24 hours.  Worst pain #5, zero pain goal.  After discharge, plans to take care of herself.  Does hear voices "I'm no good."  Does have discharge plans.  No problems taking meds after discharge. A:  Medications administered per MD orders.  Emotional support and encouragement given patient. R:  SI off/on, contracts for safety.  Denied A/V hallucinations.  Denied HI.  Will continue to monitor for safety with 15 minute checks.  Safety maintained.  Feels she continues to tell people the same thing.    Patient has been up and going to groups today.  Has been laughing and talking to peers/staff.  Stated she did not want anyone to visit her while at [facility] and did not want any phone calls.  Changed patient's number to MRN [id number] instead of A number per patient's request and charge nurse approval.  Patient stated she did not want anyone in this area that she wants to contact while at [facility].

## 2012-12-06 NOTE — [facility] Suicide Risk Assessment (Signed)
[facility] INPATIENT:  Family/Significant Other Suicide Prevention Education  Suicide Prevention Education:  Patient Refusal for Family/Significant Other Suicide Prevention Education: The patient Karen [name] has refused to provide written consent for family/significant other to be provided Family/Significant Other Suicide Prevention Education during admission and/or prior to discharge.  Physician notified.  Patient reports no family involvement. Wynn Banker 12/06/2012, 11:20 AM

## 2012-12-06 NOTE — Progress Notes (Signed)
Adult Psychoeducational Group Note  Date:  12/06/2012 Time:  2:37 PM  Group Topic/Focus:  Overcoming Stress:   The focus of this group is to define stress and help patients assess their triggers.  Participation Level:  Active  Participation Quality:  Appropriate  Affect:  Appropriate  Cognitive:  Appropriate and Oriented  Insight: Good and Improving  Engagement in Group:  Engaged  Modes of Intervention:  Discussion, Education and Support  Additional Comments:  Pt plans to change her phone number as one way to handle stress.  Reynolds Bowl 12/06/2012, 2:37 PM

## 2012-12-06 NOTE — Clinical Social Work Note (Signed)
Writer met with patient who advised of being a little better today.  She denies SI/HI.  Patient consented for follow up with [facility].  No social work needs identified.

## 2012-12-06 NOTE — Progress Notes (Signed)
Patient ID: Karen [name], female   DOB: Jul 30, 1970, 42 y.o.   MRN: [id number] [facility] MD Progress Note  12/06/2012 2:07 PM Naylin Burkle  MRN:  [id number]  Subjective:  Patient has been adjusting to her current medication and has denied adverse effects. She stated that she has less racing thoughts, and feels calmer. She has rated her depression 5/10 and anxiety 5/10 and suicidal thoughts with out plans. Patient has been in contact with [facility] team who will provide support in community, case manager is providing the follow up and out patient care arrangements. She has no crying episodes today. She has slept fine with some disturbance and feels hopelessness and helplessness about few times and seeking support from staff RN. She has inner voice is telling that "you have no body and you are nobody which made her felt down and suicidal.   Diagnosis:  Bipolar I disorder, most recent episode depressed  DSM5: Schizophrenia Disorders:   Obsessive-Compulsive Disorders:   Trauma-Stressor Disorders:   Substance/Addictive Disorders:   Depressive Disorders:    ADL's:  Intact  Sleep: Fair  Appetite:  Fair  Suicidal Ideation:  + for thoughts, no plan, no intent Homicidal Ideation:  denies AEB (as evidenced by):  Psychiatric Specialty Exam: Review of Systems  Constitutional: Negative.  Negative for fever, chills, weight loss, malaise/fatigue and diaphoresis.  HENT: Negative for congestion and sore throat.   Eyes: Negative for blurred vision, double vision and photophobia.  Respiratory: Negative for cough, shortness of breath and wheezing.   Cardiovascular: Negative for chest pain, palpitations and PND.  Gastrointestinal: Negative for heartburn, nausea, vomiting, abdominal pain, diarrhea and constipation.  Musculoskeletal: Negative for myalgias, joint pain and falls.  Neurological: Negative for dizziness, tingling, tremors, sensory change, speech change, focal weakness, seizures, loss  of consciousness, weakness and headaches.  Endo/Heme/Allergies: Negative for polydipsia. Does not bruise/bleed easily.  Psychiatric/Behavioral: Negative for depression, suicidal ideas, hallucinations, memory loss and substance abuse. The patient is not nervous/anxious and does not have insomnia.     Blood pressure 111/78, pulse 99, temperature 98.4 F (36.9 C), temperature source Oral, resp. rate 16, height 5\' 4"  (1.626 m), weight 78.926 kg (174 lb).Body mass index is 29.85 kg/(m^2).  General Appearance: Fairly Groomed  Patent attorney::  Poor  Speech:  Blocked  Volume:  Decreased  Mood:  Depressed  Affect:  Labile, Tearful and crying inconsolably  Thought Process:  Goal Directed with reports of racing thoughts.  Orientation:  NA  Thought Content:  WDL  Suicidal Thoughts:  Yes.  without intent/plan  Homicidal Thoughts:  No  Memory:  NA  Judgement:  Impaired  Insight:  Lacking  Psychomotor Activity:  Normal  Concentration:  Poor  Recall:  Poor  Akathisia:  No  Handed:  Right  AIMS (if indicated):     Assets:  Desire for Improvement  Sleep:  Number of Hours: 6.25   Current Medications: Current Facility-Administered Medications  Medication Dose Route Frequency Provider Last Rate Last Dose  . acetaminophen (TYLENOL) tablet 650 mg  650 mg Oral Q6H PRN Earney Navy, NP   650 mg at 12/05/12 0836  . alum & mag hydroxide-simeth (MAALOX/MYLANTA) 200-200-20 MG/5ML suspension 30 mL  30 mL Oral Q4H PRN Earney Navy, NP      . ARIPiprazole (ABILIFY) tablet 5 mg  5 mg Oral Daily Verne Spurr, PA-C   5 mg at 12/06/12 7829  . buPROPion (WELLBUTRIN XL) 24 hr tablet 150 mg  150 mg Oral Daily Tomasita Crumble  Filbert Schilder, MD   150 mg at 12/06/12 0754  . dicyclomine (BENTYL) capsule 10 mg  10 mg Oral TID WC PRN Nehemiah Settle, MD   10 mg at 12/04/12 1803  . hydrOXYzine (ATARAX/VISTARIL) tablet 50 mg  50 mg Oral QHS PRN Earney Navy, NP      . magnesium hydroxide (MILK OF  MAGNESIA) suspension 30 mL  30 mL Oral Daily PRN Earney Navy, NP      . metoprolol succinate (TOPROL-XL) 24 hr tablet 25 mg  25 mg Oral Daily Kerry Hough, PA-C   25 mg at 12/06/12 0753  . pantoprazole (PROTONIX) EC tablet 40 mg  40 mg Oral Daily Kerry Hough, PA-C   40 mg at 12/06/12 0754  . topiramate (TOPAMAX) tablet 100 mg  100 mg Oral Daily Verne Spurr, PA-C   100 mg at 12/06/12 1610    Lab Results:  No results found for this or any previous visit (from the past 48 hour(s)).  Physical Findings: AIMS: Facial and Oral Movements Muscles of Facial Expression: None, normal Lips and Perioral Area: None, normal Jaw: None, normal Tongue: None, normal,Extremity Movements Upper (arms, wrists, hands, fingers): None, normal Lower (legs, knees, ankles, toes): None, normal, Trunk Movements Neck, shoulders, hips: None, normal, Overall Severity Severity of abnormal movements (highest score from questions above): None, normal Incapacitation due to abnormal movements: None, normal Patient's awareness of abnormal movements (rate only patient's report): No Awareness, Dental Status Current problems with teeth and/or dentures?: No Does patient usually wear dentures?: No  CIWA:  CIWA-Ar Total: 3 COWS:  COWS Total Score: 2  Treatment Plan Summary: Daily contact with patient to assess and evaluate symptoms and progress in treatment Medication management  Plan: 1. Continue crisis management and stabilization. 2. Medication management to reduce current symptoms to base line and improve patient's overall level of functioning. Continue current medication management which she is adjusting. 3. Treat health problems as indicated. 4. Develop treatment plan to decrease risk of relapse upon discharge and the need for readmission. 5. Psycho-social education regarding relapse prevention and self care. 6. Health care follow up as needed for medical problems. 7. Continue home medications where  appropriate. 8. ELOS: 3 days.   Medical Decision Making Problem Points:  Established problem, stable/improving (1) Data Points:  Review or order medicine tests (1)  I certify that inpatient services furnished can reasonably be expected to improve the patient's condition.   Nehemiah Settle., MD 12/06/2012 2:07 PM

## 2012-12-06 NOTE — ED Provider Notes (Signed)
Medical screening examination/treatment/procedure(s) were performed by non-physician practitioner and as supervising physician I was immediately available for consultation/collaboration.   Ninamarie Keel L Hadlyn Amero, MD 12/06/12 1157 

## 2012-12-06 NOTE — Progress Notes (Signed)
Adult Psychoeducational Group Note  Date:  12/06/2012 Time:  11:00AM Group Topic/Focus:  Leisure and LIfestyle Changes  Participation Level:  Active  Participation Quality:  Appropriate and Attentive  Affect:  Appropriate  Cognitive:  Alert and Appropriate  Insight: Appropriate  Engagement in Group:  Engaged  Modes of Intervention:  Discussion  Additional Comments:  Pt. Was attentive and appropriate during today's group discussion. Pt. Was able to complete worksheet on Self-Esteem. Pt was was able to write a list of positive things about yourself for each letter A-Z. Pt. Was able to come up with the following words lively, healthy,and transforming.    Bing Plume D 12/06/2012, 1:42 PM

## 2012-12-06 NOTE — Progress Notes (Signed)
Patient ID: Karen [name], female   DOB: 06-26-1970, 42 y.o.   MRN: [id number] Patient asleep; no s/s of distress noted at this time. Respirations regular and unlabored.

## 2012-12-06 NOTE — [facility] Group Notes (Signed)
[facility] LCSW Group Therapy  12/06/2012  1:15 PM   Type of Therapy:  Group Therapy  Participation Level:  Active  Participation Quality:  Appropriate and Attentive  Affect:  Appropriate, Flat and Depressed  Cognitive:  Alert and Appropriate  Insight:  Developing/Improving and Engaged  Engagement in Therapy:  Developing/Improving and Engaged  Modes of Intervention:  Activity, Clarification, Confrontation, Discussion, Education, Exploration, Limit-setting, Orientation, Problem-solving, Rapport Building, Reality Testing, Socialization and Support  Summary of Progress/Problems: Patient was attentive and engaged with speaker from [employer].  Patient was attentive to speaker while they shared their story of dealing with mental health and overcoming it.  Patient expressed interest in their programs and services and received information on their agency.  Patient processed ways they can relate to the speaker.     Paolo Okane Horton, LCSWA 12/06/2012 1:42 PM   

## 2012-12-07 MED ORDER — ARIPIPRAZOLE 10 MG PO TABS
10.0000 mg | ORAL_TABLET | Freq: Every day | ORAL | Status: DC
  Administered 2012-12-08 – 2012-12-10 (×3): 10 mg via ORAL
  Filled 2012-12-07 (×5): qty 1

## 2012-12-07 NOTE — [facility] Group Notes (Signed)
[facility] LCSW Aftercare Discharge Planning Group Note   12/07/2012 11:55 AM    Participation Quality:  Appropraite  Mood/Affect:  Appropriate, Depressed  Depression Rating:  3  Anxiety Rating:  6  Thoughts of Suicide:  No  Will you contract for safety?   NA  Current AVH:  No  Plan for Discharge/Comments:  Patient attending discharge planning group and actively participated in group. Patient reports being uncertain where she will live but will follow up with [facility]. CSW provided all participants with daily workbook and information on services offered by [facility] [place].   Transportation Means: Patient has transportation.   Supports:  Patient has a support system.   Lakara Weiland, Joesph July

## 2012-12-07 NOTE — [facility] Group Notes (Signed)
[facility] LCSW Group Therapy  Feelings Around Relapse 1:15 -2:30        12/07/2012 1:56 PM   Type of Therapy:  Group Therapy  Participation Level: Limited  Participation Quality:  Appropriate  Affect:  Appropriate, Depressed  Cognitive:  Attentive Appropriate  Insight:  Limited  Engagement in Therapy: Limited  Modes of Intervention:  Discussion Exploration Problem-Solving Supportive  Summary of Progress/Problems:  The topic for today was feelings around relapse.    Patient did not participate in discussion.  Wynn Banker 12/07/2012 1:56 PM

## 2012-12-07 NOTE — Progress Notes (Signed)
D) Pt attending the program and interacts with select peers. Affect is pleasant with interaction, otherwise flat with depressed mood. Rates her depression  And hopelessness both at a 4 and states that she continues to have thoughts of SI on and off.  A) Given support and reassurance along with praise. Given encouragement to work on her issues and her the choices she has made recently. R) Contracts for her safety on the unit.

## 2012-12-07 NOTE — Progress Notes (Signed)
Pt attended Karaoke.  

## 2012-12-07 NOTE — Progress Notes (Signed)
Patient ID: Karen [name], female   DOB: 05-09-1970, 42 y.o.   MRN: [id number] [facility] MD Progress Note  12/07/2012 8:41 PM Jazia Faraci  MRN:  [id number]  Subjective:  Jaslin is up and active in the unit milieu today. She reports feeling "so-so". States her depression is 4/10 and her anxiety is significantly higher at a 7/10. She reports no problems with her medication in term of side effects, but notes that she is not where she needs to be in terms of her mood and anxiety. Diagnosis:  Bipolar I disorder, most recent episode depressed  DSM5: Schizophrenia Disorders:   Obsessive-Compulsive Disorders:   Trauma-Stressor Disorders:   Substance/Addictive Disorders:   Depressive Disorders:    ADL's:  Intact  Sleep: Fair  Appetite:  Fair  Suicidal Ideation:  + for thoughts, no plan, no intent Homicidal Ideation:  denies AEB (as evidenced by):  Psychiatric Specialty Exam: Review of Systems  Constitutional: Negative.  Negative for fever, chills, weight loss, malaise/fatigue and diaphoresis.  HENT: Negative for congestion and sore throat.   Eyes: Negative for blurred vision, double vision and photophobia.  Respiratory: Negative for cough, shortness of breath and wheezing.   Cardiovascular: Negative for chest pain, palpitations and PND.  Gastrointestinal: Negative for heartburn, nausea, vomiting, abdominal pain, diarrhea and constipation.  Musculoskeletal: Negative for myalgias, joint pain and falls.  Neurological: Negative for dizziness, tingling, tremors, sensory change, speech change, focal weakness, seizures, loss of consciousness, weakness and headaches.  Endo/Heme/Allergies: Negative for polydipsia. Does not bruise/bleed easily.  Psychiatric/Behavioral: Negative for depression, suicidal ideas, hallucinations, memory loss and substance abuse. The patient is not nervous/anxious and does not have insomnia.     Blood pressure 107/77, pulse 96, temperature 98.1 F (36.7 C), temperature source  Oral, resp. rate 16, height 5\' 4"  (1.626 m), weight 78.926 kg (174 lb).Body mass index is 29.85 kg/(m^2).  General Appearance: Fairly Groomed  Patent attorney::  Poor  Speech:  Blocked  Volume:  Decreased  Mood:  Depressed  Affect:  Labile, Tearful and crying inconsolably  Thought Process:  Goal Directed with reports of racing thoughts.  Orientation:  NA  Thought Content:  WDL  Suicidal Thoughts:  Yes.  without intent/plan  Homicidal Thoughts:  No  Memory:  NA  Judgement:  Impaired  Insight:  Lacking  Psychomotor Activity:  Normal  Concentration:  Poor  Recall:  Poor  Akathisia:  No  Handed:  Right  AIMS (if indicated):     Assets:  Desire for Improvement  Sleep:  Number of Hours: 6   Current Medications: Current Facility-Administered Medications  Medication Dose Route Frequency Provider Last Rate Last Dose  . acetaminophen (TYLENOL) tablet 650 mg  650 mg Oral Q6H PRN Earney Navy, NP   650 mg at 12/05/12 0836  . alum & mag hydroxide-simeth (MAALOX/MYLANTA) 200-200-20 MG/5ML suspension 30 mL  30 mL Oral Q4H PRN Earney Navy, NP      . ARIPiprazole (ABILIFY) tablet 5 mg  5 mg Oral Daily Verne Spurr, PA-C   5 mg at 12/07/12 0834  . buPROPion (WELLBUTRIN XL) 24 hr tablet 150 mg  150 mg Oral Daily Nehemiah Settle, MD   150 mg at 12/07/12 0834  . dicyclomine (BENTYL) capsule 10 mg  10 mg Oral TID WC PRN Nehemiah Settle, MD   10 mg at 12/04/12 1803  . hydrOXYzine (ATARAX/VISTARIL) tablet 50 mg  50 mg Oral QHS PRN Earney Navy, NP   50 mg at 12/06/12 2211  .  magnesium hydroxide (MILK OF MAGNESIA) suspension 30 mL  30 mL Oral Daily PRN Earney Navy, NP      . metoprolol succinate (TOPROL-XL) 24 hr tablet 25 mg  25 mg Oral Daily Kerry Hough, PA-C   25 mg at 12/07/12 0834  . pantoprazole (PROTONIX) EC tablet 40 mg  40 mg Oral Daily Kerry Hough, PA-C   40 mg at 12/07/12 0834  . topiramate (TOPAMAX) tablet 100 mg  100 mg Oral Daily Verne Spurr, PA-C   100 mg at 12/07/12 1610    Lab Results:  No results found for this or any previous visit (from the past 48 hour(s)).  Physical Findings: AIMS: Facial and Oral Movements Muscles of Facial Expression: None, normal Lips and Perioral Area: None, normal Jaw: None, normal Tongue: None, normal,Extremity Movements Upper (arms, wrists, hands, fingers): None, normal Lower (legs, knees, ankles, toes): None, normal, Trunk Movements Neck, shoulders, hips: None, normal, Overall Severity Severity of abnormal movements (highest score from questions above): None, normal Incapacitation due to abnormal movements: None, normal Patient's awareness of abnormal movements (rate only patient's report): No Awareness, Dental Status Current problems with teeth and/or dentures?: No Does patient usually wear dentures?: No  CIWA:  CIWA-Ar Total: 3 COWS:  COWS Total Score: 2  Treatment Plan Summary: Daily contact with patient to assess and evaluate symptoms and progress in treatment Medication management  Plan: 1. Continue crisis management and stabilization. 2. Medication management to reduce current symptoms to base line and improve patient's overall level of functioning. Continue current medication management which she is adjusting. 3. Treat health problems as indicated. 4. Develop treatment plan to decrease risk of relapse upon discharge and the need for readmission. 5. Psycho-social education regarding relapse prevention and self care. 6. Health care follow up as needed for medical problems. 7. Continue home medications where appropriate. 8. Will increase abilify to 10mg  qd. 9. ELOS: 2 days.   Medical Decision Making Problem Points:  Established problem, stable/improving (1) Data Points:  Review or order medicine tests (1)  I certify that inpatient services furnished can reasonably be expected to improve the patient's condition.  Rona Ravens. [place] RPAC 8:56 PM 12/07/2012  Reviewed  the information documented and agree with the treatment plan.  Shell Yandow,JANARDHAHA R. 12/10/2012 5:08 PM

## 2012-12-07 NOTE — Progress Notes (Signed)
Adult Psychoeducational Group Note  Date:  12/07/2012 Time:  12:46 PM  Group Topic/Focus:  Relapse Prevention Planning:   The focus of this group is to define relapse and discuss the need for planning to combat relapse.  Participation Level:  Minimal  Participation Quality:  Attentive  Affect:  Appropriate  Cognitive:  Oriented  Insight: Good  Engagement in Group:  None  Modes of Intervention:  Discussion and Support  Additional Comments:    Reynolds Bowl 12/07/2012, 12:46 PM

## 2012-12-07 NOTE — Progress Notes (Signed)
Child/Adolescent Psychoeducational Group Note  Date:  12/07/2012 Time:  9:07 PM  Group Topic/Focus:  Wrap-Up Group:   The focus of this group is to help patients review their daily goal of treatment and discuss progress on daily workbooks.  Participation Level:  Active  Participation Quality:  Appropriate  Affect:  Appropriate  Cognitive:  Appropriate  Insight:  Appropriate  Engagement in Group:  Engaged  Modes of Intervention:  Support  Additional Comments:  Pt, seemed distant during group but then began to warm up as group went on. She stated that one good thing was that she was alive and her goal was to be more positive and to socialize more which she did achieve. She says that she plans to find a church and exercise when she leaves the hospital.   Junious Dresser 12/07/2012, 9:07 PM

## 2012-12-08 NOTE — [facility] Group Notes (Addendum)
[facility] Group Notes:  (Nursing/MHT/Case Management/Adjunct)  Date:  12/08/2012  Time:  12:41 PM  Type of Therapy:  Psychoeducational Skills  Participation Level:  Active  Participation Quality:  Appropriate  Affect:  Appropriate  Cognitive:  Appropriate  Insight:  Appropriate  Engagement in Group:  Engaged  Modes of Intervention:  Problem-solving  Summary of Progress/Problems: Pt did attend Healthy Coping Skills group, and engaged in treatment. Pt reported in group that she was overwhelmed and that she felt like no one was helping her seeing that she has no where to go after discharge. Pt also reported that she did not feel like her medication regimen was working.   Jacquelyne Balint Shanta 12/08/2012, 12:41 PM

## 2012-12-08 NOTE — Progress Notes (Signed)
Patient ID: Karen [name], female   DOB: 13-Apr-1971, 42 y.o.   MRN: [id number] [facility] MD Progress Note  12/08/2012 Karen [name]  MRN:  [id number]  Subjective:  Karen [name] is focused on "my medication isnt being changed!?" And "nobody is listening to me!" She is easily escalated and insists that she took 5mg  of Abilify this morning. She is shown the documentation that states her medication was increased to 10mg  yesterday and that she did receive 10mg . She is taken to her RN who verifies in person that she got 10mg  of Abilify this morning.  She is up and active in the unit milieu and seems to be easily drawn in to the unit "drama" that is pervasive on the [address] this weekend. Diagnosis:  Bipolar I disorder, most recent episode depressed  DSM5: Schizophrenia Disorders:   Obsessive-Compulsive Disorders:   Trauma-Stressor Disorders:   Substance/Addictive Disorders:   Depressive Disorders:    ADL's:  Intact  Sleep: Fair  Appetite:  Fair  Suicidal Ideation:  + for thoughts, no plan, no intent Homicidal Ideation:  denies AEB (as evidenced by):  Psychiatric Specialty Exam: Review of Systems  Constitutional: Negative.  Negative for fever, chills, weight loss, malaise/fatigue and diaphoresis.  HENT: Negative for congestion and sore throat.   Eyes: Negative for blurred vision, double vision and photophobia.  Respiratory: Negative for cough, shortness of breath and wheezing.   Cardiovascular: Negative for chest pain, palpitations and PND.  Gastrointestinal: Negative for heartburn, nausea, vomiting, abdominal pain, diarrhea and constipation.  Musculoskeletal: Negative for myalgias, joint pain and falls.  Neurological: Negative for dizziness, tingling, tremors, sensory change, speech change, focal weakness, seizures, loss of consciousness, weakness and headaches.  Endo/Heme/Allergies: Negative for polydipsia. Does not bruise/bleed easily.  Psychiatric/Behavioral: Negative for depression, suicidal ideas,  hallucinations, memory loss and substance abuse. The patient is not nervous/anxious and does not have insomnia.     Blood pressure 107/77, pulse 96, temperature 98.1 F (36.7 C), temperature source Oral, resp. rate 16, height 5\' 4"  (1.626 m), weight 78.926 kg (174 lb).Body mass index is 29.85 kg/(m^2).  General Appearance: Fairly Groomed  Patent attorney::  Poor  Speech:  Blocked  Volume:  Decreased  Mood:  Depressed  Affect:  Labile, Tearful and crying inconsolably  Thought Process:  Goal Directed with reports of racing thoughts.  Orientation:  NA  Thought Content:  WDL  Suicidal Thoughts:  Yes.  without intent/plan  Homicidal Thoughts:  No  Memory:  NA  Judgement:  Impaired  Insight:  Lacking  Psychomotor Activity:  Normal  Concentration:  Poor  Recall:  Poor  Akathisia:  No  Handed:  Right  AIMS (if indicated):     Assets:  Desire for Improvement  Sleep:  Number of Hours: 6   Current Medications: Current Facility-Administered Medications  Medication Dose Route Frequency Provider Last Rate Last Dose  . acetaminophen (TYLENOL) tablet 650 mg  650 mg Oral Q6H PRN Earney Navy, NP   650 mg at 12/05/12 0836  . alum & mag hydroxide-simeth (MAALOX/MYLANTA) 200-200-20 MG/5ML suspension 30 mL  30 mL Oral Q4H PRN Earney Navy, NP      . ARIPiprazole (ABILIFY) tablet 5 mg  5 mg Oral Daily Verne Spurr, PA-C   5 mg at 12/07/12 0834  . buPROPion (WELLBUTRIN XL) 24 hr tablet 150 mg  150 mg Oral Daily Nehemiah Settle, MD   150 mg at 12/07/12 0834  . dicyclomine (BENTYL) capsule 10 mg  10 mg Oral TID WC  PRN Nehemiah Settle, MD   10 mg at 12/04/12 1803  . hydrOXYzine (ATARAX/VISTARIL) tablet 50 mg  50 mg Oral QHS PRN Earney Navy, NP   50 mg at 12/06/12 2211  . magnesium hydroxide (MILK OF MAGNESIA) suspension 30 mL  30 mL Oral Daily PRN Earney Navy, NP      . metoprolol succinate (TOPROL-XL) 24 hr tablet 25 mg  25 mg Oral Daily Kerry Hough, PA-C    25 mg at 12/07/12 0834  . pantoprazole (PROTONIX) EC tablet 40 mg  40 mg Oral Daily Kerry Hough, PA-C   40 mg at 12/07/12 0834  . topiramate (TOPAMAX) tablet 100 mg  100 mg Oral Daily Verne Spurr, PA-C   100 mg at 12/07/12 0865    Lab Results:  No results found for this or any previous visit (from the past 48 hour(s)).  Physical Findings: AIMS: Facial and Oral Movements Muscles of Facial Expression: None, normal Lips and Perioral Area: None, normal Jaw: None, normal Tongue: None, normal,Extremity Movements Upper (arms, wrists, hands, fingers): None, normal Lower (legs, knees, ankles, toes): None, normal, Trunk Movements Neck, shoulders, hips: None, normal, Overall Severity Severity of abnormal movements (highest score from questions above): None, normal Incapacitation due to abnormal movements: None, normal Patient's awareness of abnormal movements (rate only patient's report): No Awareness, Dental Status Current problems with teeth and/or dentures?: No Does patient usually wear dentures?: No  CIWA:  CIWA-Ar Total: 3 COWS:  COWS Total Score: 2  Treatment Plan Summary: Daily contact with patient to assess and evaluate symptoms and progress in treatment Medication management  Plan: 1. Continue crisis management and stabilization. 2. Medication management to reduce current symptoms to base line and improve patient's overall level of functioning. Continue current medication management which she is adjusting. 3. Treat health problems as indicated. 4. Develop treatment plan to decrease risk of relapse upon discharge and the need for readmission. 5. Psycho-social education regarding relapse prevention and self care. 6. Health care follow up as needed for medical problems. 7. Continue home medications where appropriate. 8. Will continue the  abilify to 10mg  qd. 9. ELOS: 2 days.   Medical Decision Making Problem Points:  Established problem, stable/improving (1) Data Points:   Review or order medicine tests (1)  I certify that inpatient services furnished can reasonably be expected to improve the patient's condition.  Rona Ravens. Apollos Tenbrink RPAC 5:00 pm 12/08/2012

## 2012-12-08 NOTE — Progress Notes (Signed)
Adult Psychoeducational Group Note  Date:  12/08/2012 Time:  9:39 PM  Group Topic/Focus:  Wrap-Up Group:   The focus of this group is to help patients review their daily goal of treatment and discuss progress on daily workbooks.  Participation Level:  Active  Participation Quality:  Appropriate  Affect:  Appropriate  Cognitive:  Appropriate  Insight: Appropriate  Engagement in Group:  Engaged  Modes of Intervention:  Discussion  Additional Comments:  Pt attended group and was very supportive of others and engaged. Pt's goal for today was to open up more, but she also revealed that she wants to work on trusting people more. She also thanked the group for being so supportive and understanding.   [place] Shi K 12/08/2012, 9:39 PM

## 2012-12-08 NOTE — Progress Notes (Signed)
Psychoeducational Group Note  Date: 12/08/2012 Time:  1015  Group Topic/Focus:  Identifying Needs:   The focus of this group is to help patients identify their personal needs that have been historically problematic and identify healthy behaviors to address their needs.  Participation Level:  Active  Participation Quality:  Appropriate  Affect:  Appropriate  Cognitive:  Oriented  Insight:  Improving  Engagement in Group:  Engaged  Additional Comments:  Pt participated in the group, supported her peers and was supported by them.   Gladyse Corvin A 

## 2012-12-08 NOTE — Progress Notes (Signed)
D: Pt mood is depressed. She brightens upon approach. Eye contact is fair. She is attending groups and interacting well with her peers and staff members. A: Support given. Verbalization encouraged. Pt encouraged to come to staff with any concerns.  R: Pt is receptive. No complaints of pain or discomfort at this time. Q15 min monitoring maintained for safety. Will continue to monitor pt.
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
## 2012-12-08 NOTE — [facility] Group Notes (Signed)
[facility] Group Notes:  (Clinical Social Work)  12/08/2012   3:00-4:00PM  Summary of Progress/Problems:   The main focus of today's process group was for the patient to identify ways in which they have sabotaged their own mental health wellness/recovery.  There was a discussion initially about the concept of recovery, and what that meant to different patients.  Motivational interviewing was used to explore the reasons they engage in behavior that is detrimental to recovery, and reasons for wanting to change.  The group discussed at length how trust issues and self-esteem are often at the core of self-sabotaging behaviors, and how trust of self is equally important to trust of others.  CSW presented counseling as one safe place to let down one's mask and learn how to trust.  There was significant tearfulness during this group. The patient expressed frustration with trust issues, and gave 3 specific examples of things that have happened in her life as examples.  She was very tearful and received much support and encouragement from other group members and CSW.  She was encouraged to seek a new counselor whom she can develop a trusting relationship with, and that hopefully this professional can help her to learn how to trust her own self.  Type of Therapy:  Process Group  Participation Level:  Active  Participation Quality:  Appropriate, Attentive and Sharing  Affect:  Blunted and Depressed  Cognitive:  Alert, Appropriate and Oriented  Insight:  Developing/Improving  Engagement in Therapy:  Engaged  Modes of Intervention:  Education, Motivational Interviewing   Surveyor, minerals, LCSW 5:01 PM

## 2012-12-09 DIAGNOSIS — R45851 Suicidal ideations: Secondary | ICD-10-CM

## 2012-12-09 NOTE — Progress Notes (Signed)
NSG 7a-7p shift:  D:  Pt. Has been very cooperative and polite this shift but had verbalized feeling anxious and depressed earlier this shift.  She has attended all groups on the unit with good participation and appeared brighter towards end of shift.  She denies any physical complaints or side effects from her medication.  A: Support and encouragement provided.   R: Pt. receptive to intervention/s.  Safety maintained.  Joaquin Music, RN

## 2012-12-09 NOTE — [facility] Group Notes (Signed)
[facility] Group Notes:  (Nursing/MHT/Case Management/Adjunct)  Date:  12/09/2012  Time:  11:07 AM  Type of Therapy:  Psychoeducational Skills  Participation Level:  Active  Participation Quality:  Appropriate  Affect:  Appropriate  Cognitive:  Appropriate  Insight:  Appropriate  Engagement in Group:  Engaged  Modes of Intervention:  Problem-solving  Summary of Progress/Problems: Pt did attend Healthy Support Systems group, and engaged in treatment.  Jacquelyne Balint Shanta 12/09/2012, 11:07 AM

## 2012-12-09 NOTE — Progress Notes (Signed)
Patient ID: Maelle Sheaffer, female   DOB: [birth year], 42 y.o.   MRN: [id number] Psychoeducational Group Note  Date:  12/09/2012 Time:  1100am  Group Topic/Focus:  Making Healthy Choices:   The focus of this group is to help patients identify negative/unhealthy choices they were using prior to admission and identify positive/healthier coping strategies to replace them upon discharge.  Participation Level:  Active  Participation Quality:  Appropriate  Affect:  Appropriate  Cognitive:  Appropriate  Insight:  Supportive  Engagement in Group:  Supportive  Additional Comments:    Valente David 12/09/2012,1:56 PM
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
## 2012-12-09 NOTE — Progress Notes (Signed)
Adult Psychoeducational Group Note  Date:  12/09/2012 Time:  1:57 PM  Group Topic/Focus:  Conflict Resolution:   The focus of this group is to discuss the conflict resolution process and how it may be used upon discharge.  Participation Level:  None  Participation Quality:  Inattentive and Resistant  Affect:  Resistant  Cognitive:  Disorganized  Insight: None  Engagement in Group:  Poor  Modes of Intervention:  Activity   Elijio Miles 12/09/2012, 1:57 PM

## 2012-12-09 NOTE — Progress Notes (Signed)
Patient ID: Karen [name], female   DOB: 07-04-1970, 42 y.o.   MRN: [id number] Patient ID: Karen [name], female   DOB: [birth year], 42 y.o.   MRN: [id number] [facility] MD Progress Note  12/08/2012 Karen [name]  MRN:  [id number]  Subjective: "I'ts almost like I'm a little worse, because I seem to be very affected by other people's stories especially my roommate's" Karen [name] goes on to say that Karen [name], another patient on the unit has cried all day that no one loves her and all she needs is for someone to love her," states Karen [name], and " I really can't handle the drama, I need to take care of me, and this overwhelming." "I"m ready to go!" Karen [name] is alert and oriented, focused. She is goal directed, and her mood is calm. She is reporting her depression is low and rates it as a 1-2/10 and her anxiety is a bit higher at 4/10 due to the unit drama. Diagnosis:  Bipolar I disorder, most recent episode depressed  DSM5: Schizophrenia Disorders:   Obsessive-Compulsive Disorders:   Trauma-Stressor Disorders:   Substance/Addictive Disorders:   Depressive Disorders:    ADL's:  Intac Sleep: Fair  Appetite:  Fair  Suicidal Ideation:  + for thoughts, no plan, no intent Homicidal Ideation:  denies AEB (as evidenced by):  Psychiatric Specialty Exam: Review of Systems  Constitutional: Negative.  Negative for fever, chills, weight loss, malaise/fatigue and diaphoresis.  HENT: Negative for congestion and sore throat.   Eyes: Negative for blurred vision, double vision and photophobia.  Respiratory: Negative for cough, shortness of breath and wheezing.   Cardiovascular: Negative for chest pain, palpitations and PND.  Gastrointestinal: Negative for heartburn, nausea, vomiting, abdominal pain, diarrhea and constipation.  Musculoskeletal: Negative for myalgias, joint pain and falls.  Neurological: Negative for dizziness, tingling, tremors, sensory change, speech change, focal weakness, seizures, loss of consciousness,  weakness and headaches.  Endo/Heme/Allergies: Negative for polydipsia. Does not bruise/bleed easily.  Psychiatric/Behavioral: Negative for depression, suicidal ideas, hallucinations, memory loss and substance abuse. The patient is not nervous/anxious and does not have insomnia.     Blood pressure 107/77, pulse 96, temperature 98.1 F (36.7 C), temperature source Oral, resp. rate 16, height 5\' 4"  (1.626 m), weight 78.926 kg (174 lb).Body mass index is 29.85 kg/(m^2).  General Appearance: Fairly Groomed  Patent attorney::  Poor  Speech:  Blocked  Volume:  Decreased  Mood:  Depressed  Affect:  Labile, Tearful and crying inconsolably  Thought Process:  Goal Directed with reports of racing thoughts.  Orientation:  NA  Thought Content:  WDL  Suicidal Thoughts:  Yes.  without intent/plan  Homicidal Thoughts:  No  Memory:  NA  Judgement:  Impaired  Insight:  Lacking  Psychomotor Activity:  Normal  Concentration:  Poor  Recall:  Poor  Akathisia:  No  Handed:  Right  AIMS (if indicated):     Assets:  Desire for Improvement  Sleep:  Number of Hours: 6   Current Medications: Current Facility-Administered Medications  Medication Dose Route Frequency Provider Last Rate Last Dose  . acetaminophen (TYLENOL) tablet 650 mg  650 mg Oral Q6H PRN Earney Navy, NP   650 mg at 12/05/12 0836  . alum & mag hydroxide-simeth (MAALOX/MYLANTA) 200-200-20 MG/5ML suspension 30 mL  30 mL Oral Q4H PRN Earney Navy, NP      . ARIPiprazole (ABILIFY) tablet 5 mg  5 mg Oral Daily Verne Spurr, PA-C   5 mg at 12/07/12 0834  . buPROPion (WELLBUTRIN XL)  24 hr tablet 150 mg  150 mg Oral Daily Nehemiah Settle, MD   150 mg at 12/07/12 0834  . dicyclomine (BENTYL) capsule 10 mg  10 mg Oral TID WC PRN Nehemiah Settle, MD   10 mg at 12/04/12 1803  . hydrOXYzine (ATARAX/VISTARIL) tablet 50 mg  50 mg Oral QHS PRN Earney Navy, NP   50 mg at 12/06/12 2211  . magnesium hydroxide (MILK OF  MAGNESIA) suspension 30 mL  30 mL Oral Daily PRN Earney Navy, NP      . metoprolol succinate (TOPROL-XL) 24 hr tablet 25 mg  25 mg Oral Daily Kerry Hough, PA-C   25 mg at 12/07/12 0834  . pantoprazole (PROTONIX) EC tablet 40 mg  40 mg Oral Daily Kerry Hough, PA-C   40 mg at 12/07/12 0834  . topiramate (TOPAMAX) tablet 100 mg  100 mg Oral Daily Verne Spurr, PA-C   100 mg at 12/07/12 1610    Lab Results:  No results found for this or any previous visit (from the past 48 hour(s)).  Physical Findings: AIMS: Facial and Oral Movements Muscles of Facial Expression: None, normal Lips and Perioral Area: None, normal Jaw: None, normal Tongue: None, normal,Extremity Movements Upper (arms, wrists, hands, fingers): None, normal Lower (legs, knees, ankles, toes): None, normal, Trunk Movements Neck, shoulders, hips: None, normal, Overall Severity Severity of abnormal movements (highest score from questions above): None, normal Incapacitation due to abnormal movements: None, normal Patient's awareness of abnormal movements (rate only patient's report): No Awareness, Dental Status Current problems with teeth and/or dentures?: No Does patient usually wear dentures?: No  CIWA:  CIWA-Ar Total: 3 COWS:  COWS Total Score: 2  Treatment Plan Summary: Daily contact with patient to assess and evaluate symptoms and progress in treatment Medication management  Plan: 1. Continue crisis management and stabilization. 2. Medication management to reduce current symptoms to base line and improve patient's overall level of functioning. Continue current medication management which she is adjusting. 3. Treat health problems as indicated. 4. Develop treatment plan to decrease risk of relapse upon discharge and the need for readmission. 5. Psycho-social education regarding relapse prevention and self care. 6. Health care follow up as needed for medical problems. 7. Continue home medications where  appropriate. 8. Will continue the  abilify to 10mg  qd. 9. ELOS: D/C tomorrow.   Medical Decision Making Problem Points:  Established problem, stable/improving (1) Data Points:  Review or order medicine tests (1)  I certify that inpatient services furnished can reasonably be expected to improve the patient's condition.  Rona Ravens. Charlis Harner [facility] 12/09/2012 8:42 PM

## 2012-12-09 NOTE — Clinical Social Work Note (Signed)
Clinical Social Work note  Patient approached CSW after group and asked if she is required to stay in the hospital until she has a provider in place since she is homeless.  She stated she has decided to get a room for awhile, and she is willing to return to [facility] for follow-up.  She would like to discharge tomorrow (Monday) because she feels that she got a lot off her chest yesterday in group that she came here for.  She now feels it is tearing her down to sit and hear other people's stories.  CSW advised her to talk to her regular CSW in aftercare planning group tomorrow about her plans.  Ambrose Mantle, LCSW 12/09/2012, 5:18 PM

## 2012-12-09 NOTE — [facility] Group Notes (Signed)
[facility] Group Notes:  (Clinical Social Work)  12/09/2012   3:00-4:00PM  Summary of Progress/Problems:   The main focus of today's process group was to   identify the patient's current support system and decide on other supports that can be put in place.  The picture on workbook was used to discuss why additional supports are needed, and a hand-out was distributed with four definitions/levels of support, then used to talk about how patients have given and received all different kinds of support.  An emphasis was placed on using counselor, doctor, therapy groups, 12-step groups, and problem-specific support groups to expand supports.  The patient identified her current supports as being the [facility] patients.  CSW reminded her of conversation yesterday where she was advised to try another counselor.  Type of Therapy:  Process Group  Participation Level:  Minimal  Participation Quality:  Attentive  Affect:  Appropriate  Cognitive:  Appropriate and Oriented  Insight:  Developing/Improving  Engagement in Therapy:  Developing/Improving  Modes of Intervention:  Education,  Support and Processing  Ambrose Mantle, LCSW 12/09/2012, 5:15 PM

## 2012-12-10 MED ORDER — HYDROXYZINE HCL 50 MG PO TABS: 50.0000 mg | ORAL_TABLET | Freq: Every evening | ORAL | Status: DC | PRN

## 2012-12-10 MED ORDER — OMEPRAZOLE 40 MG PO CPDR: 40.0000 mg | DELAYED_RELEASE_CAPSULE | Freq: Two times a day (BID) | ORAL | Status: AC | PRN

## 2012-12-10 MED ORDER — METOPROLOL SUCCINATE ER 25 MG PO TB24: 25.0000 mg | ORAL_TABLET | Freq: Every day | ORAL | Status: DC

## 2012-12-10 MED ORDER — DICYCLOMINE HCL 10 MG PO CAPS: 10.0000 mg | ORAL_CAPSULE | Freq: Three times a day (TID) | ORAL | Status: DC | PRN

## 2012-12-10 MED ORDER — BUPROPION HCL ER (XL) 150 MG PO TB24: 150.0000 mg | ORAL_TABLET | Freq: Every day | ORAL | Status: DC

## 2012-12-10 MED ORDER — TOPIRAMATE 100 MG PO TABS: 100.0000 mg | ORAL_TABLET | Freq: Every day | ORAL | Status: DC

## 2012-12-10 MED ORDER — ARIPIPRAZOLE 10 MG PO TABS: 10.0000 mg | ORAL_TABLET | Freq: Every day | ORAL | Status: DC

## 2012-12-10 NOTE — Progress Notes (Signed)
D:  Patient's self inventory sheet, patient has fair sleep, improving appetite, normal energy level, improving attention span.  Rated depression and hopelessness #1.  Denied withdrawals.  Denied SI.  Has experienced headache, stomach ache in past 24 hours.   Worst pain #5.  After discharge, plans to take meds, go to appointments, focus on herself, support groups.    "Thank you!"  Does have discharge plans.  No problems taking meds after discharge. A:  Medications administered per MD orders.  Emotional support and encouragement given patient. R:  Denied SI and HI.   Denied A/V hallucinations.  Denied pain.  Will continue to monitor patient for safety with 15 minute checks.  Safety maintained.

## 2012-12-10 NOTE — Progress Notes (Signed)
[facility] Adult Case Management Discharge Plan :  Will you be returning to the same living situation after discharge: Yes,  Patient reports having a place to stay at discharge. At discharge, do you have transportation home?:Yes,  Patient will arrange transporatation. Do you have the ability to pay for your medications:Yes,  Patient can afford medications.  Release of information consent forms completed and in the chart;  Patient's signature needed at discharge.  Patient to Follow up at: Follow-up Information   Follow up with [facility] On 12/11/2012. (Please go to [facility] Tuesday, December 11, 2012 or any weekday between 8AM - 3PM for medication managment)    Contact information:   [address] [place], [place]   [address]  [phone]      Patient denies SI/HI:   Patient no longer endorsing SI/HI or other thoughts of self harm.     Safety Planning and Suicide Prevention discussed:  .Reviewed with all patients during discharge planning group  Deicy Rusk, [place] July 12/10/2012, 10:38 AM

## 2012-12-10 NOTE — Tx Team (Signed)
Interdisciplinary Treatment Plan Update   Date Reviewed:  12/10/2012  Time Reviewed:  9:55 AM  Progress in Treatment:   Attending groups: Yes Participating in groups: Yes Taking medication as prescribed: Yes  Tolerating medication: Yes Family/Significant other contact made:  No, patient declined collateral contact Patient understands diagnosis: Yes  Discussing patient identified problems/goals with staff: Yes Medical problems stabilized or resolved: Yes Denies suicidal/homicidal ideation: Yes Patient has not harmed self or others: Yes  For review of initial/current patient goals, please see plan of care.  Estimated Length of Stay:  Discharge home today  Reasons for Continued Hospitalization:   New Problems/Goals identified:    Discharge Plan or Barriers:   Home with outpatient follow up at [facility]  Additional Comments:  N/A  Attendees:  Patient:  12/10/2012 9:55 AM   Signature: Mervyn Gay, MD 12/10/2012 9:55 AM  Signature:  Neill Loft, RN 12/10/2012 9:55 AM  Signature:  Carolynn Comment, RN 12/10/2012 9:55 AM  Signature: Quintella Reichert, RN 12/10/2012 9:55 AM  Signature:  Eino Farber, RN 12/10/2012 9:55 AM  Signature:  Juline Patch, LCSW 12/10/2012 9:55 AM  Signature:  Reyes Ivan, LCSW 12/10/2012 9:55 AM  Signature:  Sharin Grave Coordinator 12/10/2012 9:55 AM  Signature: 12/10/2012 9:55 AM  Signature:    Signature:    Signature:      Scribe for Treatment Team:   Juline Patch,  12/10/2012 9:55 AM

## 2012-12-10 NOTE — Progress Notes (Signed)
The focus of this group is to help patients review their daily goal of treatment and discuss progress on daily workbooks.  During wrap up group tonight, Karen [name] stated that her day today had been "up and down" because she felt too much empathy for others and it brought her down. When asked about what kind of support system the patient plans to use when discharged from the hospital, patient stated that she plans to be in contact with some peers from [facility] and that [name] is going to be a big help to her. When asked to name two positives about herself, patient stated that she is trustworthy, but that she could not think of another positive.

## 2012-12-10 NOTE — Progress Notes (Signed)
Patient ID: Karen [name], female   DOB: [birth year], 42 y.o.   MRN: [id number] D)  Has been in the dayroom most of the evening, interacting with peers, watching tv.  Seems brighter today, smiling, pleasant, stated still hasn't been able to connect with her son but will try again tomorrow.. Attended group, came to med window after snack and was nearly ready for bed, but came back out to the dayroom, said she wasn't sure she would be able to stay in her room.  Stated she could smell her roommate and she smelled unhealthy.  Roommate has serious health issues and spent the day in bed.  Went back to her room later and thought she could deal with it. A)  Will continue to monitor for safety, continue POC R)  safety maintained,.

## 2012-12-10 NOTE — [facility] Group Notes (Signed)
[facility] LCSW Aftercare Discharge Planning Group Note   12/10/2012 10:38 AM    Participation Quality:  Appropraite  Mood/Affect:  Appropriate  Depression Rating:  0  Anxiety Rating:  0  Thoughts of Suicide:  No  Will you contract for safety?   NA  Current AVH:  No  Plan for Discharge/Comments:  Patient attending discharge planning group and actively participated in group.  She will follow up with [facility] for outpatient servcies.  CSW provided all participants with daily workbook and information on services offered by [facility].   Transportation Means: Patient has transportation.   Supports:  Patient has a support system.   Karen [name], Karen [name]

## 2012-12-10 NOTE — [facility] Suicide Risk Assessment (Signed)
Suicide Risk Assessment  Discharge Assessment     Demographic Factors:  Adolescent or young adult, Low socioeconomic status, Living alone and Unemployed  Mental Status Per Nursing Assessment::   On Admission:     Current Mental Status by Physician: Patient is calm pleasant and cooperative. Patient reported he has a good mood with appropriate and bright affect. Patient is normal speech. Patient has deniedd  suicciiddaal/homicidal ideations intentions and plans. Patient has no evidence of psychotic symptoms. Patient has fair insight judgment and impulse control.  Loss Factors: Financial problems/change in socioeconomic status  Historical Factors: Prior suicide attempts and Impulsivity  Risk Reduction Factors:   Sense of responsibility to family, Religious beliefs about death, Positive social support, Positive therapeutic relationship and Positive coping skills or problem solving skills  Continued Clinical Symptoms:  Bipolar Disorder:   Depressive phase Depression:   Recent sense of peace/wellbeing Previous Psychiatric Diagnoses and Treatments Medical Diagnoses and Treatments/Surgeries  Cognitive Features That Contribute To Risk:  Polarized thinking    Suicide Risk:  Minimal: No identifiable suicidal ideation.  Patients presenting with no risk factors but with morbid ruminations; may be classified as minimal risk based on the severity of the depressive symptoms  Discharge Diagnoses:   AXIS I:  Bipolar, Depressed AXIS II:  Deferred AXIS III:   Past Medical History  Diagnosis Date  . Hypertension   . Migraine   . IBS (irritable bowel syndrome)   . Bipolar affective disorder   . PTSD (post-traumatic stress disorder)   . Pulmonary embolism   . DVT (deep venous thrombosis)   . Bowel obstruction    AXIS IV:  economic problems, housing problems, occupational problems, other psychosocial or environmental problems, problems related to social environment and problems with primary  support group AXIS V:  51-60 moderate symptoms  Plan Of Care/Follow-up recommendations:  Activity:  As tolerated Diet:  Regular  Is patient on multiple antipsychotic therapies at discharge:  No   Has Patient had three or more failed trials of antipsychotic monotherapy by history:  No  Recommended Plan for Multiple Antipsychotic Therapies: NA  Nehemiah Settle., M.D. 12/10/2012, 1:44 PM

## 2012-12-10 NOTE — Progress Notes (Signed)
Discharge Note:  Patient discharged home with ride.  Denied SI and HI.  Denied A/V hallucinations.  Denied pain.  Stated she appreciated all assistance received from [facility] staff.  Patient stated she received all her belongings, clothing, shoes, toiletries, miscellaneous items, computers, bags, wallet, money, prescriptions, medications.  Suicide prevention information received and discussed with patient, who stated she understood and had no questions.
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
## 2012-12-12 NOTE — Progress Notes (Signed)
Agree with assessment and plan Geraldina Parrott A. Doil Kamara, M.D. 

## 2012-12-12 NOTE — Progress Notes (Signed)
Agree with assessment and plan Malaisha Silliman A. Sebastin Perlmutter, M.D. 

## 2012-12-14 NOTE — Progress Notes (Addendum)
Patient Discharge Instructions:  After Visit Summary (AVS):   Faxed to:  12/14/12 Discharge Summary Note:   Faxed to:  12/14/12 Psychiatric Admission Assessment Note:   Faxed to:  12/14/12 Suicide Risk Assessment - Discharge Assessment:   Faxed to:  12/14/12 Faxed/Sent to the Next Level Care provider:  12/14/12 Faxed to [facility] @ [phone]  Jerelene Redden, 12/14/2012, 2:52 PM

## 2013-04-26 ENCOUNTER — Encounter (HOSPITAL_COMMUNITY): Payer: Self-pay | Admitting: Emergency Medicine

## 2013-04-26 ENCOUNTER — Emergency Department (INDEPENDENT_AMBULATORY_CARE_PROVIDER_SITE_OTHER)
Admission: EM | Admit: 2013-04-26 | Discharge: 2013-04-26 | Disposition: A | Discharge status: Home / Self Care | Payer: Medicare Other | Source: Home / Self Care | Attending: Family Medicine | Admitting: Family Medicine

## 2013-04-26 DIAGNOSIS — M549 Dorsalgia, unspecified: Secondary | ICD-10-CM

## 2013-04-26 LAB — CBC WITH DIFFERENTIAL/PLATELET
BASOS ABS: 0.1 10*3/uL (ref 0.0–0.1)
Basophils Relative: 1 % (ref 0–1)
EOS ABS: 0 10*3/uL (ref 0.0–0.7)
Eosinophils Relative: 0 % (ref 0–5)
HCT: 37.6 % (ref 36.0–46.0)
Hemoglobin: 13 g/dL (ref 12.0–15.0)
LYMPHS ABS: 2.4 10*3/uL (ref 0.7–4.0)
Lymphocytes Relative: 45 % (ref 12–46)
MCH: 32.3 pg (ref 26.0–34.0)
MCHC: 34.6 g/dL (ref 30.0–36.0)
MCV: 93.3 fL (ref 78.0–100.0)
MONO ABS: 0.5 10*3/uL (ref 0.1–1.0)
Monocytes Relative: 9 % (ref 3–12)
NEUTROS PCT: 45 % (ref 43–77)
Neutro Abs: 2.3 10*3/uL (ref 1.7–7.7)
PLATELETS: 253 10*3/uL (ref 150–400)
RBC: 4.03 MIL/uL (ref 3.87–5.11)
RDW: 12.3 % (ref 11.5–15.5)
WBC: 5.3 10*3/uL (ref 4.0–10.5)

## 2013-04-26 LAB — POCT URINALYSIS DIP (DEVICE)
GLUCOSE, UA: NEGATIVE mg/dL
Hgb urine dipstick: NEGATIVE
Ketones, ur: NEGATIVE mg/dL
LEUKOCYTES UA: NEGATIVE
NITRITE: NEGATIVE
Protein, ur: NEGATIVE mg/dL
Specific Gravity, Urine: >=1.03 (ref 1.005–1.030)
UROBILINOGEN UA: 0.2 mg/dL (ref 0.0–1.0)
pH: 6 (ref 5.0–8.0)

## 2013-04-26 LAB — POCT I-STAT, CHEM 8
BUN: 6 mg/dL (ref 6–23)
CREATININE: 0.8 mg/dL (ref 0.50–1.10)
Calcium, Ion: 1.27 mmol/L — ABNORMAL HIGH (ref 1.12–1.23)
Chloride: 102 mEq/L (ref 96–112)
Glucose, Bld: 103 mg/dL — ABNORMAL HIGH (ref 70–99)
HEMATOCRIT: 43 % (ref 36.0–46.0)
HEMOGLOBIN: 14.6 g/dL (ref 12.0–15.0)
POTASSIUM: 3.9 meq/L (ref 3.7–5.3)
SODIUM: 138 meq/L (ref 137–147)
TCO2: 23 mmol/L (ref 0–100)

## 2013-04-26 LAB — POCT PREGNANCY, URINE: PREG TEST UR: NEGATIVE

## 2013-04-26 MED ORDER — CYCLOBENZAPRINE HCL 10 MG PO TABS: 10.0000 mg | ORAL_TABLET | Freq: Three times a day (TID) | ORAL | Status: DC | PRN

## 2013-04-26 NOTE — ED Provider Notes (Signed)
CSN: [id number]     Arrival date & time 04/26/13  [id number] History   First MD Initiated Contact with Patient 04/26/13 1008     Chief Complaint  Patient presents with  . Back Pain   (Consider location/radiation/quality/duration/timing/severity/associated sxs/prior Treatment) HPI Comments: 43 year old female presents with numerous worsening of chronic pain complaints. She complains of back pain, muscle pain, headaches, and sensation of pins and needles and random spots over her body. She has been taking numerous over-the-counter medications that have not seemed to help. She describes the pain in her back as feeling like someone is grabbing her spine and twisting. She also complains of hypersensitivity and pain in all her muscles at times. She previously had epidural steroid injections in her back for pain which did help but now the pain is starting to come back. She was told by her previous physician that she may have fibromyalgia.  Patient is a 43 y.o. female presenting with back pain.  Back Pain Associated symptoms: headaches and numbness   Associated symptoms: no abdominal pain, no chest pain, no dysuria, no fever and no weakness     Past Medical History  Diagnosis Date  . Hypertension   . Migraine   . IBS (irritable bowel syndrome)   . Bipolar affective disorder   . PTSD (post-traumatic stress disorder)   . Pulmonary embolism   . DVT (deep venous thrombosis)   . Bowel obstruction    Past Surgical History  Procedure Laterality Date  . Abdominal surgery    . Abdominal hysterectomy    . Knee arthroscopy    . Appendectomy    . Cholecystectomy     History reviewed. No pertinent family history. History  Substance Use Topics  . Smoking status: Never Smoker   . Smokeless tobacco: Not on file  . Alcohol Use: Yes     Comment: drinks 1-2 times monthly   OB History   Grav Para Term Preterm Abortions TAB SAB Ect Mult Living                 Review of Systems  Constitutional: Negative  for fever and chills.  Eyes: Negative for visual disturbance.  Respiratory: Negative for cough and shortness of breath.   Cardiovascular: Negative for chest pain, palpitations and leg swelling.  Gastrointestinal: Negative for nausea, vomiting and abdominal pain.  Endocrine: Negative for polydipsia and polyuria.  Genitourinary: Negative for dysuria, urgency and frequency.  Musculoskeletal: Positive for arthralgias, back pain and myalgias.  Skin: Negative for rash.  Neurological: Positive for numbness and headaches. Negative for dizziness, weakness and light-headedness.    Allergies  Aspirin; Benadryl; Ciprofloxacin hcl; Morphine and related; and Sulfa antibiotics  Home Medications   Current Outpatient Rx  Name  Route  Sig  Dispense  Refill  . ARIPiprazole (ABILIFY) 10 MG tablet   Oral   Take 1 tablet (10 mg total) by mouth daily.   30 tablet   0   . buPROPion (WELLBUTRIN XL) 150 MG 24 hr tablet   Oral   Take 1 tablet (150 mg total) by mouth daily.   30 tablet   0   . cyclobenzaprine (FLEXERIL) 10 MG tablet   Oral   Take 1 tablet (10 mg total) by mouth 3 (three) times daily as needed for muscle spasms.   30 tablet   0   . dicyclomine (BENTYL) 10 MG capsule   Oral   Take 1 capsule (10 mg total) by mouth 3 (three) times daily as needed (  for IBS symptoms, usually takes at betime).         . hydrOXYzine (ATARAX/VISTARIL) 50 MG tablet   Oral   Take 1 tablet (50 mg total) by mouth at bedtime as needed (sleep).   30 tablet   0   . ibuprofen (ADVIL,MOTRIN) 200 MG tablet   Oral   Take 200 mg by mouth every 6 (six) hours as needed for pain.         . metoprolol succinate (TOPROL-XL) 25 MG 24 hr tablet   Oral   Take 1 tablet (25 mg total) by mouth daily.   30 tablet   0   . omeprazole (PRILOSEC) 40 MG capsule   Oral   Take 1 capsule (40 mg total) by mouth 2 (two) times daily as needed (Upset stomach).         . topiramate (TOPAMAX) 100 MG tablet   Oral   Take 1  tablet (100 mg total) by mouth daily.   30 tablet   0    BP 143/94  Pulse 98  Temp(Src) 98.6 F (37 C) (Oral)  Resp 18  SpO2 100% Physical Exam  Nursing note and vitals reviewed. Constitutional: She is oriented to person, place, and time. Vital signs are normal. She appears well-developed and well-nourished. No distress.  HENT:  Head: Normocephalic and atraumatic.  Pulmonary/Chest: Effort normal. No respiratory distress.  Musculoskeletal:       Thoracic back: She exhibits tenderness and bony tenderness.       Lumbar back: She exhibits tenderness and bony tenderness.  Everywhere I touch on her back is extremely tender and painful. Touching her back lightly in the right CVA area causes pain  to shoot down her right leg.  Neurological: She is alert and oriented to person, place, and time. She has normal strength and normal reflexes. She displays normal reflexes. No cranial nerve deficit. She exhibits normal muscle tone. Coordination normal.  Skin: Skin is warm and dry. No rash noted. She is not diaphoretic.  Psychiatric: She has a normal mood and affect. Judgment normal.    ED Course  Procedures (including critical care time) Labs Review Labs Reviewed  POCT URINALYSIS DIP (DEVICE) - Abnormal; Notable for the following:    Bilirubin Urine SMALL (*)    All other components within normal limits  POCT I-STAT, CHEM 8 - Abnormal; Notable for the following:    Glucose, Bld 103 (*)    Calcium, Ion 1.27 (*)    All other components within normal limits  CBC WITH DIFFERENTIAL  POCT PREGNANCY, URINE   Imaging Review No results found.    MDM   1. Back pain    This patient probably does have fibromyalgia. She needs to followup with a primary care physician. She has G6PD deficiency so we checked a CBC which came back normal. Urinalysis is normal as is i-STAT chem 8. Treating with Flexeril, followup with PCP  Meds ordered this encounter  Medications  . cyclobenzaprine (FLEXERIL) 10  MG tablet    Sig: Take 1 tablet (10 mg total) by mouth 3 (three) times daily as needed for muscle spasms.    Dispense:  30 tablet    Refill:  0    Order Specific Question:  Supervising Provider    Answer:  Bradd Canary D [[id number]]     Graylon Good, PA-C 04/26/13 1345

## 2013-04-26 NOTE — ED Notes (Signed)
Pt     Reports              Symptoms  Of          Back  Pain      With  Headaches           Body  Aches         Headaches   And  sesation of pins  And  Needles        Down  Back  X  sev  Weeks  She  denys  Any injury              Symptoms  unreleives  By otc  meds  And  Home  remidies     Pt  Ambulated  To  Room  With a  Steady  Fluid  Gait  Sitting  Upright on  Exam table  Speaking in  Complete  sentances

## 2013-04-26 NOTE — Discharge Instructions (Signed)
Back Pain, Adult Low back pain is very common. About 1 in 5 people have back pain.The cause of low back pain is rarely dangerous. The pain often gets better over time.About half of people with a sudden onset of back pain feel better in just 2 weeks. About 8 in 10 people feel better by 6 weeks.  CAUSES Some common causes of back pain include:  Strain of the muscles or ligaments supporting the spine.  Wear and tear (degeneration) of the spinal discs.  Arthritis.  Direct injury to the back. DIAGNOSIS Most of the time, the direct cause of low back pain is not known.However, back pain can be treated effectively even when the exact cause of the pain is unknown.Answering your caregiver's questions about your overall health and symptoms is one of the most accurate ways to make sure the cause of your pain is not dangerous. If your caregiver needs more information, he or she may order lab work or imaging tests (X-rays or MRIs).However, even if imaging tests show changes in your back, this usually does not require surgery. HOME CARE INSTRUCTIONS For many people, back pain returns.Since low back pain is rarely dangerous, it is often a condition that people can learn to manageon their own.   Remain active. It is stressful on the back to sit or stand in one place. Do not sit, drive, or stand in one place for more than 30 minutes at a time. Take short walks on level surfaces as soon as pain allows.Try to increase the length of time you walk each day.  Do not stay in bed.Resting more than 1 or 2 days can delay your recovery.  Do not avoid exercise or work.Your body is made to move.It is not dangerous to be active, even though your back may hurt.Your back will likely heal faster if you return to being active before your pain is gone.  Pay attention to your body when you bend and lift. Many people have less discomfortwhen lifting if they bend their knees, keep the load close to their bodies,and  avoid twisting. Often, the most comfortable positions are those that put less stress on your recovering back.  Find a comfortable position to sleep. Use a firm mattress and lie on your side with your knees slightly bent. If you lie on your back, put a pillow under your knees.  Only take over-the-counter or prescription medicines as directed by your caregiver. Over-the-counter medicines to reduce pain and inflammation are often the most helpful.Your caregiver may prescribe muscle relaxant drugs.These medicines help dull your pain so you can more quickly return to your normal activities and healthy exercise.  Put ice on the injured area.  Put ice in a plastic bag.  Place a towel between your skin and the bag.  Leave the ice on for 15-20 minutes, 03-04 times a day for the first 2 to 3 days. After that, ice and heat may be alternated to reduce pain and spasms.  Ask your caregiver about trying back exercises and gentle massage. This may be of some benefit.  Avoid feeling anxious or stressed.Stress increases muscle tension and can worsen back pain.It is important to recognize when you are anxious or stressed and learn ways to manage it.Exercise is a great option. SEEK MEDICAL CARE IF:  You have pain that is not relieved with rest or medicine.  You have pain that does not improve in 1 week.  You have new symptoms.  You are generally not feeling well. SEEK   IMMEDIATE MEDICAL CARE IF:   You have pain that radiates from your back into your legs.  You develop new bowel or bladder control problems.  You have unusual weakness or numbness in your arms or legs.  You develop nausea or vomiting.  You develop abdominal pain.  You feel faint. Document Released: 04/04/2005 Document Revised: 10/04/2011 Document Reviewed: 08/23/2010 [employer] Patient Information 2014 [employer].  

## 2013-04-29 NOTE — ED Provider Notes (Signed)
Medical screening examination/treatment/procedure(s) were performed by resident physician or non-physician practitioner and as supervising physician I was immediately available for consultation/collaboration.   Onofre Gains DOUGLAS MD.   Sheranda Seabrooks D Jaramiah Bossard, MD 04/29/13 1429 

## 2013-05-13 DIAGNOSIS — Z87828 Personal history of other (healed) physical injury and trauma: Secondary | ICD-10-CM | POA: Insufficient documentation

## 2013-05-13 DIAGNOSIS — I10 Essential (primary) hypertension: Secondary | ICD-10-CM | POA: Insufficient documentation

## 2013-05-13 DIAGNOSIS — K929 Disease of digestive system, unspecified: Secondary | ICD-10-CM | POA: Insufficient documentation

## 2013-05-27 DIAGNOSIS — D551 Anemia due to other disorders of glutathione metabolism: Secondary | ICD-10-CM | POA: Insufficient documentation

## 2013-06-24 DIAGNOSIS — G4700 Insomnia, unspecified: Secondary | ICD-10-CM | POA: Insufficient documentation

## 2014-01-30 DIAGNOSIS — R51 Headache: Secondary | ICD-10-CM

## 2014-01-30 DIAGNOSIS — G8929 Other chronic pain: Secondary | ICD-10-CM | POA: Insufficient documentation

## 2014-01-30 DIAGNOSIS — R519 Headache, unspecified: Secondary | ICD-10-CM | POA: Insufficient documentation

## 2014-01-31 DIAGNOSIS — R1084 Generalized abdominal pain: Secondary | ICD-10-CM | POA: Insufficient documentation

## 2014-06-09 DIAGNOSIS — G43009 Migraine without aura, not intractable, without status migrainosus: Secondary | ICD-10-CM | POA: Insufficient documentation

## 2014-07-24 DIAGNOSIS — M2550 Pain in unspecified joint: Secondary | ICD-10-CM | POA: Insufficient documentation

## 2014-07-24 DIAGNOSIS — Z0000 Encounter for general adult medical examination without abnormal findings: Secondary | ICD-10-CM | POA: Insufficient documentation

## 2014-07-25 DIAGNOSIS — M791 Myalgia, unspecified site: Secondary | ICD-10-CM | POA: Insufficient documentation

## 2014-08-06 ENCOUNTER — Encounter (HOSPITAL_COMMUNITY): Payer: Self-pay

## 2014-08-06 ENCOUNTER — Emergency Department (HOSPITAL_COMMUNITY)
Admission: EM | Admit: 2014-08-06 | Discharge: 2014-08-06 | Disposition: A | Discharge status: Home / Self Care | Payer: Medicare Other | Attending: Emergency Medicine | Admitting: Emergency Medicine

## 2014-08-06 DIAGNOSIS — M791 Myalgia, unspecified site: Secondary | ICD-10-CM

## 2014-08-06 DIAGNOSIS — Z8719 Personal history of other diseases of the digestive system: Secondary | ICD-10-CM | POA: Diagnosis not present

## 2014-08-06 DIAGNOSIS — F319 Bipolar disorder, unspecified: Secondary | ICD-10-CM | POA: Diagnosis not present

## 2014-08-06 DIAGNOSIS — G43909 Migraine, unspecified, not intractable, without status migrainosus: Secondary | ICD-10-CM | POA: Diagnosis not present

## 2014-08-06 DIAGNOSIS — Z79899 Other long term (current) drug therapy: Secondary | ICD-10-CM | POA: Insufficient documentation

## 2014-08-06 DIAGNOSIS — Z86711 Personal history of pulmonary embolism: Secondary | ICD-10-CM | POA: Diagnosis not present

## 2014-08-06 DIAGNOSIS — I10 Essential (primary) hypertension: Secondary | ICD-10-CM | POA: Diagnosis not present

## 2014-08-06 DIAGNOSIS — Z86718 Personal history of other venous thrombosis and embolism: Secondary | ICD-10-CM | POA: Insufficient documentation

## 2014-08-06 MED ORDER — DICLOFENAC SODIUM 75 MG PO TBEC: 75.0000 mg | DELAYED_RELEASE_TABLET | Freq: Two times a day (BID) | ORAL | Status: DC

## 2014-08-06 MED ORDER — HYDROCODONE-ACETAMINOPHEN 5-325 MG PO TABS
1.0000 | ORAL_TABLET | Freq: Once | ORAL | Status: AC
  Administered 2014-08-06: 1 via ORAL
  Filled 2014-08-06: qty 1

## 2014-08-06 MED ORDER — DICLOFENAC SODIUM 1 % TD GEL: 2.0000 g | Freq: Four times a day (QID) | TRANSDERMAL | Status: DC

## 2014-08-06 NOTE — Discharge Instructions (Signed)
1. Medications: Voltaren gel and tablets, usual home medications 2. Treatment: rest, drink plenty of fluids,  3. Follow Up: Please followup with your primary doctor in 2 days for discussion of your diagnoses and further evaluation after today's visit; if you do not have a primary care doctor use the resource guide provided to find one; Please return to the ER for numbness, weakness, or other concerns   Muscle Pain Muscle pain (myalgia) may be caused by many things, including:  Overuse or muscle strain, especially if you are not in shape. This is the most common cause of muscle pain.  Injury.  Bruises.  Viruses, such as the flu.  Infectious diseases.  Fibromyalgia, which is a chronic condition that causes muscle tenderness, fatigue, and headache.  Autoimmune diseases, including lupus.  Certain drugs, including ACE inhibitors and statins. Muscle pain may be mild or severe. In most cases, the pain lasts only a short time and goes away without treatment. To diagnose the cause of your muscle pain, your health care provider will take your medical history. This means he or she will ask you when your muscle pain began and what has been happening. If you have not had muscle pain for very long, your health care provider may want to wait before doing much testing. If your muscle pain has lasted a long time, your health care provider may want to run tests right away. If your health care provider thinks your muscle pain may be caused by illness, you may need to have additional tests to rule out certain conditions.  Treatment for muscle pain depends on the cause. Home care is often enough to relieve muscle pain. Your health care provider may also prescribe anti-inflammatory medicine. HOME CARE INSTRUCTIONS Watch your condition for any changes. The following actions may help to lessen any discomfort you are feeling:  Only take over-the-counter or prescription medicines as directed by your health care  provider.  Apply ice to the sore muscle:  Put ice in a plastic bag.  Place a towel between your skin and the bag.  Leave the ice on for 15-20 minutes, 3-4 times a day.  You may alternate applying hot and cold packs to the muscle as directed by your health care provider.  If overuse is causing your muscle pain, slow down your activities until the pain goes away.  Remember that it is normal to feel some muscle pain after starting a workout program. Muscles that have not been used often will be sore at first.  Do regular, gentle exercises if you are not usually active.  Warm up before exercising to lower your risk of muscle pain.  Do not continue working out if the pain is very bad. Bad pain could mean you have injured a muscle. SEEK MEDICAL CARE IF:  Your muscle pain gets worse, and medicines do not help.  You have muscle pain that lasts longer than 3 days.  You have a rash or fever along with muscle pain.  You have muscle pain after a tick bite.  You have muscle pain while working out, even though you are in good physical condition.  You have redness, soreness, or swelling along with muscle pain.  You have muscle pain after starting a new medicine or changing the dose of a medicine. SEEK IMMEDIATE MEDICAL CARE IF:  You have trouble breathing.  You have trouble swallowing.  You have muscle pain along with a stiff neck, fever, and vomiting.  You have severe muscle weakness or  cannot move part of your body. MAKE SURE YOU:   Understand these instructions.  Will watch your condition.  Will get help right away if you are not doing well or get worse. Document Released: 02/24/2006 Document Revised: 04/09/2013 Document Reviewed: 01/29/2013 [facility] Patient Information 2015 [place]. This information is not intended to replace advice given to you by your health care provider. Make sure you discuss any questions you have with your health care provider.

## 2014-08-06 NOTE — ED Provider Notes (Signed)
CSN: [id number]     Arrival date & time 08/06/14  1352 History  This chart is scribed for non-physician practitioner, Dierdre Forth, PA-C, working with Karen Hong, MD by Abel Presto, ED Scribe.  This patient was seen in room TR04C/TR04C and the patient's care was started 5:47 PM.       No chief complaint on file.   The history is provided by the patient and medical records. No language interpreter was used.   HPI Comments: Karen [name] is a 44 y.o. female with PMHx of HTN, migraine, bipolar affective disorder, PTSD, PE, DVT, and bowel obstruction who presents to the Emergency Department complaining of constant generalized myalgias, especially to neck, back, and bilateral LEs with onset in February. Pt states pain started after botox injection in forehead and neck to treat her migraines. Pt states following the injection she had a rash which has resolved and neck pain which is persistent. She states neck pain has radiated to the rest of her body and into her head and face since that time. She reports associated chills and intermittent bilateral leg swelling. She denies paresthesias.  Pt states she has difficulty sleeping due to pain.  Pt was seen by her PCP a week ago, with blood work testing for arthritis and myositis and no significant results. Pt was given Rx for Zanaflex. Pt was then seen by neurosurgeon and states dosage was increased. She has taken ibuprofen and the muscle relaxer with no relief.  Pt is allergic to aspirin and morphine. Pt denies known injury, fever, vomiting, diarrhea, weakness, dizziness, syncope, loss of bowel or bladder control.  Patient denies recent tick bite or fevers.    Past Medical History  Diagnosis Date  . Hypertension   . Migraine   . IBS (irritable bowel syndrome)   . Bipolar affective disorder   . PTSD (post-traumatic stress disorder)   . Pulmonary embolism   . DVT (deep venous thrombosis)   . Bowel obstruction    Past Surgical History   Procedure Laterality Date  . Abdominal surgery    . Abdominal hysterectomy    . Knee arthroscopy    . Appendectomy    . Cholecystectomy     History reviewed. No pertinent family history. History  Substance Use Topics  . Smoking status: Never Smoker   . Smokeless tobacco: Not on file  . Alcohol Use: Yes     Comment: drinks 1-2 times monthly   OB History    No data available     Review of Systems  Constitutional: Positive for chills. Negative for fever, diaphoresis, appetite change, fatigue and unexpected weight change.  HENT: Negative for mouth sores.   Eyes: Negative for visual disturbance.  Respiratory: Negative for cough, chest tightness, shortness of breath and wheezing.   Cardiovascular: Negative for chest pain.  Gastrointestinal: Negative for nausea, vomiting, abdominal pain, diarrhea and constipation.  Endocrine: Negative for polydipsia, polyphagia and polyuria.  Genitourinary: Negative for dysuria, urgency, frequency and hematuria.  Musculoskeletal: Positive for myalgias and joint swelling. Negative for back pain and neck stiffness.  Skin: Negative for rash.  Allergic/Immunologic: Negative for immunocompromised state.  Neurological: Negative for syncope, light-headedness and headaches.  Hematological: Does not bruise/bleed easily.  Psychiatric/Behavioral: Negative for sleep disturbance. The patient is not nervous/anxious.       Allergies  Aspirin; Benadryl; Ciprofloxacin hcl; Morphine and related; and Sulfa antibiotics  Home Medications   Prior to Admission medications   Medication Sig Start Date End Date Taking? Authorizing Provider  ARIPiprazole (ABILIFY) 10 MG tablet Take 1 tablet (10 mg total) by mouth daily. 12/10/12  Yes Thermon LeylandLaura A Davis, NP  buPROPion (WELLBUTRIN XL) 150 MG 24 hr tablet Take 1 tablet (150 mg total) by mouth daily. 12/10/12  Yes Thermon LeylandLaura A Davis, NP  hydrOXYzine (ATARAX/VISTARIL) 50 MG tablet Take 1 tablet (50 mg total) by mouth at bedtime as  needed (sleep). 12/10/12  Yes Thermon LeylandLaura A Davis, NP  ibuprofen (ADVIL,MOTRIN) 200 MG tablet Take 200 mg by mouth every 6 (six) hours as needed for pain.   Yes Historical Provider, MD  metoprolol succinate (TOPROL-XL) 25 MG 24 hr tablet Take 1 tablet (25 mg total) by mouth daily. 12/10/12  Yes Thermon LeylandLaura A Davis, NP  omeprazole (PRILOSEC) 40 MG capsule Take 1 capsule (40 mg total) by mouth 2 (two) times daily as needed (Upset stomach). 12/10/12  Yes Thermon LeylandLaura A Davis, NP  topiramate (TOPAMAX) 100 MG tablet Take 1 tablet (100 mg total) by mouth daily. 12/10/12  Yes Thermon LeylandLaura A Davis, NP  cyclobenzaprine (FLEXERIL) 10 MG tablet Take 1 tablet (10 mg total) by mouth 3 (three) times daily as needed for muscle spasms. Patient not taking: Reported on 08/06/2014 04/26/13   Graylon GoodZachary H Baker, PA-C  diclofenac (VOLTAREN) 75 MG EC tablet Take 1 tablet (75 mg total) by mouth 2 (two) times daily. 08/06/14   Dorris Vangorder, PA-C  diclofenac sodium (VOLTAREN) 1 % GEL Apply 2 g topically 4 (four) times daily. 08/06/14   Reyden Smith, PA-C  dicyclomine (BENTYL) 10 MG capsule Take 1 capsule (10 mg total) by mouth 3 (three) times daily as needed (for IBS symptoms, usually takes at betime). Patient not taking: Reported on 08/06/2014 12/10/12   Thermon LeylandLaura A Davis, NP   BP 116/81 mmHg  Pulse 83  Temp(Src) 97.9 F (36.6 C) (Oral)  Resp 18  Ht 5\' 4"  (1.626 m)  Wt 167 lb (75.751 kg)  BMI 28.65 kg/m2  SpO2 100% Physical Exam  Constitutional: She appears well-developed and well-nourished. No distress.  Awake, alert, nontoxic appearance  HENT:  Head: Normocephalic and atraumatic.  Mouth/Throat: Oropharynx is clear and moist. No oropharyngeal exudate.  Eyes: Conjunctivae are normal. No scleral icterus.  Neck: Normal range of motion. Neck supple.  Cardiovascular: Normal rate, regular rhythm, normal heart sounds and intact distal pulses.   No murmur heard. Pulmonary/Chest: Effort normal and breath sounds normal. No respiratory distress.  She has no wheezes.  Equal chest expansion Clear and equal breath sounds  Abdominal: Soft. Bowel sounds are normal. She exhibits no mass. There is no tenderness. There is no rebound and no guarding.  Musculoskeletal: Normal range of motion. She exhibits no edema.  All major joints inspected without erythema, increased warmth or swelling  Neurological: She is alert.  Mental Status:  Alert, oriented, thought content appropriate, able to give a coherent history. Speech fluent without evidence of aphasia. Able to follow 2 step commands without difficulty.  Cranial Nerves:  II:  Peripheral visual fields grossly normal, pupils equal, round, reactive to light III,IV, VI: ptosis not present, extra-ocular motions intact bilaterally  V,VII: smile symmetric, facial light touch sensation equal VIII: hearing grossly normal to voice  X: uvula elevates symmetrically  XI: bilateral shoulder shrug symmetric and strong XII: midline tongue extension without fassiculations Motor:  Normal tone. 5/5 in upper and lower extremities bilaterally including strong and equal grip strength and dorsiflexion/plantar flexion Sensory: Pinprick and light touch normal in all extremities.  Deep Tendon Reflexes: 2+ and symmetric in the biceps  and patella Cerebellar: normal finger-to-nose with bilateral upper extremities Gait: normal gait and balance CV: distal pulses palpable throughout  No Clonus  Skin: Skin is warm and dry. She is not diaphoretic.  No rash including no petechiae, purpura or target lesions  Psychiatric: She has a normal mood and affect.  Nursing note and vitals reviewed.   ED Course  Procedures (including critical care time) DIAGNOSTIC STUDIES: Oxygen Saturation is 99% on room air, normal by my interpretation.    COORDINATION OF CARE: 5:57 PM Discussed treatment plan with patient at beside, the patient agrees with the plan and has no further questions at this time.   Labs Review Labs Reviewed -  No data to display  Imaging Review No results found.   EKG Interpretation None      MDM   Final diagnoses:  Myalgia   Arbie Hubbert resents with entire body pain since February 2016.  Patient is well appearing. She is tenderness to palpation over her muscles and joints. She has no erythema, swelling decreased range of motion of her joints. No risk factors for take bites. No rashes. Patient with normal neurologic exam and ambulates without difficulty.  She denies paresthesias. Patient is without clonus and extends into her head, doubt transverse myelitis. Patient's pain appears to be chronic. She is already taking muscle relaxers and anti-inflammatories.  I have offered diclofenac cream for her joints.  Rolly Salter) primary care follow-up. This time it do not believe treating her pain with narcotics is safe. I have discussed this with the patient and she is in agreement with the plan.  BP 116/81 mmHg  Pulse 83  Temp(Src) 97.9 F (36.6 C) (Oral)  Resp 18  Ht  (1.626 m)  Wt 167 lb (75.751 kg)  BMI 28.65 kg/m2  SpO2 100%  I personally performed the services described in this documentation, which was scribed in my presence. The recorded information has been reviewed and is accurate.    Dahlia Client Vonceil Upshur, PA-C 08/06/14 2219  Karen Hong, MD 08/07/14 [phone]

## 2014-08-06 NOTE — ED Notes (Signed)
The pa saw before myself.  See her niotes

## 2014-08-06 NOTE — ED Notes (Signed)
Pt presents with 1 month of generalized pain.  Pt reports receiving botox injections to head and neck for migraines with onset of pain.  Pt seen at PCP, given muscle relaxers which do not help.

## 2014-08-19 DIAGNOSIS — R202 Paresthesia of skin: Secondary | ICD-10-CM | POA: Insufficient documentation

## 2014-08-19 DIAGNOSIS — M6281 Muscle weakness (generalized): Secondary | ICD-10-CM | POA: Insufficient documentation

## 2014-08-19 DIAGNOSIS — R29898 Other symptoms and signs involving the musculoskeletal system: Secondary | ICD-10-CM | POA: Insufficient documentation

## 2014-08-19 DIAGNOSIS — M5412 Radiculopathy, cervical region: Secondary | ICD-10-CM | POA: Insufficient documentation

## 2014-08-26 ENCOUNTER — Other Ambulatory Visit: Payer: Self-pay | Admitting: Family Medicine

## 2014-08-26 DIAGNOSIS — R200 Anesthesia of skin: Secondary | ICD-10-CM

## 2014-08-26 DIAGNOSIS — R202 Paresthesia of skin: Secondary | ICD-10-CM

## 2014-08-26 DIAGNOSIS — M5412 Radiculopathy, cervical region: Secondary | ICD-10-CM

## 2014-09-01 ENCOUNTER — Ambulatory Visit
Admission: RE | Admit: 2014-09-01 | Discharge: 2014-09-01 | Disposition: A | Discharge status: Home / Self Care | Payer: Medicare Other | Source: Ambulatory Visit | Attending: Family Medicine | Admitting: Family Medicine

## 2014-09-01 DIAGNOSIS — R200 Anesthesia of skin: Secondary | ICD-10-CM

## 2014-09-01 DIAGNOSIS — R202 Paresthesia of skin: Secondary | ICD-10-CM

## 2014-09-01 DIAGNOSIS — M5412 Radiculopathy, cervical region: Secondary | ICD-10-CM

## 2014-12-08 DIAGNOSIS — G4733 Obstructive sleep apnea (adult) (pediatric): Secondary | ICD-10-CM | POA: Insufficient documentation

## 2015-01-23 ENCOUNTER — Other Ambulatory Visit: Payer: Self-pay | Admitting: Neurological Surgery

## 2015-01-23 DIAGNOSIS — M5412 Radiculopathy, cervical region: Secondary | ICD-10-CM

## 2015-02-04 ENCOUNTER — Ambulatory Visit
Admission: RE | Admit: 2015-02-04 | Discharge: 2015-02-04 | Disposition: A | Discharge status: Home / Self Care | Payer: Medicare Other | Source: Ambulatory Visit | Attending: Neurological Surgery | Admitting: Neurological Surgery

## 2015-02-04 VITALS — BP 121/83 | HR 72

## 2015-02-04 DIAGNOSIS — R29898 Other symptoms and signs involving the musculoskeletal system: Secondary | ICD-10-CM

## 2015-02-04 DIAGNOSIS — M5412 Radiculopathy, cervical region: Secondary | ICD-10-CM

## 2015-02-04 MED ORDER — IOHEXOL 300 MG/ML  SOLN
10.0000 mL | Freq: Once | INTRAMUSCULAR | Status: DC | PRN
  Administered 2015-02-04: 10 mL via INTRATHECAL

## 2015-02-04 MED ORDER — MEPERIDINE HCL 100 MG/ML IJ SOLN
75.0000 mg | Freq: Once | INTRAMUSCULAR | Status: AC
  Administered 2015-02-04: 75 mg via INTRAMUSCULAR

## 2015-02-04 MED ORDER — ONDANSETRON HCL 4 MG/2ML IJ SOLN
4.0000 mg | Freq: Once | INTRAMUSCULAR | Status: AC
  Administered 2015-02-04: 4 mg via INTRAMUSCULAR

## 2015-02-04 MED ORDER — DIAZEPAM 5 MG PO TABS
10.0000 mg | ORAL_TABLET | Freq: Once | ORAL | Status: AC
  Administered 2015-02-04: 10 mg via ORAL

## 2015-02-04 NOTE — Discharge Instructions (Signed)
Myelogram Discharge Instructions  1. Go home and rest quietly for the next 24 hours.  It is important to lie flat for the next 24 hours.  Get up only to go to the restroom.  You may lie in the bed or on a couch on your back, your stomach, your left side or your right side.  You may have one pillow under your head.  You may have pillows between your knees while you are on your side or under your knees while you are on your back.  2. DO NOT drive today.  Recline the seat as far back as it will go, while still wearing your seat belt, on the way home.  3. You may get up to go to the bathroom as needed.  You may sit up for 10 minutes to eat.  You may resume your normal diet and medications unless otherwise indicated.  Drink plenty of extra fluids today and tomorrow.  4. The incidence of a spinal headache with nausea and/or vomiting is about 5% (one in 20 patients).  If you develop a headache, lie flat and drink plenty of fluids until the headache goes away.  Caffeinated beverages may be helpful.  If you develop severe nausea and vomiting or a headache that does not go away with flat bed rest, call [phone].  5. You may resume normal activities after your 24 hours of bed rest is over; however, do not exert yourself strongly or do any heavy lifting tomorrow.  6. Call your physician for a follow-up appointment.   You may resume Buspar, Cymbalta, Latuda and Promethazine on Thursday, February 05, 2015 after 9:30a.m.

## 2015-02-19 ENCOUNTER — Other Ambulatory Visit (HOSPITAL_COMMUNITY): Payer: Self-pay | Admitting: Neurological Surgery

## 2015-02-24 ENCOUNTER — Encounter (HOSPITAL_COMMUNITY): Payer: Self-pay

## 2015-02-24 ENCOUNTER — Ambulatory Visit (HOSPITAL_COMMUNITY)
Admission: RE | Admit: 2015-02-24 | Discharge: 2015-02-24 | Disposition: A | Discharge status: Home / Self Care | Payer: Medicare Other | Source: Ambulatory Visit | Attending: Neurological Surgery | Admitting: Neurological Surgery

## 2015-02-24 ENCOUNTER — Encounter (HOSPITAL_COMMUNITY)
Admission: RE | Admit: 2015-02-24 | Discharge: 2015-02-24 | Disposition: A | Discharge status: Home / Self Care | Payer: Medicare Other | Source: Ambulatory Visit | Attending: Neurological Surgery | Admitting: Neurological Surgery

## 2015-02-24 DIAGNOSIS — M2550 Pain in unspecified joint: Secondary | ICD-10-CM | POA: Insufficient documentation

## 2015-02-24 DIAGNOSIS — R51 Headache: Secondary | ICD-10-CM | POA: Diagnosis not present

## 2015-02-24 DIAGNOSIS — G4733 Obstructive sleep apnea (adult) (pediatric): Secondary | ICD-10-CM | POA: Insufficient documentation

## 2015-02-24 DIAGNOSIS — F319 Bipolar disorder, unspecified: Secondary | ICD-10-CM | POA: Insufficient documentation

## 2015-02-24 DIAGNOSIS — Z01812 Encounter for preprocedural laboratory examination: Secondary | ICD-10-CM | POA: Insufficient documentation

## 2015-02-24 DIAGNOSIS — M4802 Spinal stenosis, cervical region: Secondary | ICD-10-CM

## 2015-02-24 DIAGNOSIS — I10 Essential (primary) hypertension: Secondary | ICD-10-CM | POA: Insufficient documentation

## 2015-02-24 DIAGNOSIS — Z87891 Personal history of nicotine dependence: Secondary | ICD-10-CM | POA: Insufficient documentation

## 2015-02-24 DIAGNOSIS — D6489 Other specified anemias: Secondary | ICD-10-CM | POA: Diagnosis not present

## 2015-02-24 DIAGNOSIS — F3130 Bipolar disorder, current episode depressed, mild or moderate severity, unspecified: Secondary | ICD-10-CM | POA: Diagnosis not present

## 2015-02-24 DIAGNOSIS — G43009 Migraine without aura, not intractable, without status migrainosus: Secondary | ICD-10-CM | POA: Insufficient documentation

## 2015-02-24 DIAGNOSIS — Z01810 Encounter for preprocedural cardiovascular examination: Secondary | ICD-10-CM | POA: Diagnosis not present

## 2015-02-24 DIAGNOSIS — F4310 Post-traumatic stress disorder, unspecified: Secondary | ICD-10-CM | POA: Insufficient documentation

## 2015-02-24 DIAGNOSIS — D551 Anemia due to other disorders of glutathione metabolism: Secondary | ICD-10-CM | POA: Insufficient documentation

## 2015-02-24 DIAGNOSIS — M5412 Radiculopathy, cervical region: Secondary | ICD-10-CM | POA: Diagnosis not present

## 2015-02-24 DIAGNOSIS — Z01818 Encounter for other preprocedural examination: Secondary | ICD-10-CM | POA: Diagnosis present

## 2015-02-24 HISTORY — DX: Gastro-esophageal reflux disease without esophagitis: K21.9

## 2015-02-24 HISTORY — DX: Unspecified osteoarthritis, unspecified site: M19.90

## 2015-02-24 HISTORY — DX: Anxiety disorder, unspecified: F41.9

## 2015-02-24 HISTORY — DX: Anemia, unspecified: D64.9

## 2015-02-24 HISTORY — DX: Major depressive disorder, single episode, unspecified: F32.9

## 2015-02-24 HISTORY — DX: Depression, unspecified: F32.A

## 2015-02-24 LAB — CBC WITH DIFFERENTIAL/PLATELET
BASOS ABS: 0 10*3/uL (ref 0.0–0.1)
Basophils Relative: 0 %
EOS PCT: 0 %
Eosinophils Absolute: 0 10*3/uL (ref 0.0–0.7)
HEMATOCRIT: 37.9 % (ref 36.0–46.0)
Hemoglobin: 12.5 g/dL (ref 12.0–15.0)
LYMPHS PCT: 40 %
Lymphs Abs: 2.5 10*3/uL (ref 0.7–4.0)
MCH: 30.6 pg (ref 26.0–34.0)
MCHC: 33 g/dL (ref 30.0–36.0)
MCV: 92.7 fL (ref 78.0–100.0)
MONO ABS: 0.5 10*3/uL (ref 0.1–1.0)
MONOS PCT: 8 %
NEUTROS ABS: 3.3 10*3/uL (ref 1.7–7.7)
Neutrophils Relative %: 52 %
PLATELETS: 237 10*3/uL (ref 150–400)
RBC: 4.09 MIL/uL (ref 3.87–5.11)
RDW: 12.2 % (ref 11.5–15.5)
WBC: 6.3 10*3/uL (ref 4.0–10.5)

## 2015-02-24 LAB — BASIC METABOLIC PANEL
ANION GAP: 8 (ref 5–15)
BUN: 6 mg/dL (ref 6–20)
CO2: 27 mmol/L (ref 22–32)
Calcium: 9.6 mg/dL (ref 8.9–10.3)
Chloride: 98 mmol/L — ABNORMAL LOW (ref 101–111)
Creatinine, Ser: 0.87 mg/dL (ref 0.44–1.00)
GFR calc Af Amer: >60 mL/min (ref >60)
GLUCOSE: 96 mg/dL (ref 65–99)
POTASSIUM: 3.9 mmol/L (ref 3.5–5.1)
Sodium: 133 mmol/L — ABNORMAL LOW (ref 135–145)

## 2015-02-24 LAB — PROTIME-INR
INR: 1.13 (ref 0.00–1.49)
Prothrombin Time: 14.7 seconds (ref 11.6–15.2)

## 2015-02-24 LAB — SURGICAL PCR SCREEN
MRSA, PCR: NEGATIVE
STAPHYLOCOCCUS AUREUS: NEGATIVE

## 2015-02-24 NOTE — Pre-Procedure Instructions (Signed)
    Karen [name]  02/24/2015      [facility] - [place], [place] - [address] [place] [id number] Phone: [phone] Fax: [phone]    Your procedure is scheduled on 03-04-2015   Wednesday  .  [facility]30 [facility]    Call this number if you have problems the morning of surgery:  [phone]   Remember:  Do not eat food or drink liquids after midnight.   Take these medicines the morning of surgery with A SIP OF WATER Amlodipine(Norvasc),cogentin,Buspirone(Buspar),Cymbalta,Keppra,Omeprazole(Prilosec),pain medications as needed,Tizanidine(Zanaflex.              Stop Aspirin, NSAIDS(ibuprofen,aleve,naproxen,Goody's powders) Herbal supplements,ish oil and vitamins 7 days prior to surgery       Do not wear jewelry, make-up or nail polish.  Do not wear lotions, powders, or perfumes.  You may not wear deodorant.  Do not shave 48 hours prior to surgery.   .  Do not bring valuables to the hospital.  [facility] any belongings or valuables.  Contacts, dentures or bridgework may not be worn into surgery.  Leave your suitcase in the car.  After surgery it may be brought to your room.  For patients admitted to the hospital, discharge time will be determined by your treatment team.  Patients discharged the day of surgery will not be allowed to drive home.   Special instructions:  See attached sheet for instructions on CHG shower    Please read over the following fact sheets that you were given. Pain Booklet and Surgical Site Infection Prevention

## 2015-03-04 ENCOUNTER — Inpatient Hospital Stay (HOSPITAL_COMMUNITY): Payer: Medicare Other

## 2015-03-04 ENCOUNTER — Inpatient Hospital Stay (HOSPITAL_COMMUNITY): Payer: Medicare Other | Admitting: Anesthesiology

## 2015-03-04 ENCOUNTER — Ambulatory Visit (HOSPITAL_COMMUNITY)
Admission: RE | Admit: 2015-03-04 | Discharge: 2015-03-05 | Disposition: A | Discharge status: Home / Self Care | Payer: Medicare Other | Source: Ambulatory Visit | Attending: Neurological Surgery | Admitting: Neurological Surgery

## 2015-03-04 ENCOUNTER — Encounter (HOSPITAL_COMMUNITY)
Admission: RE | Disposition: A | Discharge status: Home / Self Care | Payer: Self-pay | Source: Ambulatory Visit | Attending: Neurological Surgery

## 2015-03-04 ENCOUNTER — Encounter (HOSPITAL_COMMUNITY): Payer: Self-pay | Admitting: Neurological Surgery

## 2015-03-04 DIAGNOSIS — I10 Essential (primary) hypertension: Secondary | ICD-10-CM | POA: Diagnosis not present

## 2015-03-04 DIAGNOSIS — Z981 Arthrodesis status: Secondary | ICD-10-CM

## 2015-03-04 DIAGNOSIS — M4722 Other spondylosis with radiculopathy, cervical region: Secondary | ICD-10-CM | POA: Diagnosis present

## 2015-03-04 DIAGNOSIS — Z419 Encounter for procedure for purposes other than remedying health state, unspecified: Secondary | ICD-10-CM

## 2015-03-04 DIAGNOSIS — Z79891 Long term (current) use of opiate analgesic: Secondary | ICD-10-CM | POA: Insufficient documentation

## 2015-03-04 DIAGNOSIS — Z87891 Personal history of nicotine dependence: Secondary | ICD-10-CM | POA: Insufficient documentation

## 2015-03-04 DIAGNOSIS — M1990 Unspecified osteoarthritis, unspecified site: Secondary | ICD-10-CM | POA: Diagnosis not present

## 2015-03-04 DIAGNOSIS — Z79899 Other long term (current) drug therapy: Secondary | ICD-10-CM | POA: Diagnosis not present

## 2015-03-04 DIAGNOSIS — Z791 Long term (current) use of non-steroidal anti-inflammatories (NSAID): Secondary | ICD-10-CM | POA: Insufficient documentation

## 2015-03-04 DIAGNOSIS — M50122 Cervical disc disorder at C5-C6 level with radiculopathy: Secondary | ICD-10-CM | POA: Insufficient documentation

## 2015-03-04 DIAGNOSIS — Z86718 Personal history of other venous thrombosis and embolism: Secondary | ICD-10-CM | POA: Diagnosis not present

## 2015-03-04 DIAGNOSIS — F319 Bipolar disorder, unspecified: Secondary | ICD-10-CM | POA: Diagnosis not present

## 2015-03-04 DIAGNOSIS — G4730 Sleep apnea, unspecified: Secondary | ICD-10-CM | POA: Diagnosis not present

## 2015-03-04 DIAGNOSIS — K219 Gastro-esophageal reflux disease without esophagitis: Secondary | ICD-10-CM | POA: Insufficient documentation

## 2015-03-04 DIAGNOSIS — Z86711 Personal history of pulmonary embolism: Secondary | ICD-10-CM | POA: Diagnosis not present

## 2015-03-04 DIAGNOSIS — K589 Irritable bowel syndrome without diarrhea: Secondary | ICD-10-CM | POA: Diagnosis not present

## 2015-03-04 HISTORY — PX: ANTERIOR CERVICAL DECOMP/DISCECTOMY FUSION: SHX1161

## 2015-03-04 SURGERY — ANTERIOR CERVICAL DECOMPRESSION/DISCECTOMY FUSION 1 LEVEL
Anesthesia: General

## 2015-03-04 MED ORDER — DULOXETINE HCL 60 MG PO CPEP
60.0000 mg | ORAL_CAPSULE | Freq: Every day | ORAL | Status: DC
  Administered 2015-03-05: 60 mg via ORAL
  Filled 2015-03-04: qty 1

## 2015-03-04 MED ORDER — ROCURONIUM BROMIDE 50 MG/5ML IV SOLN
INTRAVENOUS | Status: AC
  Filled 2015-03-04: qty 1

## 2015-03-04 MED ORDER — LIDOCAINE HCL (CARDIAC) 20 MG/ML IV SOLN
INTRAVENOUS | Status: AC
  Filled 2015-03-04: qty 5

## 2015-03-04 MED ORDER — MEPERIDINE HCL 25 MG/ML IJ SOLN: 6.2500 mg | INTRAMUSCULAR | Status: DC | PRN

## 2015-03-04 MED ORDER — LIDOCAINE HCL (CARDIAC) 20 MG/ML IV SOLN
INTRAVENOUS | Status: DC | PRN
Start: 2015-03-04 — End: 2015-03-04
  Administered 2015-03-04: 40 mg via INTRAVENOUS
  Administered 2015-03-04: 60 mg via INTRATRACHEAL

## 2015-03-04 MED ORDER — FENTANYL CITRATE (PF) 250 MCG/5ML IJ SOLN
INTRAMUSCULAR | Status: AC
  Filled 2015-03-04: qty 5

## 2015-03-04 MED ORDER — SODIUM CHLORIDE 0.9 % IR SOLN
Status: DC | PRN
  Administered 2015-03-04: 15:00:00

## 2015-03-04 MED ORDER — LURASIDONE HCL 40 MG PO TABS: 60.0000 mg | ORAL_TABLET | Freq: Every evening | ORAL | Status: DC

## 2015-03-04 MED ORDER — HYDROMORPHONE HCL 1 MG/ML IJ SOLN
0.5000 mg | INTRAMUSCULAR | Status: DC | PRN
  Administered 2015-03-04: 0.5 mg via INTRAVENOUS
  Administered 2015-03-05 (×2): 1 mg via INTRAVENOUS
  Filled 2015-03-04 (×3): qty 1

## 2015-03-04 MED ORDER — CEFAZOLIN SODIUM 1-5 GM-% IV SOLN
1.0000 g | Freq: Three times a day (TID) | INTRAVENOUS | Status: AC
  Administered 2015-03-04 – 2015-03-05 (×2): 1 g via INTRAVENOUS
  Filled 2015-03-04 (×2): qty 50

## 2015-03-04 MED ORDER — OXYCODONE-ACETAMINOPHEN 5-325 MG PO TABS
ORAL_TABLET | ORAL | Status: AC
  Filled 2015-03-04: qty 2

## 2015-03-04 MED ORDER — HYDROMORPHONE HCL 1 MG/ML IJ SOLN
INTRAMUSCULAR | Status: AC
  Administered 2015-03-04: 0.5 mg via INTRAVENOUS
  Filled 2015-03-04: qty 1

## 2015-03-04 MED ORDER — ONDANSETRON HCL 4 MG/2ML IJ SOLN
INTRAMUSCULAR | Status: AC
  Filled 2015-03-04: qty 2

## 2015-03-04 MED ORDER — PROPOFOL 10 MG/ML IV BOLUS
INTRAVENOUS | Status: AC
  Filled 2015-03-04: qty 20

## 2015-03-04 MED ORDER — PHENOL 1.4 % MT LIQD
1.0000 | OROMUCOSAL | Status: DC | PRN
Start: 2015-03-04 — End: 2015-03-05

## 2015-03-04 MED ORDER — LEVETIRACETAM 750 MG PO TABS
750.0000 mg | ORAL_TABLET | Freq: Every day | ORAL | Status: DC
Start: 2015-03-04 — End: 2015-03-05
  Administered 2015-03-04 – 2015-03-05 (×2): 750 mg via ORAL
  Filled 2015-03-04 (×2): qty 1

## 2015-03-04 MED ORDER — LACTATED RINGERS IV SOLN
INTRAVENOUS | Status: DC
  Administered 2015-03-04 (×2): via INTRAVENOUS

## 2015-03-04 MED ORDER — AMLODIPINE BESYLATE 2.5 MG PO TABS
2.5000 mg | ORAL_TABLET | Freq: Every day | ORAL | Status: DC
  Administered 2015-03-05: 2.5 mg via ORAL
  Filled 2015-03-04: qty 1

## 2015-03-04 MED ORDER — SENNA 8.6 MG PO TABS
1.0000 | ORAL_TABLET | Freq: Two times a day (BID) | ORAL | Status: DC
Start: 2015-03-04 — End: 2015-03-05
  Administered 2015-03-04 – 2015-03-05 (×2): 8.6 mg via ORAL
  Filled 2015-03-04 (×2): qty 1

## 2015-03-04 MED ORDER — DEXAMETHASONE SODIUM PHOSPHATE 4 MG/ML IJ SOLN
INTRAMUSCULAR | Status: AC
  Filled 2015-03-04: qty 1

## 2015-03-04 MED ORDER — NEOSTIGMINE METHYLSULFATE 10 MG/10ML IV SOLN
INTRAVENOUS | Status: AC
  Filled 2015-03-04: qty 1

## 2015-03-04 MED ORDER — POTASSIUM CHLORIDE IN NACL 20-0.9 MEQ/L-% IV SOLN
INTRAVENOUS | Status: DC
  Administered 2015-03-04: 19:00:00 via INTRAVENOUS
  Filled 2015-03-04: qty 1000

## 2015-03-04 MED ORDER — GLYCOPYRROLATE 0.2 MG/ML IJ SOLN
INTRAMUSCULAR | Status: AC
  Filled 2015-03-04: qty 2

## 2015-03-04 MED ORDER — NEOSTIGMINE METHYLSULFATE 10 MG/10ML IV SOLN
INTRAVENOUS | Status: DC | PRN
  Administered 2015-03-04: 3 mg via INTRAVENOUS

## 2015-03-04 MED ORDER — SODIUM CHLORIDE 0.9 % IV SOLN: 250.0000 mL | INTRAVENOUS | Status: DC

## 2015-03-04 MED ORDER — THROMBIN 5000 UNITS EX SOLR
OROMUCOSAL | Status: DC | PRN
  Administered 2015-03-04: 15:00:00 via TOPICAL

## 2015-03-04 MED ORDER — HYDROMORPHONE HCL 1 MG/ML IJ SOLN
0.2500 mg | INTRAMUSCULAR | Status: DC | PRN
  Administered 2015-03-04 (×4): 0.5 mg via INTRAVENOUS

## 2015-03-04 MED ORDER — MIDAZOLAM HCL 5 MG/5ML IJ SOLN
INTRAMUSCULAR | Status: DC | PRN
  Administered 2015-03-04: 2 mg via INTRAVENOUS

## 2015-03-04 MED ORDER — DEXAMETHASONE SODIUM PHOSPHATE 10 MG/ML IJ SOLN
INTRAMUSCULAR | Status: AC
  Filled 2015-03-04: qty 1

## 2015-03-04 MED ORDER — BENZTROPINE MESYLATE 0.5 MG PO TABS
0.5000 mg | ORAL_TABLET | Freq: Every day | ORAL | Status: DC
  Administered 2015-03-04 – 2015-03-05 (×2): 0.5 mg via ORAL
  Filled 2015-03-04 (×2): qty 1

## 2015-03-04 MED ORDER — FENTANYL CITRATE (PF) 100 MCG/2ML IJ SOLN
INTRAMUSCULAR | Status: DC | PRN
  Administered 2015-03-04: 50 ug via INTRAVENOUS
  Administered 2015-03-04: 100 ug via INTRAVENOUS
  Administered 2015-03-04 (×2): 50 ug via INTRAVENOUS

## 2015-03-04 MED ORDER — ARTIFICIAL TEARS OP OINT
TOPICAL_OINTMENT | OPHTHALMIC | Status: AC
  Filled 2015-03-04: qty 3.5

## 2015-03-04 MED ORDER — MIDAZOLAM HCL 2 MG/2ML IJ SOLN
INTRAMUSCULAR | Status: AC
  Filled 2015-03-04: qty 2

## 2015-03-04 MED ORDER — MENTHOL 3 MG MT LOZG: 1.0000 | LOZENGE | OROMUCOSAL | Status: DC | PRN

## 2015-03-04 MED ORDER — ACETAMINOPHEN 650 MG RE SUPP: 650.0000 mg | RECTAL | Status: DC | PRN

## 2015-03-04 MED ORDER — ROCURONIUM BROMIDE 100 MG/10ML IV SOLN
INTRAVENOUS | Status: DC | PRN
  Administered 2015-03-04: 40 mg via INTRAVENOUS

## 2015-03-04 MED ORDER — 0.9 % SODIUM CHLORIDE (POUR BTL) OPTIME
TOPICAL | Status: DC | PRN
  Administered 2015-03-04: 1000 mL

## 2015-03-04 MED ORDER — BUSPIRONE HCL 10 MG PO TABS
10.0000 mg | ORAL_TABLET | Freq: Two times a day (BID) | ORAL | Status: DC
  Administered 2015-03-04 – 2015-03-05 (×2): 10 mg via ORAL
  Filled 2015-03-04 (×2): qty 1

## 2015-03-04 MED ORDER — LURASIDONE HCL 60 MG PO TABS: 1.0000 | ORAL_TABLET | Freq: Every evening | ORAL | Status: DC

## 2015-03-04 MED ORDER — THROMBIN 5000 UNITS EX SOLR
CUTANEOUS | Status: DC | PRN
  Administered 2015-03-04: 5000 [IU] via TOPICAL

## 2015-03-04 MED ORDER — SODIUM CHLORIDE 0.9 % IJ SOLN: 3.0000 mL | INTRAMUSCULAR | Status: DC | PRN

## 2015-03-04 MED ORDER — ONDANSETRON HCL 4 MG/2ML IJ SOLN: 4.0000 mg | INTRAMUSCULAR | Status: DC | PRN

## 2015-03-04 MED ORDER — OXYCODONE-ACETAMINOPHEN 5-325 MG PO TABS
1.0000 | ORAL_TABLET | ORAL | Status: DC | PRN
  Administered 2015-03-04: 1 via ORAL
  Administered 2015-03-04 – 2015-03-05 (×4): 2 via ORAL
  Filled 2015-03-04 (×3): qty 2
  Filled 2015-03-04: qty 1

## 2015-03-04 MED ORDER — SODIUM CHLORIDE 0.9 % IJ SOLN: 3.0000 mL | Freq: Two times a day (BID) | INTRAMUSCULAR | Status: DC

## 2015-03-04 MED ORDER — HEMOSTATIC AGENTS (NO CHARGE) OPTIME
TOPICAL | Status: DC | PRN
  Administered 2015-03-04: 1 via TOPICAL

## 2015-03-04 MED ORDER — PROMETHAZINE HCL 25 MG/ML IJ SOLN: 6.2500 mg | INTRAMUSCULAR | Status: DC | PRN

## 2015-03-04 MED ORDER — TIZANIDINE HCL 4 MG PO TABS: 4.0000 mg | ORAL_TABLET | Freq: Three times a day (TID) | ORAL | Status: DC | PRN

## 2015-03-04 MED ORDER — CEFAZOLIN SODIUM-DEXTROSE 2-3 GM-% IV SOLR
2.0000 g | INTRAVENOUS | Status: AC
  Administered 2015-03-04: 2 g via INTRAVENOUS

## 2015-03-04 MED ORDER — PROPOFOL 10 MG/ML IV BOLUS
INTRAVENOUS | Status: DC | PRN
  Administered 2015-03-04: 160 mg via INTRAVENOUS

## 2015-03-04 MED ORDER — ONDANSETRON HCL 4 MG/2ML IJ SOLN
INTRAMUSCULAR | Status: DC | PRN
  Administered 2015-03-04: 4 mg via INTRAVENOUS

## 2015-03-04 MED ORDER — ACETAMINOPHEN 325 MG PO TABS: 650.0000 mg | ORAL_TABLET | ORAL | Status: DC | PRN

## 2015-03-04 MED ORDER — GLYCOPYRROLATE 0.2 MG/ML IJ SOLN
INTRAMUSCULAR | Status: DC | PRN
Start: 2015-03-04 — End: 2015-03-04
  Administered 2015-03-04: 0.4 mg via INTRAVENOUS

## 2015-03-04 MED ORDER — DEXAMETHASONE SODIUM PHOSPHATE 4 MG/ML IJ SOLN
INTRAMUSCULAR | Status: AC
  Filled 2015-03-04: qty 2

## 2015-03-04 MED ORDER — DEXAMETHASONE SODIUM PHOSPHATE 10 MG/ML IJ SOLN
10.0000 mg | INTRAMUSCULAR | Status: AC
  Administered 2015-03-04: 10 mg via INTRAVENOUS

## 2015-03-04 SURGICAL SUPPLY — 47 items
BAG DECANTER FOR FLEXI CONT (MISCELLANEOUS) ×2 IMPLANT
BENZOIN TINCTURE PRP APPL 2/3 (GAUZE/BANDAGES/DRESSINGS) ×2 IMPLANT
BIT DRILL POWER (BIT) ×1 IMPLANT
BUR MATCHSTICK NEURO 3.0 LAGG (BURR) ×2 IMPLANT
CAGE SMALL 7X13X15 (Cage) ×2 IMPLANT
CANISTER SUCT 3000ML PPV (MISCELLANEOUS) ×2 IMPLANT
DRAPE C-ARM 42X72 X-RAY (DRAPES) ×4 IMPLANT
DRAPE LAPAROTOMY 100X72 PEDS (DRAPES) ×2 IMPLANT
DRAPE MICROSCOPE LEICA (MISCELLANEOUS) ×2 IMPLANT
DRAPE POUCH INSTRU U-SHP 10X18 (DRAPES) ×2 IMPLANT
DRILL BIT POWER (BIT) ×1
DRSG OPSITE POSTOP 3X4 (GAUZE/BANDAGES/DRESSINGS) ×2 IMPLANT
DURAPREP 6ML APPLICATOR 50/CS (WOUND CARE) ×2 IMPLANT
ELECT COATED BLADE 2.86 ST (ELECTRODE) ×2 IMPLANT
ELECT REM PT RETURN 9FT ADLT (ELECTROSURGICAL) ×2
ELECTRODE REM PT RTRN 9FT ADLT (ELECTROSURGICAL) ×1 IMPLANT
GAUZE SPONGE 4X4 16PLY XRAY LF (GAUZE/BANDAGES/DRESSINGS) IMPLANT
GLOVE BIO SURGEON STRL SZ8 (GLOVE) ×4 IMPLANT
GLOVE BIOGEL PI IND STRL 8.5 (GLOVE) ×1 IMPLANT
GLOVE BIOGEL PI INDICATOR 8.5 (GLOVE) ×1
GLOVE INDICATOR 7.0 STRL GRN (GLOVE) ×6 IMPLANT
GLOVE INDICATOR 7.5 STRL GRN (GLOVE) ×2 IMPLANT
GOWN STRL REUS W/ TWL LRG LVL3 (GOWN DISPOSABLE) ×1 IMPLANT
GOWN STRL REUS W/ TWL XL LVL3 (GOWN DISPOSABLE) ×2 IMPLANT
GOWN STRL REUS W/TWL 2XL LVL3 (GOWN DISPOSABLE) ×2 IMPLANT
GOWN STRL REUS W/TWL LRG LVL3 (GOWN DISPOSABLE) ×1
GOWN STRL REUS W/TWL XL LVL3 (GOWN DISPOSABLE) ×2
HEMOSTAT POWDER KIT SURGIFOAM (HEMOSTASIS) ×4 IMPLANT
KIT BASIN OR (CUSTOM PROCEDURE TRAY) ×2 IMPLANT
KIT ROOM TURNOVER OR (KITS) ×2 IMPLANT
NEEDLE HYPO 25X1 1.5 SAFETY (NEEDLE) ×2 IMPLANT
NEEDLE SPNL 20GX3.5 QUINCKE YW (NEEDLE) ×2 IMPLANT
NS IRRIG 1000ML POUR BTL (IV SOLUTION) ×2 IMPLANT
PACK LAMINECTOMY NEURO (CUSTOM PROCEDURE TRAY) ×2 IMPLANT
PAD ARMBOARD 7.5X6 YLW CONV (MISCELLANEOUS) ×2 IMPLANT
PIN DISTRACTION 14MM (PIN) ×4 IMPLANT
PLATE ARCHON 1-LEVEL 22MM (Plate) ×2 IMPLANT
RUBBERBAND STERILE (MISCELLANEOUS) ×4 IMPLANT
SCREW ARCHON SELFTAP 4.0X13 (Screw) ×8 IMPLANT
SPONGE INTESTINAL PEANUT (DISPOSABLE) ×2 IMPLANT
SPONGE SURGIFOAM ABS GEL SZ50 (HEMOSTASIS) ×4 IMPLANT
STRIP CLOSURE SKIN 1/2X4 (GAUZE/BANDAGES/DRESSINGS) ×2 IMPLANT
SUT VIC AB 3-0 SH 8-18 (SUTURE) ×2 IMPLANT
TOWEL OR 17X24 6PK STRL BLUE (TOWEL DISPOSABLE) ×2 IMPLANT
TOWEL OR 17X26 10 PK STRL BLUE (TOWEL DISPOSABLE) ×2 IMPLANT
TRAP SPECIMEN MUCOUS 40CC (MISCELLANEOUS) IMPLANT
WATER STERILE IRR 1000ML POUR (IV SOLUTION) ×2 IMPLANT

## 2015-03-04 NOTE — Transfer of Care (Signed)
Immediate Anesthesia Transfer of Care Note  Patient: Karen [name]  Procedure(s) Performed: Procedure(s): ANTERIOR CERVICAL DECOMPRESSION/DISCECTOMY FUSION CERVICAL SIX-SEVEN (N/A)  Patient Location: PACU  Anesthesia Type:General  Level of Consciousness: awake, alert  and oriented  Airway & Oxygen Therapy: Patient Spontanous Breathing and Patient connected to nasal cannula oxygen  Post-op Assessment: Report given to RN and Post -op Vital signs reviewed and stable  Post vital signs: Reviewed and stable  Last Vitals:  Filed Vitals:   03/04/15 1246  BP: 128/83  Pulse: 83  Temp: 36.8 C  Resp: 20    Complications: No apparent anesthesia complications

## 2015-03-04 NOTE — Op Note (Signed)
03/04/2015  3:58 PM  PATIENT:  Karen [name]  44 y.o. 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 Decompressive anterior cervical discectomy C6-7, 2. Anterior cervical arthrodesis C6-7 utilizing a peek interbody cage packed with morselized autograft, 3. Anterior cervical plating C6-7 utilizing a 24 mm Nuvasive archon plate  SURGEON:  Marikay Alaravid Vilma Will, MD  ASSISTANTS: Dr. Ardyth Manram  ANESTHESIA:   General  EBL:  less than 50cml  Total I/O In: 1000 [I.V.:1000] Out: -   BLOOD ADMINISTERED:none  DRAINS:  None   SPECIMEN:  No Specimen  INDICATION FOR PROCEDURE:  This patient presented with severe right arm pain that was chronic and unresponsive to medical therapy. See chemotherapy allografted under filling the right C7 nerve root. Recommended ACDF with plating. Patient understood the risks, benefits, and alternatives and potential outcomes and wished to proceed.  PROCEDURE DETAILS: Patient was brought to the operating room placed under general endotracheal anesthesia. Patient was placed in the supine position on the operating room table. The neck was prepped with Duraprep and draped in a sterile fashion.   Three cc of local anesthesia was injected and a transverse incision was made on the right side of the neck.  Dissection was carried down thru the subcutaneous tissue and the platysma was  elevated, opened, and undermined with Metzenbaum scissors.  Dissection was then carried out thru an avascular plane leaving the sternocleidomastoid carotid artery and jugular vein laterally and the trachea and esophagus medially. The ventral aspect of the vertebral column was identified and a localizing x-ray was taken. The C6-7 level was identified. The longus colli muscles were then elevated and the retractor was placed. The annulus was incised and the disc space entered. Discectomy  was performed with micro-curettes and pituitary rongeurs. I then used the high-speed drill to drill the endplates down to the level of the posterior longitudinal ligament. The drill shavings were saved in a mucous trap for later arthrodesis. The operating microscope was draped and brought into the field provided additional magnification, illumination and visualization. Discectomy was continued posteriorly thru the disc space. Posterior longitudinal ligament was opened with a nerve hook, and then removed along with disc herniation and osteophytes, decompressing the spinal canal and thecal sac. We then continued to remove osteophytic overgrowth and disc material decompressing the neural foramina and exiting nerve roots bilaterally. The scope was angled up and down to help decompress and undercut the vertebral bodies. Once the decompression was completed we could pass a nerve hook circumferentially to assure adequate decompression in the midline and in the neural foramina. So by both visualization and palpation we felt we had an adequate decompression of the neural elements. We then measured the height of the intravertebral disc space and selected a 7millimeter Peek interbody cage packed with autograft and morcellized allograft. It was then gently positioned in the intravertebral disc space and countersunk. I then used a archon and placed four variable angle screws into the vertebral bodies and locked them into position. The wound was irrigated with bacitracin solution, checked for hemostasis which was established and confirmed. Once meticulous hemostasis was achieved, we then proceeded with closure. The platysma was closed with interrupted 3-0 undyed Vicryl suture, the subcuticular layer was closed with interrupted 3-0 undyed Vicryl suture. The skin edges were approximated with steristrips. The drapes were removed. A sterile dressing was applied. The patient was then awakened from  general anesthesia and transferred to  the recovery room in stable condition. At the end of the procedure all sponge, needle and instrument counts were correct.   PLAN OF CARE: Admit for overnight observation  PATIENT DISPOSITION:  PACU - hemodynamically stable.   Delay start of Pharmacological VTE agent (>24hrs) due to surgical blood loss or risk of bleeding:  yes

## 2015-03-04 NOTE — Anesthesia Postprocedure Evaluation (Signed)
  Anesthesia Post-op Note  Patient: Karen [name]  Procedure(s) Performed: Procedure(s): ANTERIOR CERVICAL DECOMPRESSION/DISCECTOMY FUSION CERVICAL SIX-SEVEN (N/A)  Patient Location: PACU  Anesthesia Type: General   Level of Consciousness: awake, alert  and oriented  Airway and Oxygen Therapy: Patient Spontanous Breathing  Post-op Pain: mild  Post-op Assessment: Post-op Vital signs reviewed  Post-op Vital Signs: Reviewed  Last Vitals:  Filed Vitals:   03/04/15 1700  BP: 127/81  Pulse: 88  Temp: 36.7 C  Resp: 17    Complications: No apparent anesthesia complications

## 2015-03-04 NOTE — Anesthesia Preprocedure Evaluation (Signed)
Anesthesia Evaluation  Patient identified by MRN, date of birth, ID band Patient awake    Reviewed: Allergy & Precautions, NPO status , Patient's Chart, lab work & pertinent test results  Airway Mallampati: II  TM Distance: >3 FB Neck ROM: Full    Dental no notable dental hx.    Pulmonary sleep apnea , former smoker,    Pulmonary exam normal breath sounds clear to auscultation       Cardiovascular hypertension, negative cardio ROS Normal cardiovascular exam Rhythm:Regular Rate:Normal     Neuro/Psych  Headaches, PSYCHIATRIC DISORDERS Anxiety Depression Bipolar Disorder negative psych ROS   GI/Hepatic negative GI ROS, Neg liver ROS,   Endo/Other  negative endocrine ROS  Renal/GU negative Renal ROS     Musculoskeletal  (+) Arthritis ,   Abdominal   Peds  Hematology negative hematology ROS (+) anemia ,   Anesthesia Other Findings   Reproductive/Obstetrics negative OB ROS                             Anesthesia Physical Anesthesia Plan  ASA: II  Anesthesia Plan: General   Post-op Pain Management:    Induction: Intravenous  Airway Management Planned: Oral ETT  Additional Equipment:   Intra-op Plan:   Post-operative Plan: Extubation in OR  Informed Consent: I have reviewed the patients History and Physical, chart, labs and discussed the procedure including the risks, benefits and alternatives for the proposed anesthesia with the patient or authorized representative who has indicated his/her understanding and acceptance.   Dental advisory given  Plan Discussed with: CRNA  Anesthesia Plan Comments:         Anesthesia Quick Evaluation

## 2015-03-04 NOTE — H&P (Signed)
Subjective:   Patient is a 44 y.o. female admitted for neck and arm pain. The patient first presented to me with complaints of neck pain and shooting pains in the arm(s). Onset of symptoms was several months ago. The pain is described as aching and occurs all day. The pain is rated severe, and is located  At the base of the neck and radiates to the arm. The symptoms have been progressive. Symptoms are exacerbated by extending head backwards, and are relieved by none.  Previous work up includes MRI of cervical spine, results: spinal stenosis.  Past Medical History  Diagnosis Date  . Hypertension   . Migraine   . IBS (irritable bowel syndrome)   . Bipolar affective disorder (HCC)   . PTSD (post-traumatic stress disorder)   . Pulmonary embolism (HCC)   . DVT (deep venous thrombosis) (HCC)   . Bowel obstruction (HCC)   . Depression   . Anxiety   . GERD (gastroesophageal reflux disease)   . Arthritis   . Anemia     Past Surgical History  Procedure Laterality Date  . Abdominal surgery    . Abdominal hysterectomy    . Knee arthroscopy    . Appendectomy    . Cholecystectomy    . Tubal ligation      Allergies  Allergen Reactions  . Benadryl [Diphenhydramine Hcl] Hives and Shortness Of Breath  . Buprenorphine Hcl Shortness Of Breath and Rash    Rash and SOB  . Morphine And Related Shortness Of Breath and Rash    Rash and SOB  . Lyrica [Pregabalin] Swelling    Of feet and ankles  . Neurontin [Gabapentin] Other (See Comments)    "made me feel horrible" (was having PEs at the time, so not sure if that contributed)  . Aspirin Other (See Comments)    G6PD deficiency  . Sulfa Antibiotics     G6-PD deficiency  . Ciprofloxacin Hcl Rash    Rash    Social History  Substance Use Topics  . Smoking status: Former Smoker -- 0.50 packs/day for 10 years    Types: Cigarettes    Quit date: 04/19/2011  . Smokeless tobacco: Not on file  . Alcohol Use: Yes     Comment: drinks 1-2 times  monthly    History reviewed. No pertinent family history. Prior to Admission medications   Medication Sig Start Date End Date Taking? Authorizing Provider  amLODipine (NORVASC) 2.5 MG tablet Take 2.5 mg by mouth daily.  07/24/14  Yes Historical Provider, MD  benztropine (COGENTIN) 0.5 MG tablet Take 0.5 mg by mouth daily.   Yes Historical Provider, MD  busPIRone (BUSPAR) 10 MG tablet Take 10 mg by mouth 2 (two) times daily.   Yes Historical Provider, MD  DULoxetine (CYMBALTA) 60 MG capsule Take 60 mg by mouth daily.   Yes Historical Provider, MD  ibuprofen (ADVIL,MOTRIN) 200 MG tablet Take 200 mg by mouth every 6 (six) hours as needed for pain.   Yes Historical Provider, MD  levETIRAcetam (KEPPRA) 750 MG tablet Take 750 mg by mouth daily.  11/17/14 11/17/15 Yes Historical Provider, MD  Lurasidone HCl (LATUDA) 60 MG TABS Take 1 tablet by mouth every evening.   Yes Historical Provider, MD  omeprazole (PRILOSEC) 40 MG capsule Take 1 capsule (40 mg total) by mouth 2 (two) times daily as needed (Upset stomach). 12/10/12  Yes Thermon Leyland, NP  oxyCODONE-acetaminophen (PERCOCET/ROXICET) 5-325 MG tablet Take 1 tablet by mouth every 8 (eight) hours as  needed for severe pain.   Yes Historical Provider, MD  promethazine (PHENERGAN) 25 MG tablet Take 25 mg by mouth every 6 (six) hours as needed for nausea or vomiting.   Yes Historical Provider, MD  tiZANidine (ZANAFLEX) 4 MG tablet Take 4 mg by mouth every 8 (eight) hours as needed.  08/04/14  Yes Historical Provider, MD     Review of Systems  Positive ROS: neg  All other systems have been reviewed and were otherwise negative with the exception of those mentioned in the HPI and as above.  Objective: Vital signs in last 24 hours:    General Appearance: Alert, cooperative, no distress, appears stated age Head: Normocephalic, without obvious abnormality, atraumatic Eyes: PERRL, conjunctiva/corneas clear, EOM's intact      Neck: Supple, symmetrical, trachea  midline, Back: Symmetric, no curvature, ROM normal, no CVA tenderness Lungs:  respirations unlabored Heart: Regular rate and rhythm Abdomen: Soft, non-tender Extremities: Extremities normal, atraumatic, no cyanosis or edema Pulses: 2+ and symmetric all extremities Skin: Skin color, texture, turgor normal, no rashes or lesions  NEUROLOGIC:  Mental status: Alert and oriented x4, no aphasia, good attention span, fund of knowledge and memory  Motor Exam - grossly normal Sensory Exam - grossly normal Reflexes: 1+ Coordination - grossly normal Gait - grossly normal Balance - grossly normal Cranial Nerves: I: smell Not tested  II: visual acuity  OS: nl    OD: nl  II: visual fields Full to confrontation  II: pupils Equal, round, reactive to light  III,VII: ptosis None  III,IV,VI: extraocular muscles  Full ROM  V: mastication Normal  V: facial light touch sensation  Normal  V,VII: corneal reflex  Present  VII: facial muscle function - upper  Normal  VII: facial muscle function - lower Normal  VIII: hearing Not tested  IX: soft palate elevation  Normal  IX,X: gag reflex Present  XI: trapezius strength  5/5  XI: sternocleidomastoid strength 5/5  XI: neck flexion strength  5/5  XII: tongue strength  Normal    Data Review Lab Results  Component Value Date   WBC 6.3 02/24/2015   HGB 12.5 02/24/2015   HCT 37.9 02/24/2015   MCV 92.7 02/24/2015   PLT 237 02/24/2015   Lab Results  Component Value Date   NA 133* 02/24/2015   K 3.9 02/24/2015   CL 98* 02/24/2015   CO2 27 02/24/2015   BUN 6 02/24/2015   CREATININE 0.87 02/24/2015   GLUCOSE 96 02/24/2015   Lab Results  Component Value Date   INR 1.13 02/24/2015    Assessment:   Cervical neck pain with herniated nucleus pulposus/ spondylosis/ stenosis at C5-6. Patient has failed conservative therapy. Planned surgery : ACDF C5-6  Plan:   I explained the condition and procedure to the patient and answered any questions.   Patient wishes to proceed with procedure as planned. Understands risks/ benefits/ and expected or typical outcomes.  JONES,DAVID S 03/04/2015 12:01 PM

## 2015-03-04 NOTE — Anesthesia Procedure Notes (Signed)
Procedure Name: Intubation Date/Time: 03/04/2015 2:46 PM Performed by: [place] Pink Pre-anesthesia Checklist: Patient identified, Emergency Drugs available, Suction available, Patient being monitored and Timeout performed Patient Re-evaluated:Patient Re-evaluated prior to inductionOxygen Delivery Method: Circle system utilized Preoxygenation: Pre-oxygenation with 100% oxygen Intubation Type: IV induction Ventilation: Mask ventilation without difficulty Laryngoscope Size: Mac and 4 Grade View: Grade I Tube type: Oral Tube size: 7.0 mm Number of attempts: 1 Airway Equipment and Method: Stylet and LTA kit utilized Placement Confirmation: ETT inserted through vocal cords under direct vision,  positive ETCO2 and breath sounds checked- equal and bilateral Secured at: 22 cm Tube secured with: Tape Dental Injury: Teeth and Oropharynx as per pre-operative assessment

## 2015-03-05 ENCOUNTER — Encounter (HOSPITAL_COMMUNITY): Payer: Self-pay | Admitting: Neurological Surgery

## 2015-03-05 DIAGNOSIS — M4722 Other spondylosis with radiculopathy, cervical region: Secondary | ICD-10-CM | POA: Diagnosis not present

## 2015-03-05 MED ORDER — OXYCODONE-ACETAMINOPHEN 5-325 MG PO TABS: 1.0000 | ORAL_TABLET | ORAL | Status: DC | PRN

## 2015-03-05 MED ORDER — METHOCARBAMOL 500 MG PO TABS: 500.0000 mg | ORAL_TABLET | Freq: Three times a day (TID) | ORAL | Status: DC | PRN

## 2015-03-05 NOTE — Care Management Note (Signed)
Case Management Note  Patient Details  Name: Karen [name] MRN: [id number] Date of Birth: 01/13/1971  Subjective/Objective:                    Action/Plan: Patient was admitted for an ACDF. Patient is being discharged home today. No discharge needs identified this hospitalization.  Expected Discharge Date:                  Expected Discharge Plan:  Home/Self Care  In-House Referral:     Discharge planning Services     Post Acute Care Choice:    Choice offered to:     DME Arranged:    DME Agency:     HH Arranged:    HH Agency:     Status of Service:  Completed, signed off  Medicare Important Message Given:    Date Medicare IM Given:    Medicare IM give by:    Date Additional Medicare IM Given:    Additional Medicare Important Message give by:     If discussed at Long Length of Stay Meetings, dates discussed:    Additional Comments:  Anda KraftRobarge, Comer Devins C, RN 03/05/2015, 10:50 AM

## 2015-03-05 NOTE — Discharge Summary (Signed)
Physician Discharge Summary  Patient ID: Karen [name] MRN: [id number] DOB/AGE: [birth year] 46 y.o.  Admit date: 03/04/2015 Discharge date: 03/05/2015  Admission Diagnoses: cervical radiculopathy   Discharge Diagnoses: same   Discharged Condition: good  Hospital Course: The patient was admitted on 03/04/2015 and taken to the operating room where the patient underwent ACDF. The patient tolerated the procedure well and was taken to the recovery room and then to the floor in stable condition. The hospital course was routine. There were no complications. The wound remained clean dry and intact. Pt had appropriate neck soreness. No complaints of arm pain or new N/T/W. The patient remained afebrile with stable vital signs, and tolerated a regular diet. The patient continued to increase activities, and pain was well controlled with oral pain medications.   Consults: None  Significant Diagnostic Studies:  Results for orders placed or performed during the hospital encounter of 02/24/15  Surgical pcr screen  Result Value Ref Range   MRSA, PCR NEGATIVE NEGATIVE   Staphylococcus aureus NEGATIVE NEGATIVE  Basic metabolic panel  Result Value Ref Range   Sodium 133 (L) 135 - 145 mmol/L   Potassium 3.9 3.5 - 5.1 mmol/L   Chloride 98 (L) 101 - 111 mmol/L   CO2 27 22 - 32 mmol/L   Glucose, Bld 96 65 - 99 mg/dL   BUN 6 6 - 20 mg/dL   Creatinine, Ser 4.09 0.44 - 1.00 mg/dL   Calcium 9.6 8.9 - 81.1 mg/dL   GFR calc non Af Amer >60 >60 mL/min   GFR calc Af Amer >60 >60 mL/min   Anion gap 8 5 - 15  CBC WITH DIFFERENTIAL  Result Value Ref Range   WBC 6.3 4.0 - 10.5 K/uL   RBC 4.09 3.87 - 5.11 MIL/uL   Hemoglobin 12.5 12.0 - 15.0 g/dL   HCT 91.4 78.2 - 95.6 %   MCV 92.7 78.0 - 100.0 fL   MCH 30.6 26.0 - 34.0 pg   MCHC 33.0 30.0 - 36.0 g/dL   RDW 21.3 08.6 - 57.8 %   Platelets 237 150 - 400 K/uL   Neutrophils Relative % 52 %   Neutro Abs 3.3 1.7 - 7.7 K/uL   Lymphocytes Relative 40 %    Lymphs Abs 2.5 0.7 - 4.0 K/uL   Monocytes Relative 8 %   Monocytes Absolute 0.5 0.1 - 1.0 K/uL   Eosinophils Relative 0 %   Eosinophils Absolute 0.0 0.0 - 0.7 K/uL   Basophils Relative 0 %   Basophils Absolute 0.0 0.0 - 0.1 K/uL  Protime-INR  Result Value Ref Range   Prothrombin Time 14.7 11.6 - 15.2 seconds   INR 1.13 0.00 - 1.49    Chest 2 View  02/24/2015  CLINICAL DATA:  Preop for cervical spine surgery. History of hypertension. Prior smoker. EXAM: CHEST  2 VIEW COMPARISON:  None. FINDINGS: The heart size and mediastinal contours are within normal limits. Both lungs are clear. The visualized skeletal structures are unremarkable. IMPRESSION: No active cardiopulmonary disease. Electronically Signed   By: Charlett Nose M.D.   On: 02/24/2015 12:34   Dg Cervical Spine 1 View  03/04/2015  CLINICAL DATA:  C6-7 ACDF EXAM: DG C-ARM 61-120 MIN; DG CERVICAL SPINE - 1 VIEW 4 seconds fluoroscopic time COMPARISON:  February 04, 2015 FINDINGS: Patient status post ACDF of C6-7 without malalignment. IMPRESSION: Status post ACDF of C6-7 without malalignment. Electronically Signed   By: Sherian Rein M.D.   On: 03/04/2015 16:06   Ct  Cervical Spine W Contrast  02/04/2015  CLINICAL DATA:  Neck pain.  RIGHT arm pain. FLUOROSCOPY TIME:  74 seconds.  Ten spot films. PROCEDURE: LUMBAR PUNCTURE FOR CERVICAL MYELOGRAM After thorough discussion of risks and benefits of the procedure including bleeding, infection, injury to nerves, blood vessels, adjacent structures as well as headache and CSF leak, written and oral informed consent was obtained. Consent was obtained by Dr. Davonna Belling. We discussed the high likelihood of obtaining a diagnostic study. Patient was positioned prone on the fluoroscopy table. Local anesthesia was provided with 1% lidocaine without epinephrine after prepped and draped in the usual sterile fashion. Puncture was performed at L3-L4 using a 3 1/2 inch 22-gauge spinal needle via midline approach.  Using a single pass through the dura, the needle was placed within the thecal sac, with return of clear CSF. 10 mL of Omnipaque-300 was injected into the thecal sac, with normal opacification of the nerve roots and cauda equina consistent with free flow within the subarachnoid space. The patient was then moved to the trendelenburg position and contrast flowed into the Cervical spine region. I personally performed the lumbar puncture and administered the intrathecal contrast. I also personally supervised acquisition of the myelogram images. TECHNIQUE: Contiguous axial images were obtained through the Cervical spine after the intrathecal infusion of infusion. Coronal and sagittal reconstructions were obtained of the axial image sets. FINDINGS: CERVICAL MYELOGRAM FINDINGS: Good opacification cervical subarachnoid space. Advanced disc space narrowing at C6-C7. Ventral ridging is accompanied by RIGHT-sided uncinate spurring, with RIGHT C7 nerve root impingement. No cord flattening. CT CERVICAL MYELOGRAM FINDINGS: Mild straightening of the normal cervical lordosis could be positional or due to spasm. No subluxation. No lung nodules. The individual disc spaces were examined as follows: C2-3:  Normal. C3-4:  Mild bulge.  No impingement. C4-5:  Minor leftward spurring.  No impingement. C5-6:  Mild bulge.  No impingement. C6-7: Significant disc space narrowing. Endplate reactive changes much greater on the RIGHT. RIGHT-sided uncinate spurring with RIGHT C7 nerve root displacement. No significant soft disc component. No spinal stenosis or cord flattening. C7-T1:  Shallow protrusion.  No impingement. IMPRESSION: The dominant RIGHT-sided abnormality is at C6-C7, where disc space narrowing and RIGHT-sided uncinate spurring resulting in RIGHT C7 nerve root impingement. This is better appreciated on myelography than on MR. Minor disc disease C3-4 through C7-T1, noncompressive. Electronically Signed   By: Elsie Stain M.D.   On:  02/04/2015 10:51   Dg C-arm 1-60 Min  03/04/2015  CLINICAL DATA:  C6-7 ACDF EXAM: DG C-ARM 61-120 MIN; DG CERVICAL SPINE - 1 VIEW 4 seconds fluoroscopic time COMPARISON:  February 04, 2015 FINDINGS: Patient status post ACDF of C6-7 without malalignment. IMPRESSION: Status post ACDF of C6-7 without malalignment. Electronically Signed   By: Sherian Rein M.D.   On: 03/04/2015 16:06   Dg Myelography Lumbar Inj Cervical  02/04/2015  CLINICAL DATA:  Neck pain.  RIGHT arm pain. FLUOROSCOPY TIME:  74 seconds.  Ten spot films. PROCEDURE: LUMBAR PUNCTURE FOR CERVICAL MYELOGRAM After thorough discussion of risks and benefits of the procedure including bleeding, infection, injury to nerves, blood vessels, adjacent structures as well as headache and CSF leak, written and oral informed consent was obtained. Consent was obtained by Dr. Davonna Belling. We discussed the high likelihood of obtaining a diagnostic study. Patient was positioned prone on the fluoroscopy table. Local anesthesia was provided with 1% lidocaine without epinephrine after prepped and draped in the usual sterile fashion. Puncture was performed at L3-L4  using a 3 1/2 inch 22-gauge spinal needle via midline approach. Using a single pass through the dura, the needle was placed within the thecal sac, with return of clear CSF. 10 mL of Omnipaque-300 was injected into the thecal sac, with normal opacification of the nerve roots and cauda equina consistent with free flow within the subarachnoid space. The patient was then moved to the trendelenburg position and contrast flowed into the Cervical spine region. I personally performed the lumbar puncture and administered the intrathecal contrast. I also personally supervised acquisition of the myelogram images. TECHNIQUE: Contiguous axial images were obtained through the Cervical spine after the intrathecal infusion of infusion. Coronal and sagittal reconstructions were obtained of the axial image sets. FINDINGS:  CERVICAL MYELOGRAM FINDINGS: Good opacification cervical subarachnoid space. Advanced disc space narrowing at C6-C7. Ventral ridging is accompanied by RIGHT-sided uncinate spurring, with RIGHT C7 nerve root impingement. No cord flattening. CT CERVICAL MYELOGRAM FINDINGS: Mild straightening of the normal cervical lordosis could be positional or due to spasm. No subluxation. No lung nodules. The individual disc spaces were examined as follows: C2-3:  Normal. C3-4:  Mild bulge.  No impingement. C4-5:  Minor leftward spurring.  No impingement. C5-6:  Mild bulge.  No impingement. C6-7: Significant disc space narrowing. Endplate reactive changes much greater on the RIGHT. RIGHT-sided uncinate spurring with RIGHT C7 nerve root displacement. No significant soft disc component. No spinal stenosis or cord flattening. C7-T1:  Shallow protrusion.  No impingement. IMPRESSION: The dominant RIGHT-sided abnormality is at C6-C7, where disc space narrowing and RIGHT-sided uncinate spurring resulting in RIGHT C7 nerve root impingement. This is better appreciated on myelography than on MR. Minor disc disease C3-4 through C7-T1, noncompressive. Electronically Signed   By: Elsie StainJohn T Curnes M.D.   On: 02/04/2015 10:51    Antibiotics:  Anti-infectives    Start     Dose/Rate Route Frequency Ordered Stop   03/04/15 2300  ceFAZolin (ANCEF) IVPB 1 g/50 mL premix     1 g 100 mL/hr over 30 Minutes Intravenous Every 8 hours 03/04/15 1737 03/05/15 0651   03/04/15 1500  bacitracin 50,000 Units in sodium chloride irrigation 0.9 % 500 mL irrigation  Status:  Discontinued       As needed 03/04/15 1520 03/04/15 1612   03/04/15 1315  ceFAZolin (ANCEF) IVPB 2 g/50 mL premix     2 g 100 mL/hr over 30 Minutes Intravenous NOW 03/04/15 1246 03/04/15 1452      Discharge Exam: Blood pressure 112/71, pulse 84, temperature 98.5 F (36.9 C), temperature source Oral, resp. rate 20, height 5\' 4"  (1.626 m), weight 76.749 kg (169 lb 3.2 oz), SpO2 99  %. Neurologic: Grossly normal incisionCDI  Discharge Medications:     Medication List    STOP taking these medications        ibuprofen 200 MG tablet  Commonly known as:  ADVIL,MOTRIN     tiZANidine 4 MG tablet  Commonly known as:  ZANAFLEX      TAKE these medications        amLODipine 2.5 MG tablet  Commonly known as:  NORVASC  Take 2.5 mg by mouth daily.     benztropine 0.5 MG tablet  Commonly known as:  COGENTIN  Take 0.5 mg by mouth daily.     busPIRone 10 MG tablet  Commonly known as:  BUSPAR  Take 10 mg by mouth 2 (two) times daily.     DULoxetine 60 MG capsule  Commonly known as:  CYMBALTA  Take  60 mg by mouth daily.     LATUDA 60 MG Tabs  Generic drug:  Lurasidone HCl  Take 1 tablet by mouth every evening.     levETIRAcetam 750 MG tablet  Commonly known as:  KEPPRA  Take 750 mg by mouth daily.     methocarbamol 500 MG tablet  Commonly known as:  ROBAXIN  Take 1 tablet (500 mg total) by mouth every 8 (eight) hours as needed for muscle spasms.     omeprazole 40 MG capsule  Commonly known as:  PRILOSEC  Take 1 capsule (40 mg total) by mouth 2 (two) times daily as needed (Upset stomach).     oxyCODONE-acetaminophen 5-325 MG tablet  Commonly known as:  PERCOCET/ROXICET  Take 1-2 tablets by mouth every 4 (four) hours as needed for moderate pain.     promethazine 25 MG tablet  Commonly known as:  PHENERGAN  Take 25 mg by mouth every 6 (six) hours as needed for nausea or vomiting.        Disposition: home   Final Dx: ACDF      Discharge Instructions     Remove dressing in 72 hours    Complete by:  As directed      Call MD for:  difficulty breathing, headache or visual disturbances    Complete by:  As directed      Call MD for:  persistant nausea and vomiting    Complete by:  As directed      Call MD for:  redness, tenderness, or signs of infection (pain, swelling, redness, odor or green/yellow discharge around incision site)    Complete by:   As directed      Call MD for:  severe uncontrolled pain    Complete by:  As directed      Call MD for:  temperature >100.4    Complete by:  As directed      Diet - low sodium heart healthy    Complete by:  As directed      Discharge instructions    Complete by:  As directed   No driving, no heavy lifting     Increase activity slowly    Complete by:  As directed            Follow-up Information    Follow up with Daveda Larock S, MD. Schedule an appointment as soon as possible for a visit in 2 weeks.   Specialty:  Neurosurgery   Contact information:   [address] [place] [address] [phone]        Signed: Tia Alert 03/05/2015, 9:41 AM

## 2015-03-05 NOTE — Progress Notes (Signed)
Pt discharged from hospital per MD order. Pt educated and verbalized understanding regarding discharge instructions. All IV's removed and all questions and concerns answered by RN. Pt discharged via wheelchair to son's vehicle to go home.

## 2015-04-17 ENCOUNTER — Emergency Department (HOSPITAL_COMMUNITY): Payer: Medicare Other

## 2015-04-17 ENCOUNTER — Encounter (HOSPITAL_COMMUNITY): Payer: Self-pay | Admitting: *Deleted

## 2015-04-17 ENCOUNTER — Emergency Department (HOSPITAL_COMMUNITY)
Admission: EM | Admit: 2015-04-17 | Discharge: 2015-04-18 | Disposition: A | Discharge status: Home / Self Care | Payer: Medicare Other | Attending: Emergency Medicine | Admitting: Emergency Medicine

## 2015-04-17 DIAGNOSIS — Z79899 Other long term (current) drug therapy: Secondary | ICD-10-CM | POA: Insufficient documentation

## 2015-04-17 DIAGNOSIS — R0602 Shortness of breath: Secondary | ICD-10-CM | POA: Diagnosis present

## 2015-04-17 DIAGNOSIS — Z86711 Personal history of pulmonary embolism: Secondary | ICD-10-CM | POA: Diagnosis not present

## 2015-04-17 DIAGNOSIS — T148 Other injury of unspecified body region: Secondary | ICD-10-CM | POA: Insufficient documentation

## 2015-04-17 DIAGNOSIS — Z87891 Personal history of nicotine dependence: Secondary | ICD-10-CM | POA: Insufficient documentation

## 2015-04-17 DIAGNOSIS — F4310 Post-traumatic stress disorder, unspecified: Secondary | ICD-10-CM | POA: Diagnosis not present

## 2015-04-17 DIAGNOSIS — Z86718 Personal history of other venous thrombosis and embolism: Secondary | ICD-10-CM | POA: Insufficient documentation

## 2015-04-17 DIAGNOSIS — F319 Bipolar disorder, unspecified: Secondary | ICD-10-CM | POA: Insufficient documentation

## 2015-04-17 DIAGNOSIS — K219 Gastro-esophageal reflux disease without esophagitis: Secondary | ICD-10-CM | POA: Diagnosis not present

## 2015-04-17 DIAGNOSIS — Z862 Personal history of diseases of the blood and blood-forming organs and certain disorders involving the immune mechanism: Secondary | ICD-10-CM | POA: Insufficient documentation

## 2015-04-17 DIAGNOSIS — M1990 Unspecified osteoarthritis, unspecified site: Secondary | ICD-10-CM | POA: Diagnosis not present

## 2015-04-17 DIAGNOSIS — M79662 Pain in left lower leg: Secondary | ICD-10-CM | POA: Insufficient documentation

## 2015-04-17 DIAGNOSIS — T148XXA Other injury of unspecified body region, initial encounter: Secondary | ICD-10-CM

## 2015-04-17 DIAGNOSIS — M79661 Pain in right lower leg: Secondary | ICD-10-CM | POA: Diagnosis not present

## 2015-04-17 DIAGNOSIS — F419 Anxiety disorder, unspecified: Secondary | ICD-10-CM | POA: Insufficient documentation

## 2015-04-17 DIAGNOSIS — I10 Essential (primary) hypertension: Secondary | ICD-10-CM | POA: Insufficient documentation

## 2015-04-17 LAB — CBC WITH DIFFERENTIAL/PLATELET
BASOS ABS: 0 10*3/uL (ref 0.0–0.1)
Basophils Relative: 0 %
EOS ABS: 0.1 10*3/uL (ref 0.0–0.7)
EOS PCT: 1 %
HCT: 35.3 % — ABNORMAL LOW (ref 36.0–46.0)
HEMOGLOBIN: 11.8 g/dL — AB (ref 12.0–15.0)
LYMPHS ABS: 3.8 10*3/uL (ref 0.7–4.0)
LYMPHS PCT: 47 %
MCH: 31 pg (ref 26.0–34.0)
MCHC: 33.4 g/dL (ref 30.0–36.0)
MCV: 92.7 fL (ref 78.0–100.0)
Monocytes Absolute: 0.5 10*3/uL (ref 0.1–1.0)
Monocytes Relative: 7 %
NEUTROS PCT: 45 %
Neutro Abs: 3.7 10*3/uL (ref 1.7–7.7)
PLATELETS: 218 10*3/uL (ref 150–400)
RBC: 3.81 MIL/uL — AB (ref 3.87–5.11)
RDW: 12.4 % (ref 11.5–15.5)
WBC: 8.2 10*3/uL (ref 4.0–10.5)

## 2015-04-17 LAB — COMPREHENSIVE METABOLIC PANEL
ALK PHOS: 88 U/L (ref 38–126)
ALT: 17 U/L (ref 14–54)
AST: 18 U/L (ref 15–41)
Albumin: 3.7 g/dL (ref 3.5–5.0)
Anion gap: 7 (ref 5–15)
BUN: 7 mg/dL (ref 6–20)
CALCIUM: 9.5 mg/dL (ref 8.9–10.3)
CHLORIDE: 102 mmol/L (ref 101–111)
CO2: 29 mmol/L (ref 22–32)
CREATININE: 0.9 mg/dL (ref 0.44–1.00)
GFR calc non Af Amer: >60 mL/min (ref >60)
GLUCOSE: 89 mg/dL (ref 65–99)
Potassium: 4 mmol/L (ref 3.5–5.1)
SODIUM: 138 mmol/L (ref 135–145)
Total Bilirubin: 0.5 mg/dL (ref 0.3–1.2)
Total Protein: 6.4 g/dL — ABNORMAL LOW (ref 6.5–8.1)

## 2015-04-17 LAB — BRAIN NATRIURETIC PEPTIDE: B Natriuretic Peptide: 23.8 pg/mL (ref 0.0–100.0)

## 2015-04-17 LAB — TROPONIN I

## 2015-04-17 MED ORDER — ALBUTEROL SULFATE (2.5 MG/3ML) 0.083% IN NEBU: 5.0000 mg | INHALATION_SOLUTION | Freq: Once | RESPIRATORY_TRACT | Status: DC

## 2015-04-17 NOTE — ED Notes (Signed)
thye pt is c/o sob and lt leg pain for 2 days  With abd pain for awhile.  lmp none

## 2015-04-17 NOTE — ED Notes (Signed)
No hx of copd or asthma.  No long car trips or airplane trips

## 2015-04-18 ENCOUNTER — Emergency Department (HOSPITAL_COMMUNITY): Payer: Medicare Other

## 2015-04-18 ENCOUNTER — Encounter (HOSPITAL_COMMUNITY): Payer: Self-pay | Admitting: Radiology

## 2015-04-18 DIAGNOSIS — R0602 Shortness of breath: Secondary | ICD-10-CM | POA: Diagnosis not present

## 2015-04-18 MED ORDER — IOHEXOL 350 MG/ML SOLN
80.0000 mL | Freq: Once | INTRAVENOUS | Status: AC | PRN
  Administered 2015-04-18: 100 mL via INTRAVENOUS

## 2015-04-18 MED ORDER — TRAMADOL HCL 50 MG PO TABS: 50.0000 mg | ORAL_TABLET | Freq: Four times a day (QID) | ORAL | Status: DC | PRN

## 2015-04-18 MED ORDER — ENOXAPARIN SODIUM 80 MG/0.8ML ~~LOC~~ SOLN
1.0000 mg/kg | Freq: Once | SUBCUTANEOUS | Status: AC
  Administered 2015-04-18: 80 mg via SUBCUTANEOUS
  Filled 2015-04-18: qty 0.8

## 2015-04-18 NOTE — Discharge Instructions (Signed)
Shortness of Breath Shortness of breath means you have trouble breathing. It could also mean that you have a medical problem. You should get immediate medical care for shortness of breath. CAUSES   Not enough oxygen in the air such as with high altitudes or a smoke-filled room.  Certain lung diseases, infections, or problems.  Heart disease or conditions, such as angina or heart failure.  Low red blood cells (anemia).  Poor physical fitness, which can cause shortness of breath when you exercise.  Chest or back injuries or stiffness.  Being overweight.  Smoking.  Anxiety, which can make you feel like you are not getting enough air. DIAGNOSIS  Serious medical problems can often be found during your physical exam. Tests may also be done to determine why you are having shortness of breath. Tests may include:  Chest X-rays.  Lung function tests.  Blood tests.  An electrocardiogram (ECG).  An ambulatory electrocardiogram. An ambulatory ECG records your heartbeat patterns over a 24-hour period.  Exercise testing.  A transthoracic echocardiogram (TTE). During echocardiography, sound waves are used to evaluate how blood flows through your heart.  A transesophageal echocardiogram (TEE).  Imaging scans. Your health care provider may not be able to find a cause for your shortness of breath after your exam. In this case, it is important to have a follow-up exam with your health care provider as directed.  TREATMENT  Treatment for shortness of breath depends on the cause of your symptoms and can vary greatly. HOME CARE INSTRUCTIONS   Do not smoke. Smoking is a common cause of shortness of breath. If you smoke, ask for help to quit.  Avoid being around chemicals or things that may bother your breathing, such as paint fumes and dust.  Rest as needed. Slowly resume your usual activities.  If medicines were prescribed, take them as directed for the full length of time directed. This  includes oxygen and any inhaled medicines.  Keep all follow-up appointments as directed by your health care provider. SEEK MEDICAL CARE IF:   Your condition does not improve in the time expected.  You have a hard time doing your normal activities even with rest.  You have any new symptoms. SEEK IMMEDIATE MEDICAL CARE IF:   Your shortness of breath gets worse.  You feel light-headed, faint, or develop a cough not controlled with medicines.  You start coughing up blood.  You have pain with breathing.  You have chest pain or pain in your arms, shoulders, or abdomen.  You have a fever.  You are unable to walk up stairs or exercise the way you normally do. MAKE SURE YOU:  Understand these instructions.  Will watch your condition.  Will get help right away if you are not doing well or get worse.   This information is not intended to replace advice given to you by your health care provider. Make sure you discuss any questions you have with your health care provider.   Document Released: 12/28/2000 Document Revised: 04/09/2013 Document Reviewed: 06/20/2011 Elsevier Interactive Patient Education 2016 Else[employer] Muscle Strain A muscle strain is an injury that occurs when a muscle is stretched beyond its normal length. Usually a small number of muscle fibers are torn when this happens. Muscle strain is rated in degrees. First-degree strains have the least amount of muscle fiber tearing and pain. Second-degree and third-degree strains have increasingly more tearing and pain.  Usually, recovery from muscle strain takes 1-2 weeks. Complete healing takes 5-6 weeks.  CAUSES  Muscle strain happens when a sudden, violent force placed on a muscle stretches it too far. This may occur with lifting, sports, or a fall.  RISK FACTORS Muscle strain is especially common in athletes.  SIGNS AND SYMPTOMS At the site of the muscle strain, there may  be:  Pain.  Bruising.  Swelling.  Difficulty using the muscle due to pain or lack of normal function. DIAGNOSIS  Your health care provider will perform a physical exam and ask about your medical history. TREATMENT  Often, the best treatment for a muscle strain is resting, icing, and applying cold compresses to the injured area.  HOME CARE INSTRUCTIONS   Use the PRICE method of treatment to promote muscle healing during the first 2-3 days after your injury. The PRICE method involves:  Protecting the muscle from being injured again.  Restricting your activity and resting the injured body part.  Icing your injury. To do this, put ice in a plastic bag. Place a towel between your skin and the bag. Then, apply the ice and leave it on from 15-20 minutes each hour. After the third day, switch to moist heat packs.  Apply compression to the injured area with a splint or elastic bandage. Be careful not to wrap it too tightly. This may interfere with blood circulation or increase swelling.  Elevate the injured body part above the level of your heart as often as you can.  Only take over-the-counter or prescription medicines for pain, discomfort, or fever as directed by your health care provider.  Warming up prior to exercise helps to prevent future muscle strains. SEEK MEDICAL CARE IF:   You have increasing pain or swelling in the injured area.  You have numbness, tingling, or a significant loss of strength in the injured area. MAKE SURE YOU:   Understand these instructions.  Will watch your condition.  Will get help right away if you are not doing well or get worse.   This information is not intended to replace advice given to you by your health care provider. Make sure you discuss any questions you have with your health care provider.   Document Released: 04/04/2005 Document Revised: 01/23/2013 Document Reviewed: 11/01/2012 Elsevier Interactive Patient Education [employer]2016 Elsevier  Inc[employer]

## 2015-04-18 NOTE — ED Provider Notes (Signed)
CSN: [id number]     Arrival date & time 04/17/15  1918 History  By signing my name below, I, Sonum Patel, attest that this documentation has been prepared under the direction and in the presence of Gilda Crease, MD. Electronically Signed: Sonum Patel, Neurosurgeon. 04/18/2015. 12:08 AM.    Chief Complaint  Patient presents with  . Shortness of Breath   The history is provided by the patient. No language interpreter was used.     HPI Comments: Karen [name] is a 44 y.o. female with past medical history of DVT, PE who presents to the Emergency Department complaining of intermittent, unchanged SOB with activity such as walking and talking for the past 2 days. She reports associated lower leg pain and swelling, left greater than right. She denies recent long distance travel but reports a past history of a DVT/PE. She states she is not currently taking any anti-coagulants. She denies any other complaints at this time.   Past Medical History  Diagnosis Date  . Hypertension   . Migraine   . IBS (irritable bowel syndrome)   . Bipolar affective disorder (HCC)   . PTSD (post-traumatic stress disorder)   . Pulmonary embolism (HCC)   . DVT (deep venous thrombosis) (HCC)   . Bowel obstruction (HCC)   . Depression   . Anxiety   . GERD (gastroesophageal reflux disease)   . Arthritis   . Anemia    Past Surgical History  Procedure Laterality Date  . Abdominal surgery    . Abdominal hysterectomy    . Knee arthroscopy    . Appendectomy    . Cholecystectomy    . Tubal ligation    . Anterior cervical decomp/discectomy fusion N/A 03/04/2015    Procedure: ANTERIOR CERVICAL DECOMPRESSION/DISCECTOMY FUSION CERVICAL SIX-SEVEN;  Surgeon: Tia Alert, MD;  Location: [facility];  Service: Neurosurgery;  Laterality: N/A;   No family history on file. Social History  Substance Use Topics  . Smoking status: Former Smoker -- 0.50 packs/day for 10 years    Types: Cigarettes    Quit date: 04/19/2011   . Smokeless tobacco: None  . Alcohol Use: Yes     Comment: drinks 1-2 times monthly   OB History    No data available     Review of Systems  Respiratory: Positive for shortness of breath.   Cardiovascular: Positive for leg swelling.  Musculoskeletal: Positive for myalgias.  All other systems reviewed and are negative.   Allergies  Benadryl; Buprenorphine hcl; Morphine and related; Lyrica; Neurontin; Aspirin; Sulfa antibiotics; and Ciprofloxacin hcl  Home Medications   Prior to Admission medications   Medication Sig Start Date End Date Taking? Authorizing Provider  amLODipine (NORVASC) 2.5 MG tablet Take 2.5 mg by mouth daily.  07/24/14   Historical Provider, MD  benztropine (COGENTIN) 0.5 MG tablet Take 0.5 mg by mouth daily.    Historical Provider, MD  busPIRone (BUSPAR) 10 MG tablet Take 10 mg by mouth 2 (two) times daily.    Historical Provider, MD  DULoxetine (CYMBALTA) 60 MG capsule Take 60 mg by mouth daily.    Historical Provider, MD  levETIRAcetam (KEPPRA) 750 MG tablet Take 750 mg by mouth daily.  11/17/14 11/17/15  Historical Provider, MD  Lurasidone HCl (LATUDA) 60 MG TABS Take 1 tablet by mouth every evening.    Historical Provider, MD  methocarbamol (ROBAXIN) 500 MG tablet Take 1 tablet (500 mg total) by mouth every 8 (eight) hours as needed for muscle spasms. 03/05/15  Tia Alertavid S Jones, MD  omeprazole (PRILOSEC) 40 MG capsule Take 1 capsule (40 mg total) by mouth 2 (two) times daily as needed (Upset stomach). 12/10/12   Thermon LeylandLaura A Davis, NP  oxyCODONE-acetaminophen (PERCOCET/ROXICET) 5-325 MG tablet Take 1-2 tablets by mouth every 4 (four) hours as needed for moderate pain. 03/05/15   Tia Alertavid S Jones, MD  promethazine (PHENERGAN) 25 MG tablet Take 25 mg by mouth every 6 (six) hours as needed for nausea or vomiting.    Historical Provider, MD   BP 122/76 mmHg  Pulse 75  Temp(Src) 97.9 F (36.6 C) (Oral)  Resp 16  Ht 5\' 4"  (1.626 m)  Wt 172 lb (78.019 kg)  BMI 29.51 kg/m2   SpO2 97% Physical Exam  Constitutional: She is oriented to person, place, and time. She appears well-developed and well-nourished. No distress.  HENT:  Head: Normocephalic and atraumatic.  Right Ear: Hearing normal.  Left Ear: Hearing normal.  Nose: Nose normal.  Mouth/Throat: Oropharynx is clear and moist and mucous membranes are normal.  Eyes: Conjunctivae and EOM are normal. Pupils are equal, round, and reactive to light.  Neck: Normal range of motion. Neck supple.  Cardiovascular: Normal rate, regular rhythm, S1 normal and S2 normal.  Exam reveals no gallop and no friction rub.   No murmur heard. Pulmonary/Chest: Effort normal and breath sounds normal. No respiratory distress. She exhibits no tenderness.  Abdominal: Soft. Normal appearance and bowel sounds are normal. There is no hepatosplenomegaly. There is no tenderness. There is no rebound, no guarding, no tenderness at McBurney's point and negative Murphy's sign. No hernia.  Musculoskeletal: Normal range of motion. She exhibits tenderness.  Mild tenderness to left calf. No visible increase in circumference, no venous chords, negative Homans' sign   Neurological: She is alert and oriented to person, place, and time. She has normal strength. No cranial nerve deficit or sensory deficit. Coordination normal. GCS eye subscore is 4. GCS verbal subscore is 5. GCS motor subscore is 6.  Skin: Skin is warm, dry and intact. No rash noted. No cyanosis.  Psychiatric: She has a normal mood and affect. Her speech is normal and behavior is normal. Thought content normal.  Nursing note and vitals reviewed.   ED Course  Procedures (including critical care time)  DIAGNOSTIC STUDIES: Oxygen Saturation is 97% on RA, adequate by my interpretation.    COORDINATION OF CARE: 12:15 AM Discussed treatment plan with pt at bedside and pt agreed to plan.   Labs Review Labs Reviewed  CBC WITH DIFFERENTIAL/PLATELET - Abnormal; Notable for the following:     RBC 3.81 (*)    Hemoglobin 11.8 (*)    HCT 35.3 (*)    All other components within normal limits  COMPREHENSIVE METABOLIC PANEL - Abnormal; Notable for the following:    Total Protein 6.4 (*)    All other components within normal limits  TROPONIN I  BRAIN NATRIURETIC PEPTIDE    Imaging Review Dg Chest 2 View  04/17/2015  CLINICAL DATA:  Initial encounter for shortness of breath with left leg pain and edema x3 days. EXAM: CHEST  2 VIEW COMPARISON:  02/24/2015. FINDINGS: The heart size and mediastinal contours are within normal limits. Both lungs are clear. The visualized skeletal structures are unremarkable. IMPRESSION: No active cardiopulmonary disease. Electronically Signed   By: Kennith CenterEric  Mansell M.D.   On: 04/17/2015 20:22   I have personally reviewed and evaluated these images and lab results as part of my medical decision-making.   EKG Interpretation  Date/Time:  Friday April 17 2015 19:29:42 EST Ventricular Rate:  76 PR Interval:  170 QRS Duration: 66 QT Interval:  386 QTC Calculation: 434 R Axis:   27 Text Interpretation:  Normal sinus rhythm Normal ECG Confirmed by POLLINA   MD, CHRISTOPHER ([id number]) on 04/18/2015 12:07:03 AM      MDM   Final diagnoses:  None  Shortness of breath  Presents to the emergency department with complaints of shortness of breath. Patient has been noticing this for 2 days. She also has been experiencing left leg pain. Examination revealed very slight tenderness of the left calf without any significant swelling. Patient does, however, have a history of DVT and PE. She is no longer on anticoagulation. Cardiac workup is negative. No evidence of acute coronary syndrome or congestive heart failure. CT angios shows no evidence of PE. Patient will begin Lovenox, discharge and have venous duplex in the a.m.  I personally performed the services described in this documentation, which was scribed in my presence. The recorded information has been  reviewed and is accurate.    Gilda Crease, MD 04/18/15 [id number]

## 2015-04-20 ENCOUNTER — Ambulatory Visit (HOSPITAL_COMMUNITY): Payer: Medicare Other | Attending: Emergency Medicine

## 2015-04-23 ENCOUNTER — Emergency Department (HOSPITAL_COMMUNITY): Payer: Medicare Other

## 2015-04-23 ENCOUNTER — Emergency Department (HOSPITAL_COMMUNITY)
Admission: EM | Admit: 2015-04-23 | Discharge: 2015-04-23 | Disposition: A | Discharge status: Home / Self Care | Payer: Medicare Other | Attending: Emergency Medicine | Admitting: Emergency Medicine

## 2015-04-23 ENCOUNTER — Encounter (HOSPITAL_COMMUNITY): Payer: Self-pay | Admitting: Emergency Medicine

## 2015-04-23 DIAGNOSIS — Z79899 Other long term (current) drug therapy: Secondary | ICD-10-CM | POA: Insufficient documentation

## 2015-04-23 DIAGNOSIS — Z86718 Personal history of other venous thrombosis and embolism: Secondary | ICD-10-CM | POA: Diagnosis not present

## 2015-04-23 DIAGNOSIS — Z86711 Personal history of pulmonary embolism: Secondary | ICD-10-CM | POA: Insufficient documentation

## 2015-04-23 DIAGNOSIS — R600 Localized edema: Secondary | ICD-10-CM

## 2015-04-23 DIAGNOSIS — F419 Anxiety disorder, unspecified: Secondary | ICD-10-CM | POA: Diagnosis not present

## 2015-04-23 DIAGNOSIS — Z87891 Personal history of nicotine dependence: Secondary | ICD-10-CM | POA: Insufficient documentation

## 2015-04-23 DIAGNOSIS — R2243 Localized swelling, mass and lump, lower limb, bilateral: Secondary | ICD-10-CM | POA: Diagnosis not present

## 2015-04-23 DIAGNOSIS — Z8719 Personal history of other diseases of the digestive system: Secondary | ICD-10-CM | POA: Diagnosis not present

## 2015-04-23 DIAGNOSIS — Z862 Personal history of diseases of the blood and blood-forming organs and certain disorders involving the immune mechanism: Secondary | ICD-10-CM | POA: Insufficient documentation

## 2015-04-23 DIAGNOSIS — I10 Essential (primary) hypertension: Secondary | ICD-10-CM | POA: Insufficient documentation

## 2015-04-23 DIAGNOSIS — F319 Bipolar disorder, unspecified: Secondary | ICD-10-CM | POA: Diagnosis not present

## 2015-04-23 DIAGNOSIS — Z8739 Personal history of other diseases of the musculoskeletal system and connective tissue: Secondary | ICD-10-CM | POA: Diagnosis not present

## 2015-04-23 DIAGNOSIS — G43909 Migraine, unspecified, not intractable, without status migrainosus: Secondary | ICD-10-CM | POA: Diagnosis not present

## 2015-04-23 DIAGNOSIS — R0602 Shortness of breath: Secondary | ICD-10-CM | POA: Diagnosis present

## 2015-04-23 LAB — CBC
HEMATOCRIT: 35.6 % — AB (ref 36.0–46.0)
HEMOGLOBIN: 11.8 g/dL — AB (ref 12.0–15.0)
MCH: 31.1 pg (ref 26.0–34.0)
MCHC: 33.1 g/dL (ref 30.0–36.0)
MCV: 93.9 fL (ref 78.0–100.0)
Platelets: 261 10*3/uL (ref 150–400)
RBC: 3.79 MIL/uL — AB (ref 3.87–5.11)
RDW: 12.6 % (ref 11.5–15.5)
WBC: 8.8 10*3/uL (ref 4.0–10.5)

## 2015-04-23 LAB — COMPREHENSIVE METABOLIC PANEL
ALBUMIN: 4.1 g/dL (ref 3.5–5.0)
ALK PHOS: 89 U/L (ref 38–126)
ALT: 15 U/L (ref 14–54)
AST: 15 U/L (ref 15–41)
Anion gap: 9 (ref 5–15)
BILIRUBIN TOTAL: 0.8 mg/dL (ref 0.3–1.2)
BUN: 10 mg/dL (ref 6–20)
CALCIUM: 9.1 mg/dL (ref 8.9–10.3)
CO2: 27 mmol/L (ref 22–32)
Chloride: 101 mmol/L (ref 101–111)
Creatinine, Ser: 0.81 mg/dL (ref 0.44–1.00)
GFR calc Af Amer: >60 mL/min (ref >60)
GFR calc non Af Amer: >60 mL/min (ref >60)
GLUCOSE: 94 mg/dL (ref 65–99)
Potassium: 3.4 mmol/L — ABNORMAL LOW (ref 3.5–5.1)
Sodium: 137 mmol/L (ref 135–145)
TOTAL PROTEIN: 7.3 g/dL (ref 6.5–8.1)

## 2015-04-23 LAB — I-STAT TROPONIN, ED: Troponin i, poc: 0 ng/mL (ref 0.00–0.08)

## 2015-04-23 LAB — D-DIMER, QUANTITATIVE: D-Dimer, Quant: 0.3 ug/mL-FEU (ref 0.00–0.50)

## 2015-04-23 MED ORDER — FUROSEMIDE 20 MG PO TABS: 20.0000 mg | ORAL_TABLET | Freq: Every day | ORAL | Status: AC

## 2015-04-23 NOTE — ED Notes (Signed)
Per pt, states SOB since last Friday-states B/L leg swelling-sta5tes she was worked up last week for same symptoms-eval was negative

## 2015-04-23 NOTE — Discharge Instructions (Signed)

## 2015-04-23 NOTE — ED Provider Notes (Signed)
CSN: [id number]     Arrival date & time 04/23/15  1632 History   First MD Initiated Contact with Patient 04/23/15 2052     Chief Complaint  Patient presents with  . Shortness of Breath     (Consider location/radiation/quality/duration/timing/severity/associated sxs/prior Treatment) HPI Comments: Patient here complaining of worsening chronic lower extremity swelling times several weeks. She also notes increased dyspnea 1 week. Denies any pleuritic chest pain. No cough or congestion. No fever or chills. Was seen for similar symptoms 5 days ago and had an outpatient DVT study scheduled after receiving Lovenox. She did not keep that appointment. Does have a history of DVT 2 in the past from an unknown reason. Does not take any blood thinners chronically. Denies any orthopnea does have some slight dyspnea on exertion.  Patient is a 45 y.o. female presenting with shortness of breath. The history is provided by the patient.  Shortness of Breath   Past Medical History  Diagnosis Date  . Hypertension   . Migraine   . IBS (irritable bowel syndrome)   . Bipolar affective disorder (HCC)   . PTSD (post-traumatic stress disorder)   . Pulmonary embolism (HCC)   . DVT (deep venous thrombosis) (HCC)   . Bowel obstruction (HCC)   . Depression   . Anxiety   . GERD (gastroesophageal reflux disease)   . Arthritis   . Anemia    Past Surgical History  Procedure Laterality Date  . Abdominal surgery    . Abdominal hysterectomy    . Knee arthroscopy    . Appendectomy    . Cholecystectomy    . Tubal ligation    . Anterior cervical decomp/discectomy fusion N/A 03/04/2015    Procedure: ANTERIOR CERVICAL DECOMPRESSION/DISCECTOMY FUSION CERVICAL SIX-SEVEN;  Surgeon: Tia Alertavid S Jones, MD;  Location: [facility];  Service: Neurosurgery;  Laterality: N/A;   No family history on file. Social History  Substance Use Topics  . Smoking status: Former Smoker -- 0.50 packs/day for 10 years    Types: Cigarettes     Quit date: 04/19/2011  . Smokeless tobacco: None  . Alcohol Use: Yes     Comment: drinks 1-2 times monthly   OB History    No data available     Review of Systems  Respiratory: Positive for shortness of breath.   All other systems reviewed and are negative.     Allergies  Benadryl; Buprenorphine hcl; Morphine and related; Lyrica; Neurontin; Aspirin; Sulfa antibiotics; and Ciprofloxacin hcl  Home Medications   Prior to Admission medications   Medication Sig Start Date End Date Taking? Authorizing Provider  amLODipine (NORVASC) 2.5 MG tablet Take 2.5 mg by mouth daily.  07/24/14   Historical Provider, MD  citalopram (CELEXA) 20 MG tablet Take 20 mg by mouth daily.    Historical Provider, MD  clonazePAM (KLONOPIN) 0.5 MG tablet Take 0.5 mg by mouth daily.    Historical Provider, MD  levETIRAcetam (KEPPRA) 750 MG tablet Take 750 mg by mouth daily.  11/17/14 11/17/15  Historical Provider, MD  lithium carbonate 300 MG capsule Take 300 mg by mouth 2 (two) times daily with a meal.    Historical Provider, MD  Lurasidone HCl (LATUDA) 60 MG TABS Take 1 tablet by mouth every evening.    Historical Provider, MD  methocarbamol (ROBAXIN) 500 MG tablet Take 1 tablet (500 mg total) by mouth every 8 (eight) hours as needed for muscle spasms. 03/05/15   Tia Alertavid S Jones, MD  omeprazole (PRILOSEC) 40 MG capsule  Take 1 capsule (40 mg total) by mouth 2 (two) times daily as needed (Upset stomach). Patient not taking: Reported on 04/18/2015 12/10/12   Thermon Leyland, NP  oxyCODONE-acetaminophen (PERCOCET/ROXICET) 5-325 MG tablet Take 1-2 tablets by mouth every 4 (four) hours as needed for moderate pain. 03/05/15   Tia Alert, MD  traMADol (ULTRAM) 50 MG tablet Take 1 tablet (50 mg total) by mouth every 6 (six) hours as needed. 04/18/15   Gilda Crease, MD   BP 140/87 mmHg  Pulse 81  Temp(Src) 98.2 F (36.8 C) (Oral)  Resp 18  SpO2 100% Physical Exam  Constitutional: She is oriented to person,  place, and time. She appears well-developed and well-nourished.  Non-toxic appearance. No distress.  HENT:  Head: Normocephalic and atraumatic.  Eyes: Conjunctivae, EOM and lids are normal. Pupils are equal, round, and reactive to light.  Neck: Normal range of motion. Neck supple. No tracheal deviation present. No thyroid mass present.  Cardiovascular: Normal rate, regular rhythm and normal heart sounds.  Exam reveals no gallop.   No murmur heard. Pulmonary/Chest: Effort normal and breath sounds normal. No stridor. No respiratory distress. She has no decreased breath sounds. She has no wheezes. She has no rhonchi. She has no rales.  Abdominal: Soft. Normal appearance and bowel sounds are normal. She exhibits no distension. There is no tenderness. There is no rebound and no CVA tenderness.  Musculoskeletal: Normal range of motion. She exhibits no edema or tenderness.  1+ bilateral lower extremity edema  Neurological: She is alert and oriented to person, place, and time. She has normal strength. No cranial nerve deficit or sensory deficit. GCS eye subscore is 4. GCS verbal subscore is 5. GCS motor subscore is 6.  Skin: Skin is warm and dry. No abrasion and no rash noted.  Psychiatric: She has a normal mood and affect. Her speech is normal and behavior is normal.  Nursing note and vitals reviewed.   ED Course  Procedures (including critical care time) Labs Review Labs Reviewed  COMPREHENSIVE METABOLIC PANEL - Abnormal; Notable for the following:    Potassium 3.4 (*)    All other components within normal limits  CBC - Abnormal; Notable for the following:    RBC 3.79 (*)    Hemoglobin 11.8 (*)    HCT 35.6 (*)    All other components within normal limits  D-DIMER, QUANTITATIVE (NOT AT [facility])  Rosezena Sensor, ED    Imaging Review Dg Chest 2 View  04/23/2015  CLINICAL DATA:  Shortness of breath since last Friday. Bilateral lower extremity swelling. History of hypertension. EXAM: CHEST  2  VIEW COMPARISON:  04/17/2015; chest CT - 04/18/2015 FINDINGS: Grossly unchanged cardiac silhouette and mediastinal contours. Pacer leads overlie the bilateral lung apices. No focal airspace opacities. No pleural effusion or pneumothorax. No evidence of edema. Post cholecystectomy. Additional surgical clips overlie the midline of the upper abdomen. No acute osseus abnormalities. Post lower cervical ACDF, incompletely evaluated. IMPRESSION: No acute cardiopulmonary disease. Electronically Signed   By: Simonne Come M.D.   On: 04/23/2015 17:23   I have personally reviewed and evaluated these images and lab results as part of my medical decision-making.   EKG Interpretation None      MDM   Final diagnoses:  None    Patient's d-dimer negative. Does have some lower extremity edema was be treated with Lasix    Lorre Nick, MD 04/23/15 [id number]

## 2015-06-20 ENCOUNTER — Emergency Department (HOSPITAL_COMMUNITY): Payer: Medicare Other

## 2015-06-20 ENCOUNTER — Encounter (HOSPITAL_COMMUNITY): Payer: Self-pay | Admitting: Family Medicine

## 2015-06-20 ENCOUNTER — Emergency Department (HOSPITAL_COMMUNITY)
Admission: EM | Admit: 2015-06-20 | Discharge: 2015-06-20 | Disposition: A | Discharge status: Home / Self Care | Payer: Medicare Other | Attending: Emergency Medicine | Admitting: Emergency Medicine

## 2015-06-20 DIAGNOSIS — M1990 Unspecified osteoarthritis, unspecified site: Secondary | ICD-10-CM | POA: Insufficient documentation

## 2015-06-20 DIAGNOSIS — R112 Nausea with vomiting, unspecified: Secondary | ICD-10-CM | POA: Diagnosis not present

## 2015-06-20 DIAGNOSIS — R1084 Generalized abdominal pain: Secondary | ICD-10-CM | POA: Insufficient documentation

## 2015-06-20 DIAGNOSIS — G43909 Migraine, unspecified, not intractable, without status migrainosus: Secondary | ICD-10-CM | POA: Insufficient documentation

## 2015-06-20 DIAGNOSIS — Z87891 Personal history of nicotine dependence: Secondary | ICD-10-CM | POA: Diagnosis not present

## 2015-06-20 DIAGNOSIS — I10 Essential (primary) hypertension: Secondary | ICD-10-CM | POA: Diagnosis not present

## 2015-06-20 DIAGNOSIS — F319 Bipolar disorder, unspecified: Secondary | ICD-10-CM | POA: Insufficient documentation

## 2015-06-20 DIAGNOSIS — Z79899 Other long term (current) drug therapy: Secondary | ICD-10-CM | POA: Insufficient documentation

## 2015-06-20 DIAGNOSIS — Z862 Personal history of diseases of the blood and blood-forming organs and certain disorders involving the immune mechanism: Secondary | ICD-10-CM | POA: Diagnosis not present

## 2015-06-20 DIAGNOSIS — Z86711 Personal history of pulmonary embolism: Secondary | ICD-10-CM | POA: Diagnosis not present

## 2015-06-20 DIAGNOSIS — Z8719 Personal history of other diseases of the digestive system: Secondary | ICD-10-CM | POA: Diagnosis not present

## 2015-06-20 DIAGNOSIS — Z86718 Personal history of other venous thrombosis and embolism: Secondary | ICD-10-CM | POA: Diagnosis not present

## 2015-06-20 LAB — URINALYSIS, ROUTINE W REFLEX MICROSCOPIC
Bilirubin Urine: NEGATIVE
GLUCOSE, UA: NEGATIVE mg/dL
HGB URINE DIPSTICK: NEGATIVE
Ketones, ur: NEGATIVE mg/dL
Nitrite: NEGATIVE
PROTEIN: NEGATIVE mg/dL
SPECIFIC GRAVITY, URINE: 1.029 (ref 1.005–1.030)
pH: 5 (ref 5.0–8.0)

## 2015-06-20 LAB — CBC
HCT: 38.2 % (ref 36.0–46.0)
Hemoglobin: 12.7 g/dL (ref 12.0–15.0)
MCH: 30 pg (ref 26.0–34.0)
MCHC: 33.2 g/dL (ref 30.0–36.0)
MCV: 90.3 fL (ref 78.0–100.0)
PLATELETS: 273 10*3/uL (ref 150–400)
RBC: 4.23 MIL/uL (ref 3.87–5.11)
RDW: 11.9 % (ref 11.5–15.5)
WBC: 6.7 10*3/uL (ref 4.0–10.5)

## 2015-06-20 LAB — COMPREHENSIVE METABOLIC PANEL
ALBUMIN: 3.7 g/dL (ref 3.5–5.0)
ALT: 13 U/L — AB (ref 14–54)
AST: 18 U/L (ref 15–41)
Alkaline Phosphatase: 69 U/L (ref 38–126)
Anion gap: 9 (ref 5–15)
BUN: 10 mg/dL (ref 6–20)
CHLORIDE: 105 mmol/L (ref 101–111)
CO2: 26 mmol/L (ref 22–32)
CREATININE: 0.76 mg/dL (ref 0.44–1.00)
Calcium: 9.4 mg/dL (ref 8.9–10.3)
GFR calc non Af Amer: >60 mL/min (ref >60)
GLUCOSE: 108 mg/dL — AB (ref 65–99)
Potassium: 4.3 mmol/L (ref 3.5–5.1)
SODIUM: 140 mmol/L (ref 135–145)
Total Bilirubin: 0.6 mg/dL (ref 0.3–1.2)
Total Protein: 6.5 g/dL (ref 6.5–8.1)

## 2015-06-20 LAB — URINE MICROSCOPIC-ADD ON

## 2015-06-20 LAB — LIPASE, BLOOD: LIPASE: 22 U/L (ref 11–51)

## 2015-06-20 MED ORDER — SODIUM CHLORIDE 0.9 % IV BOLUS (SEPSIS)
1000.0000 mL | Freq: Once | INTRAVENOUS | Status: AC
  Administered 2015-06-20: 1000 mL via INTRAVENOUS

## 2015-06-20 MED ORDER — ONDANSETRON HCL 4 MG/2ML IJ SOLN
4.0000 mg | Freq: Once | INTRAMUSCULAR | Status: AC
  Administered 2015-06-20: 4 mg via INTRAVENOUS
  Filled 2015-06-20: qty 2

## 2015-06-20 MED ORDER — ONDANSETRON 4 MG PO TBDP: 4.0000 mg | ORAL_TABLET | Freq: Three times a day (TID) | ORAL | Status: DC | PRN

## 2015-06-20 MED ORDER — PROMETHAZINE HCL 25 MG PO TABS: 25.0000 mg | ORAL_TABLET | Freq: Four times a day (QID) | ORAL | Status: AC | PRN

## 2015-06-20 MED ORDER — SODIUM CHLORIDE 0.9 % IV SOLN: INTRAVENOUS | Status: DC

## 2015-06-20 MED ORDER — HYDROMORPHONE HCL 1 MG/ML IJ SOLN
1.0000 mg | Freq: Once | INTRAMUSCULAR | Status: AC
  Administered 2015-06-20: 1 mg via INTRAVENOUS
  Filled 2015-06-20: qty 1

## 2015-06-20 MED ORDER — HYDROCODONE-ACETAMINOPHEN 5-325 MG PO TABS: 1.0000 | ORAL_TABLET | Freq: Four times a day (QID) | ORAL | Status: AC | PRN

## 2015-06-20 MED ORDER — IOHEXOL 300 MG/ML  SOLN
100.0000 mL | Freq: Once | INTRAMUSCULAR | Status: AC | PRN
  Administered 2015-06-20: 100 mL via INTRAVENOUS

## 2015-06-20 NOTE — ED Notes (Signed)
Pt here for N,V and abd pain constant x 2 weeks.

## 2015-06-20 NOTE — ED Provider Notes (Signed)
CSN: [id number]     Arrival date & time 06/20/15  1338 History   First MD Initiated Contact with Patient 06/20/15 1529     Chief Complaint  Patient presents with  . Emesis  . Abdominal Pain     (Consider location/radiation/quality/duration/timing/severity/associated sxs/prior Treatment) Patient is a 45 y.o. female presenting with vomiting and abdominal pain. The history is provided by the patient.  Emesis Associated symptoms: no abdominal pain and no headaches   Abdominal Pain Associated symptoms: nausea and vomiting   Associated symptoms: no chest pain, no dysuria, no fever and no shortness of breath    patient with constant abdominal pain somewhat generalized for 2 weeks. Associated with nausea and vomiting. No diarrhea. No fevers. Patient states pain is 10 out of 10. No vomiting of blood. Pain is aching sharp in nature. Does not radiate to the back.  Past Medical History  Diagnosis Date  . Hypertension   . Migraine   . IBS (irritable bowel syndrome)   . Bipolar affective disorder (HCC)   . PTSD (post-traumatic stress disorder)   . Pulmonary embolism (HCC)   . DVT (deep venous thrombosis) (HCC)   . Bowel obstruction (HCC)   . Depression   . Anxiety   . GERD (gastroesophageal reflux disease)   . Arthritis   . Anemia    Past Surgical History  Procedure Laterality Date  . Abdominal surgery    . Abdominal hysterectomy    . Knee arthroscopy    . Appendectomy    . Cholecystectomy    . Tubal ligation    . Anterior cervical decomp/discectomy fusion N/A 03/04/2015    Procedure: ANTERIOR CERVICAL DECOMPRESSION/DISCECTOMY FUSION CERVICAL SIX-SEVEN;  Surgeon: Tia Alert, MD;  Location: [facility];  Service: Neurosurgery;  Laterality: N/A;   History reviewed. No pertinent family history. Social History  Substance Use Topics  . Smoking status: Former Smoker -- 0.50 packs/day for 10 years    Types: Cigarettes    Quit date: 04/19/2011  . Smokeless tobacco: None  . Alcohol  Use: Yes     Comment: drinks 1-2 times monthly   OB History    No data available     Review of Systems  Constitutional: Negative for fever.  HENT: Negative for congestion.   Eyes: Negative for visual disturbance.  Respiratory: Negative for shortness of breath.   Cardiovascular: Negative for chest pain.  Gastrointestinal: Positive for nausea and vomiting. Negative for abdominal pain.  Genitourinary: Negative for dysuria.  Musculoskeletal: Negative for back pain.  Skin: Negative for rash.  Neurological: Negative for headaches.  Hematological: Does not bruise/bleed easily.  Psychiatric/Behavioral: Negative for confusion.      Allergies  Benadryl; Buprenorphine hcl; Morphine and related; Lyrica; Neurontin; Aspirin; Sulfa antibiotics; and Ciprofloxacin hcl  Home Medications   Prior to Admission medications   Medication Sig Start Date End Date Taking? Authorizing Provider  amLODipine (NORVASC) 2.5 MG tablet Take 2.5 mg by mouth daily.  07/24/14  Yes Historical Provider, MD  furosemide (LASIX) 20 MG tablet Take 1 tablet (20 mg total) by mouth daily. 04/23/15  Yes Lorre Nick, MD  levETIRAcetam (KEPPRA) 750 MG tablet Take 750 mg by mouth 2 (two) times daily.  11/17/14 11/17/15 Yes Historical Provider, MD  oxyCODONE-acetaminophen (PERCOCET) 7.5-325 MG tablet Take 1 tablet by mouth every 4 (four) hours as needed for moderate pain or severe pain.   Yes Historical Provider, MD  HYDROcodone-acetaminophen (NORCO/VICODIN) 5-325 MG tablet Take 1-2 tablets by mouth every 6 (six)  hours as needed for moderate pain. 06/20/15   Vanetta Mulders, MD  methocarbamol (ROBAXIN) 500 MG tablet Take 1 tablet (500 mg total) by mouth every 8 (eight) hours as needed for muscle spasms. Patient not taking: Reported on 04/23/2015 03/05/15   Tia Alert, MD  omeprazole (PRILOSEC) 40 MG capsule Take 1 capsule (40 mg total) by mouth 2 (two) times daily as needed (Upset stomach). Patient not taking: Reported on 04/18/2015  12/10/12   Thermon Leyland, NP  ondansetron (ZOFRAN ODT) 4 MG disintegrating tablet Take 1 tablet (4 mg total) by mouth every 8 (eight) hours as needed for nausea or vomiting. 06/20/15   Vanetta Mulders, MD  oxyCODONE-acetaminophen (PERCOCET/ROXICET) 5-325 MG tablet Take 1-2 tablets by mouth every 4 (four) hours as needed for moderate pain. Patient not taking: Reported on 04/23/2015 03/05/15   Tia Alert, MD  promethazine (PHENERGAN) 25 MG tablet Take 1 tablet (25 mg total) by mouth every 6 (six) hours as needed for nausea or vomiting. 06/20/15   Vanetta Mulders, MD  traMADol (ULTRAM) 50 MG tablet Take 1 tablet (50 mg total) by mouth every 6 (six) hours as needed. Patient not taking: Reported on 06/20/2015 04/18/15   Gilda Crease, MD   BP 107/92 mmHg  Pulse 65  Temp(Src) 98.5 F (36.9 C) (Oral)  Resp 18  SpO2 100% Physical Exam  Constitutional: She is oriented to person, place, and time. She appears well-developed and well-nourished. No distress.  HENT:  Head: Normocephalic and atraumatic.  Mouth/Throat: Oropharynx is clear and moist.  Eyes: Conjunctivae and EOM are normal. Pupils are equal, round, and reactive to light.  Neck: Normal range of motion. Neck supple.  Cardiovascular: Normal rate, regular rhythm and normal heart sounds.   No murmur heard. Pulmonary/Chest: Effort normal and breath sounds normal. No respiratory distress.  Abdominal: Soft. Bowel sounds are normal. There is tenderness.  Mild diffuse tenderness no guarding.  Musculoskeletal: Normal range of motion.  Neurological: She is alert and oriented to person, place, and time. No cranial nerve deficit. She exhibits normal muscle tone. Coordination normal.  Skin: Skin is warm. No rash noted.  Nursing note and vitals reviewed.   ED Course  Procedures (including critical care time) Labs Review Labs Reviewed  COMPREHENSIVE METABOLIC PANEL - Abnormal; Notable for the following:    Glucose, Bld 108 (*)    ALT 13 (*)     All other components within normal limits  URINALYSIS, ROUTINE W REFLEX MICROSCOPIC (NOT AT [facility]) - Abnormal; Notable for the following:    Leukocytes, UA MODERATE (*)    All other components within normal limits  URINE MICROSCOPIC-ADD ON - Abnormal; Notable for the following:    Squamous Epithelial / LPF 6-30 (*)    Bacteria, UA FEW (*)    All other components within normal limits  URINE CULTURE  LIPASE, BLOOD  CBC   Results for orders placed or performed during the hospital encounter of 06/20/15  Lipase, blood  Result Value Ref Range   Lipase 22 11 - 51 U/L  Comprehensive metabolic panel  Result Value Ref Range   Sodium 140 135 - 145 mmol/L   Potassium 4.3 3.5 - 5.1 mmol/L   Chloride 105 101 - 111 mmol/L   CO2 26 22 - 32 mmol/L   Glucose, Bld 108 (H) 65 - 99 mg/dL   BUN 10 6 - 20 mg/dL   Creatinine, Ser 9.60 0.44 - 1.00 mg/dL   Calcium 9.4 8.9 - 45.4 mg/dL  Total Protein 6.5 6.5 - 8.1 g/dL   Albumin 3.7 3.5 - 5.0 g/dL   AST 18 15 - 41 U/L   ALT 13 (L) 14 - 54 U/L   Alkaline Phosphatase 69 38 - 126 U/L   Total Bilirubin 0.6 0.3 - 1.2 mg/dL   GFR calc non Af Amer >60 >60 mL/min   GFR calc Af Amer >60 >60 mL/min   Anion gap 9 5 - 15  CBC  Result Value Ref Range   WBC 6.7 4.0 - 10.5 K/uL   RBC 4.23 3.87 - 5.11 MIL/uL   Hemoglobin 12.7 12.0 - 15.0 g/dL   HCT 45.438.2 09.836.0 - 11.946.0 %   MCV 90.3 78.0 - 100.0 fL   MCH 30.0 26.0 - 34.0 pg   MCHC 33.2 30.0 - 36.0 g/dL   RDW 14.711.9 82.911.5 - 56.215.5 %   Platelets 273 150 - 400 K/uL  Urinalysis, Routine w reflex microscopic (not at [facility])  Result Value Ref Range   Color, Urine YELLOW YELLOW   APPearance CLEAR CLEAR   Specific Gravity, Urine 1.029 1.005 - 1.030   pH 5.0 5.0 - 8.0   Glucose, UA NEGATIVE NEGATIVE mg/dL   Hgb urine dipstick NEGATIVE NEGATIVE   Bilirubin Urine NEGATIVE NEGATIVE   Ketones, ur NEGATIVE NEGATIVE mg/dL   Protein, ur NEGATIVE NEGATIVE mg/dL   Nitrite NEGATIVE NEGATIVE   Leukocytes, UA MODERATE (A) NEGATIVE   Urine microscopic-add on  Result Value Ref Range   Squamous Epithelial / LPF 6-30 (A) NONE SEEN   WBC, UA 6-30 0 - 5 WBC/hpf   RBC / HPF 0-5 0 - 5 RBC/hpf   Bacteria, UA FEW (A) NONE SEEN     Imaging Review Ct Abdomen Pelvis W Contrast  06/20/2015  CLINICAL DATA:  Abdominal pain with nausea and vomiting for several weeks. EXAM: CT ABDOMEN AND PELVIS WITH CONTRAST TECHNIQUE: Multidetector CT imaging of the abdomen and pelvis was performed using the standard protocol following bolus administration of intravenous contrast. CONTRAST:  100mL OMNIPAQUE IOHEXOL 300 MG/ML  SOLN COMPARISON:  09/16/2012 FINDINGS: Lower chest:  No acute findings. Hepatobiliary: Mild hepatic steatosis again noted. No liver masses are identified. Prior cholecystectomy noted. No evidence of biliary dilatation. Pancreas: No mass, inflammatory changes, or other significant abnormality. Spleen: Within normal limits in size and appearance. Adrenals/Urinary Tract: No masses identified. No evidence of hydronephrosis. Stomach/Bowel: No evidence of obstruction, inflammatory process, or abnormal fluid collections. Vascular/Lymphatic: No pathologically enlarged lymph nodes. No evidence of abdominal aortic aneurysm. Reproductive: Prior hysterectomy noted. Adnexal regions are unremarkable in appearance. Other: None. Musculoskeletal:  No suspicious bone lesions identified. IMPRESSION: Stable mild hepatic steatosis. No acute findings or other significant abnormality identified within the abdomen or pelvis. Electronically Signed   By: Myles RosenthalJohn  Stahl M.D.   On: 06/20/2015 19:23   I have personally reviewed and evaluated these images and lab results as part of my medical decision-making.   EKG Interpretation None      MDM   Final diagnoses:  Generalized abdominal pain    Workup for the abdominal pain nausea and vomiting without any acute findings. No significant electrolyte abnormalities. No evidence of urinary tract infection. No  leukocytosis no anemia. No electrolyte abnormalities of any significance to include the liver function tests.  Patient's had history of episodes of abdominal pain in the past without specific findings. Will treat patient symptomatically never follow-up with her primary care doctor.    Vanetta MuldersScott Yanelis Osika, MD 06/20/15 2208

## 2015-06-20 NOTE — ED Notes (Signed)
Patient left at this time with all belongings. 

## 2015-06-20 NOTE — ED Notes (Signed)
Pt changing into gown.  Reports emesis and generalized abdominal pain x 2 weeks.

## 2015-06-20 NOTE — Discharge Instructions (Signed)
Workup for the abdominal pain without any acute findings. Take pain medicine as needed. Take the Phenergan and/or Zofran as needed to control nausea and vomiting. Make appointment to follow-up with your regular doctor.

## 2015-06-20 NOTE — ED Notes (Signed)
MD at bedside. 

## 2015-06-22 LAB — URINE CULTURE

## 2015-08-03 ENCOUNTER — Ambulatory Visit: Payer: Medicare Other | Admitting: Physical Therapy

## 2015-08-04 ENCOUNTER — Ambulatory Visit: Payer: Medicare Other | Attending: Family Medicine | Admitting: Physical Therapy

## 2015-08-04 DIAGNOSIS — R293 Abnormal posture: Secondary | ICD-10-CM | POA: Diagnosis present

## 2015-08-04 DIAGNOSIS — R252 Cramp and spasm: Secondary | ICD-10-CM | POA: Diagnosis present

## 2015-08-04 DIAGNOSIS — M5412 Radiculopathy, cervical region: Secondary | ICD-10-CM | POA: Diagnosis present

## 2015-08-04 DIAGNOSIS — M25511 Pain in right shoulder: Secondary | ICD-10-CM | POA: Diagnosis present

## 2015-08-04 DIAGNOSIS — M6281 Muscle weakness (generalized): Secondary | ICD-10-CM | POA: Insufficient documentation

## 2015-08-04 NOTE — Therapy (Signed)
[facility] [address] [place], [place], [address] Phone: [phone]   Fax:  [phone]  Physical Therapy Evaluation  Patient Details  Name: Karen [name] MRN: [id number] Date of Birth: 08/28/1970 Referring Provider: Marikay Alar MD  Encounter Date: 08/04/2015      PT End of Session - 08/04/15 1700    Visit Number 1   Number of Visits 13   Date for PT Re-Evaluation 09/15/15   Authorization Type Medicare: kx mod by 15 th visit, progress note by 10 visit.    Activity Tolerance Patient tolerated treatment well   Behavior During Therapy [facility] for tasks assessed/performed      Past Medical History  Diagnosis Date  . Hypertension   . Migraine   . IBS (irritable bowel syndrome)   . Bipolar affective disorder (HCC)   . PTSD (post-traumatic stress disorder)   . Pulmonary embolism (HCC)   . DVT (deep venous thrombosis) (HCC)   . Bowel obstruction (HCC)   . Depression   . Anxiety   . GERD (gastroesophageal reflux disease)   . Arthritis   . Anemia     Past Surgical History  Procedure Laterality Date  . Abdominal surgery    . Abdominal hysterectomy    . Knee arthroscopy    . Appendectomy    . Cholecystectomy    . Tubal ligation    . Anterior cervical decomp/discectomy fusion N/A 03/04/2015    Procedure: ANTERIOR CERVICAL DECOMPRESSION/DISCECTOMY FUSION CERVICAL SIX-SEVEN;  Surgeon: Tia Alert, MD;  Location: [facility];  Service: Neurosurgery;  Laterality: N/A;    There were no vitals filed for this visit.       Subjective Assessment - 08/04/15 1506    Subjective pt is a 45 y.o with CC of neck pain with referral of pain down the R arm for the last 8 months. She reported having a cervical fusion at C6-C7 on 03/03/2016 and she has been having pain in the neck/ shoulder since the surgery. pain goes to the top of the R hsoulder to the deltoid that seems to be constant, she reports it fluctuates since onset.    Limitations Lifting;Sitting;Walking;House hold activities;Standing  able to write 10 minutes   How long can you sit comfortably? 30 min   How long can you stand comfortably? 30 - 45 min   How long can you walk comfortably? 30 min   Diagnostic tests 06/20/2015 x-ray per pt report everything looked    Patient Stated Goals to decrease pain to feel normal, to be able to get back to writing without pain    Currently in Pain? Yes   Pain Score 8   took pain meds @ 8 am   Pain Location Neck   Pain Orientation Right   Pain Descriptors / Indicators Aching;Sharp;Shooting;Tightness;Sore;Burning;Throbbing   Pain Type Chronic pain   Pain Radiating Towards r uppper shoulder   Pain Onset More than a month ago   Pain Frequency Constant   Aggravating Factors  unknown, moving the wrong way but unkown which way it causes it   Pain Relieving Factors pain medication, ice pack, heating pad, some exercise            [facility] PT Assessment - 08/04/15 1454    Assessment   Medical Diagnosis Cervicalaliga with radiculopathy   Referring Provider Marikay Alar MD   Onset Date/Surgical Date --  8 months   Hand Dominance Right   Next MD Visit 09/21/2014   Prior Therapy  yes   Precautions   Precautions None   Restrictions   Weight Bearing Restrictions No   Balance Screen   Has the patient fallen in the past 6 months Yes   How many times? 1   Has the patient had a decrease in activity level because of a fear of falling?  No   Is the patient reluctant to leave their home because of a fear of falling?  No   Home Environment   Living Environment Private residence   Living Arrangements Alone   Type of Home Apartment   Home Access Stairs to enter   Entrance Stairs-Number of Steps 14   Entrance Stairs-Rails Right   Home Layout One level   Prior Function   Level of Independence Independent;Independent with basic ADLs   Vocation On disability   Leisure reading, walking    Cognition   Overall Cognitive Status  Within Functional Limits for tasks assessed   Observation/Other Assessments   Focus on Therapeutic Outcomes (FOTO)  59% limited  predicted 50% limited   Posture/Postural Control   Posture/Postural Control Postural limitations   Postural Limitations Rounded Shoulders;Forward head;Increased thoracic kyphosis   ROM / Strength   AROM / PROM / Strength AROM;PROM;Strength   AROM   AROM Assessment Site Cervical   Cervical Flexion 30  ERP   Cervical Extension 40   Cervical - Right Side Bend 22  ERP   Cervical - Left Side Bend 30   Cervical - Right Rotation 45  ERP   Cervical - Left Rotation 45   Strength   Strength Assessment Site Shoulder   Right/Left Shoulder Right;Left   Right Shoulder Flexion 3+/5   Right Shoulder Extension 4+/5   Right Shoulder ABduction 3+/5   Right Shoulder Internal Rotation 3+/5   Right Shoulder External Rotation 4/5   Left Shoulder Flexion 4/5   Left Shoulder Extension 4+/5   Left Shoulder ABduction 4/5   Left Shoulder Internal Rotation 4/5   Left Shoulder External Rotation 4/5   Palpation   Palpation comment signficant tightness at the R upper trap and levator scapulae   Special Tests    Special Tests Cervical   Cervical Tests Spurling's;Dictraction;other   Spurling's   Findings Not Tested   Distraction Test   Findngs not tested   other    Findings Not tested   Comment ULTT                   [facility] Adult PT Treatment/Exercise - 08/04/15 1454    Self-Care   Self-Care Posture   Posture focusing on keeping the shoulders down to avoid over activation the upper trap    Shoulder Exercises: Stretch   Other Shoulder Stretches upper trap stretch 2 x 20  educated to hold 30 sec but pt reported she couldn't do it   Other Shoulder Stretches levator scapulae stretch 2 x 30 sec hold  cues to go to where she only feels a stretch   Manual Therapy   Manual therapy comments trial of manual trigger point release 1 x 60   pt did not tolerated very  well                PT Education - 08/04/15 1659    Education provided Yes   Education Details evaluation findings, goals, HEP, POC, posture education regarding keeping the shoulder down to avoid over activation of the upper trap.    Person(s) Educated Patient   Methods Explanation   Comprehension Verbalized  understanding          PT Short Term Goals - 08/04/15 1756    PT SHORT TERM GOAL #1   Title pt will be I with inital HEP (08/25/2015)   Time 3   Period Weeks   Status New   PT SHORT TERM GOAL #2   Title pt will be able to demo proper posture and lifting mechanics  to avoid over activation of the R shoulder muscualture to prevent pain and reinjury (08/25/2015)   Time 3   Period Weeks   Status New           PT Long Term Goals - 08/04/15 1757    PT LONG TERM GOAL #1   Title pt will be I with all HEP as of last visit (09/15/2015)   Time 6   Period Weeks   Status New   PT LONG TERM GOAL #2   Title pt will improve R rotation and L side bending by >/= 5 degrees with </=5/10 pain to assist with safety during driving (0/98/1191)   Time 6   Period Weeks   Status New   PT LONG TERM GOAL #3   Title pt will improve R shoulder strength to >/= 4-/5 with </= 5/10 pain during testing to promote functional lifting and carrying required for ADLS (09/15/2015)   Time 6   Period Weeks   Status New   PT LONG TERM GOAL #4   Title pt will be able to sit and write for >/= 30 minutes with </= 3/10 pain to assist with personal goal of getting back to writing (09/15/2015)   Time 6   Period Weeks   Status New   PT LONG TERM GOAL #5   Title pt will improve her FOTO score to >/= 50 to demonstrate improvement in function at discharge (09/15/2015)   Time 6   Period Weeks   Status New               Plan - 08/04/15 1744    Clinical Impression Statement Mrs. Boza presens to OPPT as a moderate complexity evaluation with cervical pain with referral to the R shoulder that has been  going on for 8 months since she had a cerivcal fusion at C6-C7. She is accompanied with a case worker today. She demonstrates limited cervical mobility specifically with ERP flexion, R rotation and L side bending. MMT revealed weakness of the R shoulder compared bil with pt demonstrating limited tolerance for pain pushing therapist hands away multiple times during testing. signicant tightness upon palpation at the R upper trap / levator scapulae. Attempted trial of manual trigger point  release with 3-5# of pressure which pt did not toleate well. she would benefit from physical therapy to improve cervical mobiltiy, shoulder strength, decrease R shoulder spasm, improve posture and maximize function by addressing the impairments listed.    Rehab Potential Fair   Clinical Impairments Affecting Rehab Potential Hx of physchological impairments   PT Frequency 2x / week   PT Duration 6 weeks   PT Treatment/Interventions ADLs/Self Care Home Management;Cryotherapy;Electrical Stimulation;Iontophoresis /ml Dexamethasone;Moist Heat;Therapeutic exercise;Manual techniques;Dry needling;Passive range of motion;Ultrasound;Therapeutic activities;Taping;Patient/family education   PT Next Visit Plan assess/ review HEP, DN education and trial of DN, manual of r shoulder musculature, modalities PRN   PT Home Exercise Plan upper trap/ levator scapulae stretching, chin tuck, rows   Consulted and Agree with Plan of Care Patient      Patient will benefit from skilled therapeutic intervention  in order to improve the following deficits and impairments:  Improper body mechanics, Postural dysfunction, Pain, Decreased strength, Increased muscle spasms, Hypomobility, Decreased endurance, Decreased range of motion, Impaired UE functional use, Increased fascial restricitons, Decreased activity tolerance  Visit Diagnosis: Pain in right shoulder - Plan: PT plan of care cert/re-cert  Radiculopathy, cervical region - Plan: PT plan of  care cert/re-cert  Cramp and spasm - Plan: PT plan of care cert/re-cert  Muscle weakness (generalized) - Plan: PT plan of care cert/re-cert  Abnormal posture - Plan: PT plan of care cert/re-cert      G-Codes - 08/04/15 1802    Functional Assessment Tool Used FOTO/ Clinical judgement   Functional Limitation Changing and maintaining body position   Changing and Maintaining Body Position Current Status ([id number]) At least 60 percent but less than 80 percent impaired, limited or restricted   Changing and Maintaining Body Position Goal Status (U0454(G8982) At least 40 percent but less than 60 percent impaired, limited or restricted       Problem List Patient Active Problem List   Diagnosis Date Noted  . S/P cervical spinal fusion 03/04/2015  . Obstructive apnea 12/08/2014  . 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 Tingling 08/19/2014  . Limb weakness 08/19/2014  . Muscle weakness (generalized) 08/19/2014  . Muscle ache 07/25/2014  . Arthralgia of multiple joints 07/24/2014  . Encounter for general adult medical examination without abnormal findings 07/24/2014  . Migraine without aura and responsive to treatment 06/09/2014  . Abdominal pain, generalized 01/31/2014  . Chronic headache 01/30/2014  . Cannot sleep 06/24/2013  . 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 Essential (primary) hypertension 05/13/2013  . Digestive disorder 05/13/2013  . H/O injury, presenting hazards to health 05/13/2013  . Bipolar affective disorder (HCC)   . Bipolar I disorder, most recent episode depressed (HCC) 11/15/2012  . PTSD (post-traumatic stress disorder) 11/15/2012   Lulu RidingKristoffer Brandonn Capelli PT, DPT, LAT, ATC  08/04/2015  6:07 PM      [facility] [address] [place], [place], [address] Phone: [phone]   Fax:  [phone]  Name: Karen [name] MRN: [id number] Date of Birth: 03/24/1971

## 2015-08-11 ENCOUNTER — Ambulatory Visit: Payer: Medicare Other | Admitting: Physical Therapy

## 2015-08-11 DIAGNOSIS — R293 Abnormal posture: Secondary | ICD-10-CM

## 2015-08-11 DIAGNOSIS — M5412 Radiculopathy, cervical region: Secondary | ICD-10-CM

## 2015-08-11 DIAGNOSIS — M25511 Pain in right shoulder: Secondary | ICD-10-CM | POA: Diagnosis not present

## 2015-08-11 DIAGNOSIS — M6281 Muscle weakness (generalized): Secondary | ICD-10-CM

## 2015-08-11 DIAGNOSIS — R252 Cramp and spasm: Secondary | ICD-10-CM

## 2015-08-11 NOTE — Therapy (Addendum)
[facility] [address] [place], [place], [address] Phone: [phone]   Fax:  [phone]  Physical Therapy Treatment  Patient Details  Name: Karen [name] MRN: [id number] Date of Birth: [birth year] Referring Provider: Marikay Alar MD  Encounter Date: 08/11/2015      PT End of Session - 08/11/15 0824    Visit Number 2   Number of Visits 13   Date for PT Re-Evaluation 09/15/15   PT Start Time 0815  pt arrived 15 minutes late today   PT Stop Time 0849   PT Time Calculation (min) 34 min   Activity Tolerance Patient tolerated treatment well   Behavior During Therapy [facility] for tasks assessed/performed      Past Medical History  Diagnosis Date  . Hypertension   . Migraine   . IBS (irritable bowel syndrome)   . Bipolar affective disorder (HCC)   . PTSD (post-traumatic stress disorder)   . Pulmonary embolism (HCC)   . DVT (deep venous thrombosis) (HCC)   . Bowel obstruction (HCC)   . Depression   . Anxiety   . GERD (gastroesophageal reflux disease)   . Arthritis   . Anemia     Past Surgical History  Procedure Laterality Date  . Abdominal surgery    . Abdominal hysterectomy    . Knee arthroscopy    . Appendectomy    . Cholecystectomy    . Tubal ligation    . Anterior cervical decomp/discectomy fusion N/A 03/04/2015    Procedure: ANTERIOR CERVICAL DECOMPRESSION/DISCECTOMY FUSION CERVICAL SIX-SEVEN;  Surgeon: Tia Alert, MD;  Location: [facility];  Service: Neurosurgery;  Laterality: N/A;    There were no vitals filed for this visit.      Subjective Assessment - 08/11/15 0818    Subjective pt reports being somewhat consistent with her HEP, soreness being off and on.    Currently in Pain? Yes   Pain Score 8    Pain Location Neck   Pain Orientation Right   Pain Descriptors / Indicators Aching;Sore;Tightness  pulling   Pain Type Chronic pain   Pain Onset More than a month ago   Pain Frequency Constant   Aggravating Factors  unknown   Pain Relieving Factors pain medication, ice pack, heating pad, some exercise                         [facility] Adult PT Treatment/Exercise - 08/11/15 0001    Shoulder Exercises: Stretch   Other Shoulder Stretches upper trap stretch 2 x 20   Other Shoulder Stretches levator scapulae stretch 2 x 30 sec hold   Modalities   Modalities Electrical Stimulation;Moist Heat   Moist Heat Therapy   Number Minutes Moist Heat 10 Minutes   Moist Heat Location Shoulder  R upper trap   Electrical Stimulation   Electrical Stimulation Location R upper trap   Electrical Stimulation Action IFC   Electrical Stimulation Parameters L 6 x 10 min, scan 100%   Electrical Stimulation Goals Pain   Manual Therapy   Manual Therapy Soft tissue mobilization   Soft tissue mobilization IASTM of the L upper trap/ levator scap          Trigger Point Dry Needling - 08/11/15 0845    Consent Given? Yes   Education Handout Provided Yes   Muscles Treated Upper Body Upper trapezius   Upper Trapezius Response Twitch reponse elicited;Palpable increased muscle length  PT Education - 08/11/15 0848    Education provided Yes   Education Details dry needling education   Person(s) Educated Patient   Methods Explanation   Comprehension Verbalized understanding          PT Short Term Goals - 08/04/15 1756    PT SHORT TERM GOAL #1   Title pt will be I with inital HEP (08/25/2015)   Time 3   Period Weeks   Status New   PT SHORT TERM GOAL #2   Title pt will be able to demo proper posture and lifting mechanics  to avoid over activation of the R shoulder muscualture to prevent pain and reinjury (08/25/2015)   Time 3   Period Weeks   Status New           PT Long Term Goals - 08/04/15 1757    PT LONG TERM GOAL #1   Title pt will be I with all HEP as of last visit (09/15/2015)   Time 6   Period Weeks   Status New   PT LONG TERM GOAL #2   Title pt will  improve R rotation and L side bending by >/= 5 degrees with </=5/10 pain to assist with safety during driving (1/91/47825/30/2017)   Time 6   Period Weeks   Status New   PT LONG TERM GOAL #3   Title pt will improve R shoulder strength to >/= 4-/5 with </= 5/10 pain during testing to promote functional lifting and carrying required for ADLS (09/15/2015)   Time 6   Period Weeks   Status New   PT LONG TERM GOAL #4   Title pt will be able to sit and write for >/= 30 minutes with </= 3/10 pain to assist with personal goal of getting back to writing (09/15/2015)   Time 6   Period Weeks   Status New   PT LONG TERM GOAL #5   Title pt will improve her FOTO score to >/= 50 to demonstrate improvement in function at discharge (09/15/2015)   Time 6   Period Weeks   Status New               Plan - 08/11/15 [id number]    Clinical Impression Statement [name] reports being somewhat consistent with her HEP. she provided consent for dry needling of the  R upper trap, pt was monitored closely during treatment. Following IASTM was peroformed and stretching which she reported relief of tightness and she was able to rotatie her head further to the R per pt report. utilied MHP with E-stim post session for soreness from todays session.    PT Next Visit Plan assess response to  DN education and trial of DN, manual of r shoulder musculature, modalities PRN   PT Home Exercise Plan HEP review   Consulted and Agree with Plan of Care Patient      Patient will benefit from skilled therapeutic intervention in order to improve the following deficits and impairments:    Improper body mechanics, Postural dysfunction, Pain, Decreased strength, Increased muscle spasms, Hypomobility, Decreased endurance, Decreased range of motion, Impaired UE functional use, Increased fascial restricitons, Decreased activity tolerance  Visit Diagnosis: Pain in right shoulder  Radiculopathy, cervical region  Cramp and spasm  Muscle weakness  (generalized)  Abnormal posture     Problem List Patient Active Problem List   Diagnosis Date Noted  . S/P cervical spinal fusion 03/04/2015  . Obstructive apnea 12/08/2014  . Cervical nerve root disorder  08/19/2014  . Tingling 08/19/2014  . Limb weakness 08/19/2014  . Muscle weakness (generalized) 08/19/2014  . Muscle ache 07/25/2014  . Arthralgia of multiple joints 07/24/2014  . Encounter for general adult medical examination without abnormal findings 07/24/2014  . Migraine without aura and responsive to treatment 06/09/2014  . Abdominal pain, generalized 01/31/2014  . Chronic headache 01/30/2014  . Cannot sleep 06/24/2013  . 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 Essential (primary) hypertension 05/13/2013  . Digestive disorder 05/13/2013  . H/O injury, presenting hazards to health 05/13/2013  . Bipolar affective disorder (HCC)   . Bipolar I disorder, most recent episode depressed (HCC) 11/15/2012  . PTSD (post-traumatic stress disorder) 11/15/2012    Lulu Riding PT, DPT, LAT, ATC  08/11/2015  8:53 AM      [facility] [address] [place], [place], [address] Phone: [phone]   Fax:  [phone]  Name: Karen [name] MRN: [id number] Date of Birth: 08/18/1970

## 2015-08-14 ENCOUNTER — Ambulatory Visit: Payer: Medicare Other | Admitting: Physical Therapy

## 2015-08-14 DIAGNOSIS — M5412 Radiculopathy, cervical region: Secondary | ICD-10-CM

## 2015-08-14 DIAGNOSIS — R252 Cramp and spasm: Secondary | ICD-10-CM

## 2015-08-14 DIAGNOSIS — M6281 Muscle weakness (generalized): Secondary | ICD-10-CM

## 2015-08-14 DIAGNOSIS — R293 Abnormal posture: Secondary | ICD-10-CM

## 2015-08-14 DIAGNOSIS — M25511 Pain in right shoulder: Secondary | ICD-10-CM | POA: Diagnosis not present

## 2015-08-14 NOTE — Therapy (Signed)
[facility] [address] [place], [place], [address] Phone: [phone]   Fax:  [phone]  Physical Therapy Treatment  Patient Details  Name: Karen [name] MRN: [id number] Date of Birth: 04-11-1971 Referring Provider: Marikay Alar MD  Encounter Date: 08/14/2015      PT End of Session - 08/14/15 1159    Visit Number 3   Number of Visits 13   Date for PT Re-Evaluation 09/15/15   Authorization Type Medicare: kx mod by 15 th visit, progress note by 10 visit.    PT Start Time 1015   PT Stop Time 1108   PT Time Calculation (min) 53 min   Activity Tolerance Patient tolerated treatment well   Behavior During Therapy WFL for tasks assessed/performed      Past Medical History  Diagnosis Date  . Hypertension   . Migraine   . IBS (irritable bowel syndrome)   . Bipolar affective disorder (HCC)   . PTSD (post-traumatic stress disorder)   . Pulmonary embolism (HCC)   . DVT (deep venous thrombosis) (HCC)   . Bowel obstruction (HCC)   . Depression   . Anxiety   . GERD (gastroesophageal reflux disease)   . Arthritis   . Anemia     Past Surgical History  Procedure Laterality Date  . Abdominal surgery    . Abdominal hysterectomy    . Knee arthroscopy    . Appendectomy    . Cholecystectomy    . Tubal ligation    . Anterior cervical decomp/discectomy fusion N/A 03/04/2015    Procedure: ANTERIOR CERVICAL DECOMPRESSION/DISCECTOMY FUSION CERVICAL SIX-SEVEN;  Surgeon: Tia Alert, MD;  Location: [facility];  Service: Neurosurgery;  Laterality: N/A;    There were no vitals filed for this visit.      Subjective Assessment - 08/14/15 1016    Subjective "pt reports doing a little better today, the dry needling helped last time"    Currently in Pain? Yes   Pain Score 5    Pain Location Neck   Pain Orientation Right   Pain Descriptors / Indicators Aching;Sore   Pain Type Chronic pain   Pain Onset More than a month ago   Pain  Frequency Constant   Aggravating Factors  unknown                         [facility] Adult PT Treatment/Exercise - 08/14/15 1157    Moist Heat Therapy   Number Minutes Moist Heat 10 Minutes   Moist Heat Location Shoulder   Electrical Stimulation   Electrical Stimulation Location R upper trap   Electrical Stimulation Action IFC   Electrical Stimulation Parameters 100% scan, L15 x 10 min   Electrical Stimulation Goals Pain   Manual Therapy   Manual Therapy Joint mobilization   Joint Mobilization Grade 1-2 T1-T6 Mobs P>A   Soft tissue mobilization IASTM of the L upper trap/ levator scap          Trigger Point Dry Needling - 08/14/15 1156    Consent Given? Yes   Education Handout Provided No  given previously   Muscles Treated Upper Body Upper trapezius   Upper Trapezius Response Twitch reponse elicited;Palpable increased muscle length              PT Education - 08/14/15 1159    Education provided Yes   Education Details E-stim education and where to find a TENS unit.    Person(s) Educated Patient  Methods Explanation   Comprehension Verbalized understanding          PT Short Term Goals - 08/14/15 1201    PT SHORT TERM GOAL #1   Title pt will be I with inital HEP (08/25/2015)   Time 3   Period Weeks   Status On-going   PT SHORT TERM GOAL #2   Title pt will be able to demo proper posture and lifting mechanics  to avoid over activation of the R shoulder muscualture to prevent pain and reinjury (08/25/2015)   Time 3   Period Weeks   Status On-going           PT Long Term Goals - 08/04/15 1757    PT LONG TERM GOAL #1   Title pt will be I with all HEP as of last visit (09/15/2015)   Time 6   Period Weeks   Status New   PT LONG TERM GOAL #2   Title pt will improve R rotation and L side bending by >/= 5 degrees with </=5/10 pain to assist with safety during driving (1/61/09605/30/2017)   Time 6   Period Weeks   Status New   PT LONG TERM GOAL #3    Title pt will improve R shoulder strength to >/= 4-/5 with </= 5/10 pain during testing to promote functional lifting and carrying required for ADLS (09/15/2015)   Time 6   Period Weeks   Status New   PT LONG TERM GOAL #4   Title pt will be able to sit and write for >/= 30 minutes with </= 3/10 pain to assist with personal goal of getting back to writing (09/15/2015)   Time 6   Period Weeks   Status New   PT LONG TERM GOAL #5   Title pt will improve her FOTO score to >/= 50 to demonstrate improvement in function at discharge (09/15/2015)   Time 6   Period Weeks   Status New               Plan - 08/14/15 1159    Clinical Impression Statement Mrs. Karen [name] reports she is doing better since the last session. performed another round of DN of the R upper trap.  Following DN performed IASTM and thoracic joint mobs to relieve pain and promote mobility which she reported some relief of soreness. Utilized MHP and E-stim post session for DN soreness.   PT Next Visit Plan assess response to  DN education and trial of DN, manual of r shoulder musculature, modalities PRN   Consulted and Agree with Plan of Care Patient      Patient will benefit from skilled therapeutic intervention in order to improve the following deficits and impairments:  Improper body mechanics, Postural dysfunction, Pain, Decreased strength, Increased muscle spasms, Hypomobility, Decreased endurance, Decreased range of motion, Impaired UE functional use, Increased fascial restricitons, Decreased activity tolerance  Visit Diagnosis: Pain in right shoulder  Radiculopathy, cervical region  Cramp and spasm  Muscle weakness (generalized)  Abnormal posture     Problem List Patient Active Problem List   Diagnosis Date Noted  . S/P cervical spinal fusion 03/04/2015  . Obstructive apnea 12/08/2014  . 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 Tingling 08/19/2014  . Limb weakness 08/19/2014  . Muscle weakness  (generalized) 08/19/2014  . Muscle ache 07/25/2014  . Arthralgia of multiple joints 07/24/2014  . Encounter for general adult medical examination without abnormal findings 07/24/2014  . Migraine without aura and responsive to treatment  06/09/2014  . Abdominal pain, generalized 01/31/2014  . Chronic headache 01/30/2014  . Cannot sleep 06/24/2013  . 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 Essential (primary) hypertension 05/13/2013  . Digestive disorder 05/13/2013  . H/O injury, presenting hazards to health 05/13/2013  . Bipolar affective disorder (HCC)   . Bipolar I disorder, most recent episode depressed (HCC) 11/15/2012  . PTSD (post-traumatic stress disorder) 11/15/2012   Karen [name] PT, DPT, LAT, ATC  08/14/2015  12:03 PM      [facility] [address] [place], [place], [address] Phone: [phone]   Fax:  [phone]  Name: Karen [name] MRN: [id number] Date of Birth: 06-21-1970

## 2015-08-18 ENCOUNTER — Ambulatory Visit: Payer: Medicare Other | Attending: Family Medicine | Admitting: Physical Therapy

## 2015-08-18 DIAGNOSIS — R252 Cramp and spasm: Secondary | ICD-10-CM | POA: Insufficient documentation

## 2015-08-18 DIAGNOSIS — M25511 Pain in right shoulder: Secondary | ICD-10-CM | POA: Diagnosis present

## 2015-08-18 DIAGNOSIS — R293 Abnormal posture: Secondary | ICD-10-CM | POA: Diagnosis present

## 2015-08-18 DIAGNOSIS — M6281 Muscle weakness (generalized): Secondary | ICD-10-CM | POA: Diagnosis present

## 2015-08-18 DIAGNOSIS — M5412 Radiculopathy, cervical region: Secondary | ICD-10-CM | POA: Insufficient documentation

## 2015-08-18 NOTE — Therapy (Addendum)
[facility] [address] [place], [place], [address] Phone: [phone]   Fax:  [phone]  Physical Therapy Treatment  Patient Details  Name: Karen [name] MRN: [id number] Date of Birth: 07/29/1970 Referring Provider: Marikay Alaravid Jones MD  Encounter Date: 08/18/2015      PT End of Session - 08/18/15 1300    Visit Number 4   Number of Visits 13   Date for PT Re-Evaluation 09/15/15   Authorization Type Medicare: kx mod by 15 th visit, progress note by 10 visit.    PT Start Time 1100   PT Stop Time 1148   PT Time Calculation (min) 48 min   Activity Tolerance Patient tolerated treatment well   Behavior During Therapy WFL for tasks assessed/performed      Past Medical History  Diagnosis Date  . Hypertension   . Migraine   . IBS (irritable bowel syndrome)   . Bipolar affective disorder (HCC)   . PTSD (post-traumatic stress disorder)   . Pulmonary embolism (HCC)   . DVT (deep venous thrombosis) (HCC)   . Bowel obstruction (HCC)   . Depression   . Anxiety   . GERD (gastroesophageal reflux disease)   . Arthritis   . Anemia     Past Surgical History  Procedure Laterality Date  . Abdominal surgery    . Abdominal hysterectomy    . Knee arthroscopy    . Appendectomy    . Cholecystectomy    . Tubal ligation    . Anterior cervical decomp/discectomy fusion N/A 03/04/2015    Procedure: ANTERIOR CERVICAL DECOMPRESSION/DISCECTOMY FUSION CERVICAL SIX-SEVEN;  Surgeon: Tia Alertavid S Jones, MD;  Location: [facility];  Service: Neurosurgery;  Laterality: N/A;    There were no vitals filed for this visit.      Subjective Assessment - 08/18/15 1112    Subjective pt reports having increased pain today following doing her hair"   Currently in Pain? Yes   Pain Score 7    Pain Location Neck   Pain Orientation Right   Pain Descriptors / Indicators Aching;Sore   Pain Type Chronic pain   Pain Onset More than a month ago   Pain Frequency  Constant   Aggravating Factors  doing the hair,    Pain Relieving Factors pain medicatoin, ice pack, heting pad,                          [facility] Adult PT Treatment/Exercise - 08/18/15 0001    Self-Care   Posture reinforced posture and focus on avoiding shoulder hiking   Shoulder Exercises: Stretch   Other Shoulder Stretches upper trap stretch 2 x 20   Other Shoulder Stretches levator scapulae stretch 2 x 30 sec hold   Moist Heat Therapy   Number Minutes Moist Heat 10 Minutes   Moist Heat Location Shoulder   Electrical Stimulation   Electrical Stimulation Location R upper trap   Electrical Stimulation Action IFC   Electrical Stimulation Parameters 100% scan, L14, x 10 min   Electrical Stimulation Goals Pain   Manual Therapy   Manual Therapy Taping   McConnell upper trap inhibition taping using one "I" strip/ one  "X" strips                PT Education - 08/18/15 1300    Education provided Yes   Education Details chronic pain education, upper trap inhibitoin taping education/ benefits   Person(s) Educated Patient;Other (comment)   Methods  Explanation   Comprehension Verbalized understanding          PT Short Term Goals - 08/14/15 1201    PT SHORT TERM GOAL #1   Title pt will be I with inital HEP (08/25/2015)   Time 3   Period Weeks   Status On-going   PT SHORT TERM GOAL #2   Title pt will be able to demo proper posture and lifting mechanics  to avoid over activation of the R shoulder muscualture to prevent pain and reinjury (08/25/2015)   Time 3   Period Weeks   Status On-going           PT Long Term Goals - 08/04/15 1757    PT LONG TERM GOAL #1   Title pt will be I with all HEP as of last visit (09/15/2015)   Time 6   Period Weeks   Status New   PT LONG TERM GOAL #2   Title pt will improve R rotation and L side bending by >/= 5 degrees with </=5/10 pain to assist with safety during driving (1/61/0960)   Time 6   Period Weeks   Status  New   PT LONG TERM GOAL #3   Title pt will improve R shoulder strength to >/= 4-/5 with </= 5/10 pain during testing to promote functional lifting and carrying required for ADLS (09/15/2015)   Time 6   Period Weeks   Status New   PT LONG TERM GOAL #4   Title pt will be able to sit and write for >/= 30 minutes with </= 3/10 pain to assist with personal goal of getting back to writing (09/15/2015)   Time 6   Period Weeks   Status New   PT LONG TERM GOAL #5   Title pt will improve her FOTO score to >/= 50 to demonstrate improvement in function at discharge (09/15/2015)   Time 6   Period Weeks   Status New               Plan - 08/18/15 1300    Clinical Impression Statement [name] reported no pain when she woke up but since did her hair she had increased pain rated at 7/10. discussed at length chronic pain. utilized MHP and E-stim to control pain and upper trap inhibition taping to prevent upper trap activation. Reinforced  posture keeping the shoulder down which pt reported understanding and will work hard on.    PT Next Visit Plan assess response to upper trap inhibition taping, , manual of r shoulder musculature, modalities PRN   Consulted and Agree with Plan of Care Patient      Patient will benefit from skilled therapeutic intervention in order to improve the following deficits and impairments:  Improper body mechanics, Postural dysfunction, Pain, Decreased strength, Increased muscle spasms, Hypomobility, Decreased endurance, Decreased range of motion, Impaired UE functional use, Increased fascial restricitons, Decreased activity tolerance  Visit Diagnosis: Pain in right shoulder  Radiculopathy, cervical region  Cramp and spasm  Muscle weakness (generalized)  Abnormal posture     Problem List Patient Active Problem List   Diagnosis Date Noted  . S/P cervical spinal fusion 03/04/2015  . Obstructive apnea 12/08/2014  . 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  Tingling 08/19/2014  . Limb weakness 08/19/2014  . Muscle weakness (generalized) 08/19/2014  . Muscle ache 07/25/2014  . Arthralgia of multiple joints 07/24/2014  . Encounter for general adult medical examination without abnormal findings 07/24/2014  . Migraine without  aura and responsive to treatment 06/09/2014  . Abdominal pain, generalized 01/31/2014  . Chronic headache 01/30/2014  . Cannot sleep 06/24/2013  . 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 Essential (primary) hypertension 05/13/2013  . Digestive disorder 05/13/2013  . H/O injury, presenting hazards to health 05/13/2013  . Bipolar affective disorder (HCC)   . Bipolar I disorder, most recent episode depressed (HCC) 11/15/2012  . PTSD (post-traumatic stress disorder) 11/15/2012    Lulu Riding PT, DPT, LAT, ATC  08/18/2015  1:08 PM      [facility] [address] [place], [place], [address] Phone: [phone]   Fax:  [phone]  Name: Karen [name] MRN: [id number] Date of Birth: 1971-03-22

## 2015-08-20 ENCOUNTER — Encounter: Payer: Self-pay | Admitting: Physical Therapy

## 2015-08-21 ENCOUNTER — Ambulatory Visit: Payer: Medicare Other | Admitting: Physical Therapy

## 2015-08-21 DIAGNOSIS — M5412 Radiculopathy, cervical region: Secondary | ICD-10-CM

## 2015-08-21 DIAGNOSIS — M25511 Pain in right shoulder: Secondary | ICD-10-CM | POA: Diagnosis not present

## 2015-08-21 DIAGNOSIS — R252 Cramp and spasm: Secondary | ICD-10-CM

## 2015-08-21 DIAGNOSIS — R293 Abnormal posture: Secondary | ICD-10-CM

## 2015-08-21 DIAGNOSIS — M6281 Muscle weakness (generalized): Secondary | ICD-10-CM

## 2015-08-21 NOTE — Therapy (Signed)
[facility] [address] [place], [place], [address] Phone: [phone]   Fax:  [phone]  Physical Therapy Treatment  Patient Details  Name: Karen [name] MRN: [id number] Date of Birth: 19-Sep-1970 Referring Provider: Marikay Alar MD  Encounter Date: 08/21/2015      PT End of Session - 08/21/15 1219    Visit Number 5   Number of Visits 13   Date for PT Re-Evaluation 09/15/15   Authorization Type Medicare: kx mod by 15 th visit, progress note by 10 visit.    PT Start Time 0932   PT Stop Time 1016   PT Time Calculation (min) 44 min   Activity Tolerance Patient tolerated treatment well   Behavior During Therapy WFL for tasks assessed/performed      Past Medical History  Diagnosis Date  . Hypertension   . Migraine   . IBS (irritable bowel syndrome)   . Bipolar affective disorder (HCC)   . PTSD (post-traumatic stress disorder)   . Pulmonary embolism (HCC)   . DVT (deep venous thrombosis) (HCC)   . Bowel obstruction (HCC)   . Depression   . Anxiety   . GERD (gastroesophageal reflux disease)   . Arthritis   . Anemia     Past Surgical History  Procedure Laterality Date  . Abdominal surgery    . Abdominal hysterectomy    . Knee arthroscopy    . Appendectomy    . Cholecystectomy    . Tubal ligation    . Anterior cervical decomp/discectomy fusion N/A 03/04/2015    Procedure: ANTERIOR CERVICAL DECOMPRESSION/DISCECTOMY FUSION CERVICAL SIX-SEVEN;  Surgeon: Tia Alert, MD;  Location: [facility];  Service: Neurosurgery;  Laterality: N/A;    There were no vitals filed for this visit.      Subjective Assessment - 08/21/15 0936    Subjective doing better but still sore in the shoulder the tape helped but feeling sore in a different place   Currently in Pain? Yes   Pain Score 5    Pain Location Neck   Pain Orientation Right   Pain Type Chronic pain   Pain Onset More than a month ago   Pain Frequency Constant                          [facility] Adult PT Treatment/Exercise - 08/21/15 1010    Shoulder Exercises: Standing   External Rotation AROM;Strengthening;10 reps   Theraband Level (Shoulder External Rotation) Level 1 (Yellow)   Internal Rotation AROM;Strengthening;Right;Theraband   Theraband Level (Shoulder Internal Rotation) Level 1 (Yellow)   Row AROM;Strengthening;10 reps   Theraband Level (Shoulder Row) Level 1 (Yellow)   Shoulder Exercises: Stretch   Other Shoulder Stretches upper trap stretch 4 x 30   Moist Heat Therapy   Number Minutes Moist Heat 10 Minutes   Moist Heat Location Shoulder   Electrical Stimulation   Electrical Stimulation Location R upper trap   Electrical Stimulation Action IFC   Electrical Stimulation Parameters 100% scan, L12 x 10 min   Electrical Stimulation Goals Pain   Manual Therapy   Soft tissue mobilization IASTM of the L upper trap/ levator scap   McConnell upper trap inhibition taping using one "I" strip/ one  "X" strips          Trigger Point Dry Needling - 08/21/15 1228    Consent Given? Yes   Education Handout Provided No  given previously   Muscles Treated Upper Body  Upper trapezius   Upper Trapezius Response Twitch reponse elicited;Palpable increased muscle length              PT Education - 08/21/15 1218    Education provided Yes   Education Details shoulder mobility with scapulohumeral rhythm and how it can cause impingement and cause the upper trap to acitivate to get more mobility.    Person(s) Educated Patient   Methods Explanation   Comprehension Verbalized understanding          PT Short Term Goals - 08/14/15 1201    PT SHORT TERM GOAL #1   Title pt will be I with inital HEP (08/25/2015)   Time 3   Period Weeks   Status On-going   PT SHORT TERM GOAL #2   Title pt will be able to demo proper posture and lifting mechanics  to avoid over activation of the R shoulder muscualture to prevent pain and reinjury  (08/25/2015)   Time 3   Period Weeks   Status On-going           PT Long Term Goals - 08/04/15 1757    PT LONG TERM GOAL #1   Title pt will be I with all HEP as of last visit (09/15/2015)   Time 6   Period Weeks   Status New   PT LONG TERM GOAL #2   Title pt will improve R rotation and L side bending by >/= 5 degrees with </=5/10 pain to assist with safety during driving (6/21/30865/30/2017)   Time 6   Period Weeks   Status New   PT LONG TERM GOAL #3   Title pt will improve R shoulder strength to >/= 4-/5 with </= 5/10 pain during testing to promote functional lifting and carrying required for ADLS (09/15/2015)   Time 6   Period Weeks   Status New   PT LONG TERM GOAL #4   Title pt will be able to sit and write for >/= 30 minutes with </= 3/10 pain to assist with personal goal of getting back to writing (09/15/2015)   Time 6   Period Weeks   Status New   PT LONG TERM GOAL #5   Title pt will improve her FOTO score to >/= 50 to demonstrate improvement in function at discharge (09/15/2015)   Time 6   Period Weeks   Status New               Plan - 08/21/15 1219    Clinical Impression Statement Mrs. Meno is doing better today but is still sore. she asked about rotator cuff involvement but educated that due to multiple comorbidities and her hypersensitivity that testing wouldn't be definitive. Performed DN on the R upper trap and monitored pt during tx. Worked on scapular stability exercies and rotator cuff strengthening. used E-stim, MHP post session to decrease pain.    PT Next Visit Plan assess response to upper trap inhibition taping, , manual of r shoulder musculature, modalities PRN   Consulted and Agree with Plan of Care Patient      Patient will benefit from skilled therapeutic intervention in order to improve the following deficits and impairments:  Improper body mechanics, Postural dysfunction, Pain, Decreased strength, Increased muscle spasms, Hypomobility, Decreased  endurance, Decreased range of motion, Impaired UE functional use, Increased fascial restricitons, Decreased activity tolerance  Visit Diagnosis: Pain in right shoulder  Radiculopathy, cervical region  Cramp and spasm  Muscle weakness (generalized)  Abnormal posture  Problem List Patient Active Problem List   Diagnosis Date Noted  . S/P cervical spinal fusion 03/04/2015  . Obstructive apnea 12/08/2014  . 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 Tingling 08/19/2014  . Limb weakness 08/19/2014  . Muscle weakness (generalized) 08/19/2014  . Muscle ache 07/25/2014  . Arthralgia of multiple joints 07/24/2014  . Encounter for general adult medical examination without abnormal findings 07/24/2014  . Migraine without aura and responsive to treatment 06/09/2014  . Abdominal pain, generalized 01/31/2014  . Chronic headache 01/30/2014  . Cannot sleep 06/24/2013  . 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 Essential (primary) hypertension 05/13/2013  . Digestive disorder 05/13/2013  . H/O injury, presenting hazards to health 05/13/2013  . Bipolar affective disorder (HCC)   . Bipolar I disorder, most recent episode depressed (HCC) 11/15/2012  . PTSD (post-traumatic stress disorder) 11/15/2012   Lulu Riding PT, DPT, LAT, ATC  08/21/2015  12:31 PM      [facility] [address] [place], [place], [address] Phone: [phone]   Fax:  [phone]  Name: Orene Abbasi MRN: [id number] Date of Birth: 07-Sep-1970

## 2015-08-24 ENCOUNTER — Encounter: Payer: Self-pay | Admitting: Physical Therapy

## 2015-08-28 ENCOUNTER — Ambulatory Visit: Payer: Medicare Other | Admitting: Physical Therapy

## 2015-08-28 DIAGNOSIS — M6281 Muscle weakness (generalized): Secondary | ICD-10-CM

## 2015-08-28 DIAGNOSIS — M25511 Pain in right shoulder: Secondary | ICD-10-CM

## 2015-08-28 DIAGNOSIS — R293 Abnormal posture: Secondary | ICD-10-CM

## 2015-08-28 DIAGNOSIS — R252 Cramp and spasm: Secondary | ICD-10-CM

## 2015-08-28 DIAGNOSIS — M5412 Radiculopathy, cervical region: Secondary | ICD-10-CM

## 2015-08-28 NOTE — Therapy (Signed)
[facility] [address] [place], [place], [address] Phone: [phone]   Fax:  [phone]  Physical Therapy Treatment  Patient Details  Name: Karen [name] MRN: [id number] Date of Birth: 05/25/1970 Referring Provider: Marikay Alaravid Jones MD  Encounter Date: 08/28/2015      PT End of Session - 08/28/15 0933    Visit Number 6   Number of Visits 13   Date for PT Re-Evaluation 09/15/15   Authorization Type Medicare: kx mod by 15 th visit, progress note by 10 visit.    PT Start Time [phone]   PT Stop Time [phone]   PT Time Calculation (min) 52 min   Activity Tolerance Patient tolerated treatment well   Behavior During Therapy [facility] for tasks assessed/performed      Past Medical History  Diagnosis Date  . Hypertension   . Migraine   . IBS (irritable bowel syndrome)   . Bipolar affective disorder (HCC)   . PTSD (post-traumatic stress disorder)   . Pulmonary embolism (HCC)   . DVT (deep venous thrombosis) (HCC)   . Bowel obstruction (HCC)   . Depression   . Anxiety   . GERD (gastroesophageal reflux disease)   . Arthritis   . Anemia     Past Surgical History  Procedure Laterality Date  . Abdominal surgery    . Abdominal hysterectomy    . Knee arthroscopy    . Appendectomy    . Cholecystectomy    . Tubal ligation    . Anterior cervical decomp/discectomy fusion N/A 03/04/2015    Procedure: ANTERIOR CERVICAL DECOMPRESSION/DISCECTOMY FUSION CERVICAL SIX-SEVEN;  Surgeon: Tia Alertavid S Jones, MD;  Location: [facility];  Service: Neurosurgery;  Laterality: N/A;    There were no vitals filed for this visit.      Subjective Assessment - 08/28/15 0849    Subjective "yesterday wasn't a good day, had botox injections in the upper trap on Tuesday"   Currently in Pain? Yes   Pain Score 6    Pain Location Neck   Pain Orientation Right   Pain Descriptors / Indicators Aching;Sore   Pain Type Chronic pain   Pain Onset More than a month ago   Pain Frequency Constant   Aggravating Factors  weather, unknown just hurts,    Pain Relieving Factors pain medication, injections, heat pad,             [facility] PT Assessment - 08/28/15 0858    Observation/Other Assessments   Focus on Therapeutic Outcomes (FOTO)  66% limited   AROM   Cervical Flexion 32  end range pain   Cervical Extension 12  end range pain   Cervical - Right Side Bend 32  end range   Cervical - Left Side Bend 28   Cervical - Right Rotation 38   Cervical - Left Rotation 45                     [facility] Adult PT Treatment/Exercise - 08/28/15 0001    Shoulder Exercises: Standing   Row AROM;Strengthening;10 reps   Theraband Level (Shoulder Row) Level 1 (Yellow)   Shoulder Exercises: Stretch   Other Shoulder Stretches upper trap stretch 4 x 30   Moist Heat Therapy   Number Minutes Moist Heat 10 Minutes   Moist Heat Location Shoulder   Electrical Stimulation   Electrical Stimulation Location L infraspinatus   Electrical Stimulation Action IFC   Electrical Stimulation Parameters 100% scan, L 9 x 10 min  Electrical Stimulation Goals Pain   Manual Therapy   Joint Mobilization Grade 1-2 T1-T6 Mobs P>A   Soft tissue mobilization IASTM of the L upper trap/ levator scap                  PT Short Term Goals - 08/14/15 1201    PT SHORT TERM GOAL #1   Title pt will be I with inital HEP (08/25/2015)   Time 3   Period Weeks   Status On-going   PT SHORT TERM GOAL #2   Title pt will be able to demo proper posture and lifting mechanics  to avoid over activation of the R shoulder muscualture to prevent pain and reinjury (08/25/2015)   Time 3   Period Weeks   Status On-going           PT Long Term Goals - 08/04/15 1757    PT LONG TERM GOAL #1   Title pt will be I with all HEP as of last visit (09/15/2015)   Time 6   Period Weeks   Status New   PT LONG TERM GOAL #2   Title pt will improve R rotation and L side bending by >/= 5 degrees with  </=5/10 pain to assist with safety during driving (1/61/0960)   Time 6   Period Weeks   Status New   PT LONG TERM GOAL #3   Title pt will improve R shoulder strength to >/= 4-/5 with </= 5/10 pain during testing to promote functional lifting and carrying required for ADLS (09/15/2015)   Time 6   Period Weeks   Status New   PT LONG TERM GOAL #4   Title pt will be able to sit and write for >/= 30 minutes with </= 3/10 pain to assist with personal goal of getting back to writing (09/15/2015)   Time 6   Period Weeks   Status New   PT LONG TERM GOAL #5   Title pt will improve her FOTO score to >/= 50 to demonstrate improvement in function at discharge (09/15/2015)   Time 6   Period Weeks   Status New               Plan - 08/28/15 4540    Clinical Impression Statement Mrs. Harton continues to report pain in the shoulder and neck. she reported getting injections last Tuesday and reported it hasn't helped. She hasn't improved her cervical ROM and her FOTO has dropped from a 41 to a 34. performed DN on her infrapsinatus, and performed IASTM and E-stim  to calm down soreness from todays session. Discussed with pt that based on her measurements today that if no progress is made in the next 2 visits that plan is to discharge and refer her back to her physician which pt agreed.    PT Next Visit Plan assess response to upper trap inhibition taping, , manual of r shoulder musculature, modalities PRN   Consulted and Agree with Plan of Care Patient      Patient will benefit from skilled therapeutic intervention in order to improve the following deficits and impairments:  Improper body mechanics, Postural dysfunction, Pain, Decreased strength, Increased muscle spasms, Hypomobility, Decreased endurance, Decreased range of motion, Impaired UE functional use, Increased fascial restricitons, Decreased activity tolerance  Visit Diagnosis: Pain in right shoulder  Radiculopathy, cervical region  Cramp  and spasm  Muscle weakness (generalized)  Abnormal posture     Problem List Patient Active Problem List  Diagnosis Date Noted  . S/P cervical spinal fusion 03/04/2015  . Obstructive apnea 12/08/2014  . 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 Tingling 08/19/2014  . Limb weakness 08/19/2014  . Muscle weakness (generalized) 08/19/2014  . Muscle ache 07/25/2014  . Arthralgia of multiple joints 07/24/2014  . Encounter for general adult medical examination without abnormal findings 07/24/2014  . Migraine without aura and responsive to treatment 06/09/2014  . Abdominal pain, generalized 01/31/2014  . Chronic headache 01/30/2014  . Cannot sleep 06/24/2013  . 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 Essential (primary) hypertension 05/13/2013  . Digestive disorder 05/13/2013  . H/O injury, presenting hazards to health 05/13/2013  . Bipolar affective disorder (HCC)   . Bipolar I disorder, most recent episode depressed (HCC) 11/15/2012  . PTSD (post-traumatic stress disorder) 11/15/2012   Lulu Riding PT, DPT, LAT, ATC  08/28/2015  12:28 PM     [facility] [address] [place], [place], [address] Phone: [phone]   Fax:  [phone]  Name: Kelse Ploch MRN: [id number] Date of Birth: 05-15-1970

## 2015-09-01 ENCOUNTER — Ambulatory Visit: Payer: Medicare Other | Admitting: Physical Therapy

## 2015-09-01 DIAGNOSIS — M5412 Radiculopathy, cervical region: Secondary | ICD-10-CM

## 2015-09-01 DIAGNOSIS — R252 Cramp and spasm: Secondary | ICD-10-CM

## 2015-09-01 DIAGNOSIS — M25511 Pain in right shoulder: Secondary | ICD-10-CM | POA: Diagnosis not present

## 2015-09-01 DIAGNOSIS — M6281 Muscle weakness (generalized): Secondary | ICD-10-CM

## 2015-09-01 DIAGNOSIS — R293 Abnormal posture: Secondary | ICD-10-CM

## 2015-09-01 NOTE — Therapy (Signed)
[place], [place], [address] Phone: [phone]   Fax:  [phone]  Physical Therapy Treatment / Discharged note  Patient Details  Name: Karen [name] MRN: [id number] Date of Birth: [birth year] Referring Provider: Sherley Bounds MD  Encounter Date: 09/01/2015      PT End of Session - 09/01/15 0904    Visit Number 7   Number of Visits 13   Date for PT Re-Evaluation 09/15/15   Authorization Type Medicare: kx mod by 15 th visit, progress note by 10 visit.    PT Start Time [phone]   PT Stop Time 0930   PT Time Calculation (min) 47 min   Activity Tolerance Patient tolerated treatment well;Patient limited by pain   Behavior During Therapy [facility] for tasks assessed/performed      Past Medical History  Diagnosis Date  . Hypertension   . Migraine   . IBS (irritable bowel syndrome)   . Bipolar affective disorder ([place])   . PTSD (post-traumatic stress disorder)   . Pulmonary embolism ([place])   . DVT (deep venous thrombosis) ([place])   . Bowel obstruction ([place])   . Depression   . Anxiety   . GERD (gastroesophageal reflux disease)   . Arthritis   . Anemia     Past Surgical History  Procedure Laterality Date  . Abdominal surgery    . Abdominal hysterectomy    . Knee arthroscopy    . Appendectomy    . Cholecystectomy    . Tubal ligation    . Anterior cervical decomp/discectomy fusion N/A 03/04/2015    Procedure: ANTERIOR CERVICAL DECOMPRESSION/DISCECTOMY FUSION CERVICAL SIX-SEVEN;  Surgeon: Eustace Moore, MD;  Location: [place] [facility];  Service: Neurosurgery;  Laterality: N/A;    There were no vitals filed for this visit.      Subjective Assessment - 09/01/15 0840    Subjective "doing so so today, had a rough weekend and had to call the Md and he wants me to see him this coming monday"   Currently in Pain? Yes   Pain Score 5    Pain Location Neck   Pain Orientation Right   Pain Descriptors / Indicators  Aching;Sharp;Throbbing   Pain Type Chronic pain   Pain Onset More than a month ago   Pain Frequency Constant            [facility] PT Assessment - 09/01/15 0848    AROM   Cervical Flexion 22   Cervical Extension 14   Cervical - Right Side Bend 22   Cervical - Left Side Bend 24  increased end range pain    Cervical - Right Rotation 40   Cervical - Left Rotation 50                     [facility] Adult PT Treatment/Exercise - 09/01/15 0001    Moist Heat Therapy   Number Minutes Moist Heat 15 Minutes   Moist Heat Location Shoulder  pt in supine   Electrical Stimulation   Electrical Stimulation Location posterolateral shoulder   Electrical Stimulation Action IFC   Electrical Stimulation Parameters 100% scan, L 10.5, x 15 min   Electrical Stimulation Goals Pain                PT Education - 09/01/15 0933    Education provided Yes   Education Details discussed lack of progress and that she is regressing in her mobility with continued pain and tightness of the  upper trap.    Person(s) Educated Patient   Methods Explanation   Comprehension Verbalized understanding          PT Short Term Goals - 24-Sep-2015 0908    PT SHORT TERM GOAL #1   Title pt will be I with inital HEP (08/25/2015)   Baseline inconsistent due to pain   Period Weeks   Status Partially Met   PT SHORT TERM GOAL #2   Title pt will be able to demo proper posture and lifting mechanics  to avoid over activation of the R shoulder muscualture to prevent pain and reinjury (08/25/2015)   Baseline inconsistent due to pain   Time 3   Period Weeks   Status Partially Met           PT Long Term Goals - 24-Sep-2015 0908    PT LONG TERM GOAL #1   Title pt will be I with all HEP as of last visit (09/15/2015)   Time 6   Period Weeks   Status On-going   PT LONG TERM GOAL #2   Title pt will improve R rotation and L side bending by >/= 5 degrees with </=5/10 pain to assist with safety during driving  (11/25/9831)   Time 6   Period Weeks   Status On-going   PT LONG TERM GOAL #3   Title pt will improve R shoulder strength to >/= 4-/5 with </= 5/10 pain during testing to promote functional lifting and carrying required for ADLS (12/10/537)   Time 6   Period Weeks   Status On-going   PT LONG TERM GOAL #4   Title pt will be able to sit and write for >/= 30 minutes with </= 3/10 pain to assist with personal goal of getting back to writing (09/15/2015)   Time 6   Period Weeks   Status On-going   PT LONG TERM GOAL #5   Title pt will improve her FOTO score to >/= 50 to demonstrate improvement in function at discharge (09/15/2015)   Time 6   Period Weeks   Status On-going               Plan - 09/24/2015 0904    Clinical Impression Statement Karen [name] reported increased pain over weekend and stated she called her MD and is set up for an appointment on 09/07/2015. Reassessed her cervical measurements today per her and her peers request to see if her mobility/ pain is effected by wea (raining at the time) and her measurements have worsened today compared to last session (sunny). Attempted to control pain with E-stim and heat which pt reported little to no pain relief, with movement and light touch. due lack of progress in mobility and no progress on goals she will be discharged from physical therapy and referred back to her Physician for further assessment which pt agreed.    PT Next Visit Plan discharge from PT today.    Consulted and Agree with Plan of Care Patient      Patient will benefit from skilled therapeutic intervention in order to improve the following deficits and impairments:  Improper body mechanics, Postural dysfunction, Pain, Decreased strength, Increased muscle spasms, Hypomobility, Decreased endurance, Decreased range of motion, Impaired UE functional use, Increased fascial restricitons, Decreased activity tolerance  Visit Diagnosis: Radiculopathy, cervical region  Pain in  right shoulder  Cramp and spasm  Muscle weakness (generalized)  Abnormal posture       G-Codes - Sep 24, 2015 0932    Functional Assessment  Tool Used FOTO/ Clinical judgement   Functional Limitation Changing and maintaining body position   Changing and Maintaining Body Position Goal Status [phone]) At least 40 percent but less than 60 percent impaired, limited or restricted   Changing and Maintaining Body Position Discharge Status ([id number]) At least 60 percent but less than 80 percent impaired, limited or restricted      Problem List Patient Active Problem List   Diagnosis Date Noted  . S/P cervical spinal fusion 03/04/2015  . Obstructive apnea 12/08/2014  . 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 Tingling 08/19/2014  . Limb weakness 08/19/2014  . Muscle weakness (generalized) 08/19/2014  . Muscle ache 07/25/2014  . Arthralgia of multiple joints 07/24/2014  . Encounter for general adult medical examination without abnormal findings 07/24/2014  . Migraine without aura and responsive to treatment 06/09/2014  . Abdominal pain, generalized 01/31/2014  . Chronic headache 01/30/2014  . Cannot sleep 06/24/2013  . 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 Essential (primary) hypertension 05/13/2013  . Digestive disorder 05/13/2013  . H/O injury, presenting hazards to health 05/13/2013  . Bipolar affective disorder ([place])   . Bipolar I disorder, most recent episode depressed ([place]) 11/15/2012  . PTSD (post-traumatic stress disorder) 11/15/2012   Starr Lake PT, DPT, LAT, ATC  09/01/2015  9:34 AM      [facility] [address] [place], [place], [address] Phone: [phone]   Fax:  [phone]  Name: Karen [name] MRN: [id number] Date of Birth: 06-07-1970    PHYSICAL THERAPY DISCHARGE SUMMARY  Visits from Start of  Care: 7  Current functional level related to goals / functional outcomes: See goals, FOTO 66% limited   Remaining deficits: Limited cervical mobility, limited shoulder AROM and strength. Pain in the R upper trap and R posterior shoulder. Spasm of the R upper trap and surrounding shoulder shoulder musculature.    Education / Equipment: HEP, posture education. HEP  Plan: Patient agrees to discharge.  Patient goals were not met. Patient is being discharged due to lack of progress.  ?????

## 2015-09-04 ENCOUNTER — Ambulatory Visit: Payer: Medicare Other | Admitting: Physical Therapy

## 2015-09-23 ENCOUNTER — Emergency Department (HOSPITAL_COMMUNITY)
Admission: EM | Admit: 2015-09-23 | Discharge: 2015-09-23 | Disposition: A | Discharge status: Home / Self Care | Payer: Medicare Other | Attending: Emergency Medicine | Admitting: Emergency Medicine

## 2015-09-23 ENCOUNTER — Encounter (HOSPITAL_COMMUNITY): Payer: Self-pay | Admitting: Emergency Medicine

## 2015-09-23 ENCOUNTER — Emergency Department (HOSPITAL_COMMUNITY): Payer: Medicare Other

## 2015-09-23 DIAGNOSIS — R42 Dizziness and giddiness: Secondary | ICD-10-CM | POA: Insufficient documentation

## 2015-09-23 DIAGNOSIS — M1990 Unspecified osteoarthritis, unspecified site: Secondary | ICD-10-CM | POA: Insufficient documentation

## 2015-09-23 DIAGNOSIS — I10 Essential (primary) hypertension: Secondary | ICD-10-CM | POA: Insufficient documentation

## 2015-09-23 DIAGNOSIS — F419 Anxiety disorder, unspecified: Secondary | ICD-10-CM | POA: Insufficient documentation

## 2015-09-23 DIAGNOSIS — R0602 Shortness of breath: Secondary | ICD-10-CM | POA: Diagnosis not present

## 2015-09-23 DIAGNOSIS — F319 Bipolar disorder, unspecified: Secondary | ICD-10-CM | POA: Insufficient documentation

## 2015-09-23 DIAGNOSIS — Z862 Personal history of diseases of the blood and blood-forming organs and certain disorders involving the immune mechanism: Secondary | ICD-10-CM | POA: Diagnosis not present

## 2015-09-23 DIAGNOSIS — Z87891 Personal history of nicotine dependence: Secondary | ICD-10-CM | POA: Diagnosis not present

## 2015-09-23 DIAGNOSIS — Z86711 Personal history of pulmonary embolism: Secondary | ICD-10-CM | POA: Insufficient documentation

## 2015-09-23 DIAGNOSIS — R079 Chest pain, unspecified: Secondary | ICD-10-CM | POA: Insufficient documentation

## 2015-09-23 DIAGNOSIS — Z79899 Other long term (current) drug therapy: Secondary | ICD-10-CM | POA: Insufficient documentation

## 2015-09-23 DIAGNOSIS — Z86718 Personal history of other venous thrombosis and embolism: Secondary | ICD-10-CM | POA: Diagnosis not present

## 2015-09-23 DIAGNOSIS — K219 Gastro-esophageal reflux disease without esophagitis: Secondary | ICD-10-CM | POA: Insufficient documentation

## 2015-09-23 LAB — CBC
HEMATOCRIT: 39.8 % (ref 36.0–46.0)
HEMOGLOBIN: 12.9 g/dL (ref 12.0–15.0)
MCH: 29.7 pg (ref 26.0–34.0)
MCHC: 32.4 g/dL (ref 30.0–36.0)
MCV: 91.5 fL (ref 78.0–100.0)
PLATELETS: 256 10*3/uL (ref 150–400)
RBC: 4.35 MIL/uL (ref 3.87–5.11)
RDW: 12 % (ref 11.5–15.5)
WBC: 8.5 10*3/uL (ref 4.0–10.5)

## 2015-09-23 LAB — BASIC METABOLIC PANEL
ANION GAP: 9 (ref 5–15)
BUN: 12 mg/dL (ref 6–20)
CHLORIDE: 106 mmol/L (ref 101–111)
CO2: 20 mmol/L — AB (ref 22–32)
Calcium: 9.5 mg/dL (ref 8.9–10.3)
Creatinine, Ser: 0.76 mg/dL (ref 0.44–1.00)
GFR calc non Af Amer: >60 mL/min (ref >60)
Glucose, Bld: 91 mg/dL (ref 65–99)
POTASSIUM: 4 mmol/L (ref 3.5–5.1)
SODIUM: 135 mmol/L (ref 135–145)

## 2015-09-23 LAB — I-STAT TROPONIN, ED: Troponin i, poc: 0 ng/mL (ref 0.00–0.08)

## 2015-09-23 MED ORDER — KETOROLAC TROMETHAMINE 60 MG/2ML IM SOLN
60.0000 mg | Freq: Once | INTRAMUSCULAR | Status: AC
  Administered 2015-09-23: 60 mg via INTRAMUSCULAR
  Filled 2015-09-23: qty 2

## 2015-09-23 NOTE — Discharge Instructions (Signed)
Ibuprofen 600 mg 3 times daily for the next 5 days.  Return to the emergency department if you develop worsening chest pain, difficulty breathing, or other new and concerning symptoms.   Nonspecific Chest Pain  Chest pain can be caused by many different conditions. There is always a chance that your pain could be related to something serious, such as a heart attack or a blood clot in your lungs. Chest pain can also be caused by conditions that are not life-threatening. If you have chest pain, it is very important to follow up with your health care provider. CAUSES  Chest pain can be caused by:  Heartburn.  Pneumonia or bronchitis.  Anxiety or stress.  Inflammation around your heart (pericarditis) or lung (pleuritis or pleurisy).  A blood clot in your lung.  A collapsed lung (pneumothorax). It can develop suddenly on its own (spontaneous pneumothorax) or from trauma to the chest.  Shingles infection (varicella-zoster virus).  Heart attack.  Damage to the bones, muscles, and cartilage that make up your chest wall. This can include:  Bruised bones due to injury.  Strained muscles or cartilage due to frequent or repeated coughing or overwork.  Fracture to one or more ribs.  Sore cartilage due to inflammation (costochondritis). RISK FACTORS  Risk factors for chest pain may include:  Activities that increase your risk for trauma or injury to your chest.  Respiratory infections or conditions that cause frequent coughing.  Medical conditions or overeating that can cause heartburn.  Heart disease or family history of heart disease.  Conditions or health behaviors that increase your risk of developing a blood clot.  Having had chicken pox (varicella zoster). SIGNS AND SYMPTOMS Chest pain can feel like:  Burning or tingling on the surface of your chest or deep in your chest.  Crushing, pressure, aching, or squeezing pain.  Dull or sharp pain that is worse when you move,  cough, or take a deep breath.  Pain that is also felt in your back, neck, shoulder, or arm, or pain that spreads to any of these areas. Your chest pain may come and go, or it may stay constant. DIAGNOSIS Lab tests or other studies may be needed to find the cause of your pain. Your health care provider may have you take a test called an ambulatory ECG (electrocardiogram). An ECG records your heartbeat patterns at the time the test is performed. You may also have other tests, such as:  Transthoracic echocardiogram (TTE). During echocardiography, sound waves are used to create a picture of all of the heart structures and to look at how blood flows through your heart.  Transesophageal echocardiogram (TEE).This is a more advanced imaging test that obtains images from inside your body. It allows your health care provider to see your heart in finer detail.  Cardiac monitoring. This allows your health care provider to monitor your heart rate and rhythm in real time.  Holter monitor. This is a portable device that records your heartbeat and can help to diagnose abnormal heartbeats. It allows your health care provider to track your heart activity for several days, if needed.  Stress tests. These can be done through exercise or by taking medicine that makes your heart beat more quickly.  Blood tests.  Imaging tests. TREATMENT  Your treatment depends on what is causing your chest pain. Treatment may include:  Medicines. These may include:  Acid blockers for heartburn.  Anti-inflammatory medicine.  Pain medicine for inflammatory conditions.  Antibiotic medicine, if an infection  is present.  Medicines to dissolve blood clots.  Medicines to treat coronary artery disease.  Supportive care for conditions that do not require medicines. This may include:  Resting.  Applying heat or cold packs to injured areas.  Limiting activities until pain decreases. HOME CARE INSTRUCTIONS  If you were  prescribed an antibiotic medicine, finish it all even if you start to feel better.  Avoid any activities that bring on chest pain.  Do not use any tobacco products, including cigarettes, chewing tobacco, or electronic cigarettes. If you need help quitting, ask your health care provider.  Do not drink alcohol.  Take medicines only as directed by your health care provider.  Keep all follow-up visits as directed by your health care provider. This is important. This includes any further testing if your chest pain does not go away.  If heartburn is the cause for your chest pain, you may be told to keep your head raised (elevated) while sleeping. This reduces the chance that acid will go from your stomach into your esophagus.  Make lifestyle changes as directed by your health care provider. These may include:  Getting regular exercise. Ask your health care provider to suggest some activities that are safe for you.  Eating a heart-healthy diet. A registered dietitian can help you to learn healthy eating options.  Maintaining a healthy weight.  Managing diabetes, if necessary.  Reducing stress. SEEK MEDICAL CARE IF:  Your chest pain does not go away after treatment.  You have a rash with blisters on your chest.  You have a fever. SEEK IMMEDIATE MEDICAL CARE IF:   Your chest pain is worse.  You have an increasing cough, or you cough up blood.  You have severe abdominal pain.  You have severe weakness.  You faint.  You have chills.  You have sudden, unexplained chest discomfort.  You have sudden, unexplained discomfort in your arms, back, neck, or jaw.  You have shortness of breath at any time.  You suddenly start to sweat, or your skin gets clammy.  You feel nauseous or you vomit.  You suddenly feel light-headed or dizzy.  Your heart begins to beat quickly, or it feels like it is skipping beats. These symptoms may represent a serious problem that is an emergency. Do  not wait to see if the symptoms will go away. Get medical help right away. Call your local emergency services (911 in the [place] Do not drive yourself to the hospital.   This information is not intended to replace advice given to you by your health care provider. Make sure you discuss any questions you have with your health care provider.   Document Released: 01/12/2005 Document Revised: 04/25/2014 Document Reviewed: 11/08/2013 Elsevier Interactive Patient Education [employer].

## 2015-09-23 NOTE — ED Provider Notes (Signed)
CSN: [id number]     Arrival date & time 09/23/15  [id number] History   First MD Initiated Contact with Patient 09/23/15 [phone]     Chief Complaint  Patient presents with  . Chest Pain  . Shortness of Breath  . Dizziness     (Consider location/radiation/quality/duration/timing/severity/associated sxs/prior Treatment) HPI Comments: Patient is a 45 year old female with past mental history of hypertension, migraines, bipolar, anxiety. She presents for evaluation of chest discomfort that has been worsening over the past 4-5 days. This began in the absence of any injury or trauma. This is reported as sharp in nature and radiates through to her back. It is worse with palpation and deep breathing. She denies any fevers or chills. She denies any swelling of her ankles or pain in her. She has had similar episodes in the past that have been attributed to costochondritis.  Patient is a 45 y.o. female presenting with chest pain, shortness of breath, and dizziness. The history is provided by the patient.  Chest Pain Pain location:  Substernal area Pain quality: sharp   Pain radiates to:  Upper back Pain radiates to the back: yes   Pain severity:  Moderate Onset quality:  Gradual Duration:  5 days Timing:  Constant Progression:  Worsening Chronicity:  Recurrent Context: breathing and movement   Relieved by:  Nothing Worsened by:  Certain positions, deep breathing and movement Associated symptoms: dizziness and shortness of breath   Shortness of Breath Associated symptoms: chest pain   Dizziness Associated symptoms: chest pain and shortness of breath     Past Medical History  Diagnosis Date  . Hypertension   . Migraine   . IBS (irritable bowel syndrome)   . Bipolar affective disorder (HCC)   . PTSD (post-traumatic stress disorder)   . Pulmonary embolism (HCC)   . DVT (deep venous thrombosis) (HCC)   . Bowel obstruction (HCC)   . Depression   . Anxiety   . GERD (gastroesophageal reflux disease)    . Arthritis   . Anemia    Past Surgical History  Procedure Laterality Date  . Abdominal surgery    . Abdominal hysterectomy    . Knee arthroscopy    . Appendectomy    . Cholecystectomy    . Tubal ligation    . Anterior cervical decomp/discectomy fusion N/A 03/04/2015    Procedure: ANTERIOR CERVICAL DECOMPRESSION/DISCECTOMY FUSION CERVICAL SIX-SEVEN;  Surgeon: Tia Alertavid S Jones, MD;  Location: [facility];  Service: Neurosurgery;  Laterality: N/A;   History reviewed. No pertinent family history. Social History  Substance Use Topics  . Smoking status: Former Smoker -- 0.50 packs/day for 10 years    Types: Cigarettes    Quit date: 04/19/2011  . Smokeless tobacco: None  . Alcohol Use: Yes     Comment: not currently   OB History    No data available     Review of Systems  Respiratory: Positive for shortness of breath.   Cardiovascular: Positive for chest pain.  Neurological: Positive for dizziness.  All other systems reviewed and are negative.     Allergies  Benadryl; Buprenorphine hcl; Morphine and related; Lyrica; Neurontin; Aspirin; Sulfa antibiotics; and Ciprofloxacin hcl  Home Medications   Prior to Admission medications   Medication Sig Start Date End Date Taking? Authorizing Provider  amLODipine (NORVASC) 2.5 MG tablet Take 2.5 mg by mouth daily.  07/24/14   Historical Provider, MD  ARIPiprazole (ABILIFY MAINTENA) 400 MG SUSR Inject into the muscle.    Historical Provider, MD  ARIPiprazole (ABILIFY) 15 MG tablet Take 15 mg by mouth daily.    Historical Provider, MD  furosemide (LASIX) 20 MG tablet Take 1 tablet (20 mg total) by mouth daily. 04/23/15   Lorre Nick, MD  HYDROcodone-acetaminophen (NORCO/VICODIN) 5-325 MG tablet Take 1-2 tablets by mouth every 6 (six) hours as needed for moderate pain. Patient not taking: Reported on 08/04/2015 06/20/15   Vanetta Mulders, MD  levETIRAcetam (KEPPRA) 750 MG tablet Take 750 mg by mouth 2 (two) times daily.  11/17/14 11/17/15   Historical Provider, MD  methocarbamol (ROBAXIN) 500 MG tablet Take 1 tablet (500 mg total) by mouth every 8 (eight) hours as needed for muscle spasms. Patient not taking: Reported on 04/23/2015 03/05/15   Tia Alert, MD  omeprazole (PRILOSEC) 40 MG capsule Take 1 capsule (40 mg total) by mouth 2 (two) times daily as needed (Upset stomach). Patient not taking: Reported on 04/18/2015 12/10/12   Thermon Leyland, NP  ondansetron (ZOFRAN ODT) 4 MG disintegrating tablet Take 1 tablet (4 mg total) by mouth every 8 (eight) hours as needed for nausea or vomiting. 06/20/15   Vanetta Mulders, MD  oxyCODONE-acetaminophen (PERCOCET) 7.5-325 MG tablet Take 1 tablet by mouth every 4 (four) hours as needed for moderate pain or severe pain. Reported on 08/04/2015    Historical Provider, MD  oxyCODONE-acetaminophen (PERCOCET/ROXICET) 5-325 MG tablet Take 1-2 tablets by mouth every 4 (four) hours as needed for moderate pain. 03/05/15   Tia Alert, MD  promethazine (PHENERGAN) 25 MG tablet Take 1 tablet (25 mg total) by mouth every 6 (six) hours as needed for nausea or vomiting. 06/20/15   Vanetta Mulders, MD  traMADol (ULTRAM) 50 MG tablet Take 1 tablet (50 mg total) by mouth every 6 (six) hours as needed. Patient not taking: Reported on 06/20/2015 04/18/15   Gilda Crease, MD  venlafaxine (EFFEXOR) 37.5 MG tablet Take 37.5 mg by mouth 2 (two) times daily.    Historical Provider, MD   BP 129/84 mmHg  Pulse 73  Temp(Src) 98.7 F (37.1 C) (Oral)  Resp 20  SpO2 98% Physical Exam  Constitutional: She is oriented to person, place, and time. She appears well-developed and well-nourished. No distress.  HENT:  Head: Normocephalic and atraumatic.  Neck: Normal range of motion. Neck supple.  Cardiovascular: Normal rate and regular rhythm.  Exam reveals no gallop and no friction rub.   No murmur heard. Pulmonary/Chest: Effort normal. No respiratory distress. She has no wheezes. She has no rales. She exhibits  tenderness.  There is tenderness to palpation overlying the midsternum. This reproduces her symptoms.  Abdominal: Soft. Bowel sounds are normal. She exhibits no distension. There is no tenderness.  Musculoskeletal: Normal range of motion.  There is no edema. Homans sign is absent bilaterally.  Neurological: She is alert and oriented to person, place, and time.  Skin: Skin is warm and dry. She is not diaphoretic.  Nursing note and vitals reviewed.   ED Course  Procedures (including critical care time) Labs Review Labs Reviewed  BASIC METABOLIC PANEL  CBC  I-STAT TROPOININ, ED    Imaging Review Dg Chest 2 View  09/23/2015  CLINICAL DATA:  Right-sided back pain radiating into chest with dizziness and fatigue beginning 4 days ago. EXAM: CHEST  2 VIEW COMPARISON:  04/23/2015 FINDINGS: Lungs are adequately inflated and otherwise clear. Cardiomediastinal silhouette is within normal. Fusion hardware intact over the cervical thoracic junction. Minimal curvature of the thoracic spine convex right unchanged. Minimal spondylosis of  the spine. Surgical clips over the right upper quadrant. IMPRESSION: No active cardiopulmonary disease. Electronically Signed   By: Elberta Fortis M.D.   On: 09/23/2015 09:49   I have personally reviewed and evaluated these images and lab results as part of my medical decision-making.   EKG Interpretation   Date/Time:  Wednesday September 23 2015 09:28:45 EDT Ventricular Rate:  77 PR Interval:  152 QRS Duration: 70 QT Interval:  358 QTC Calculation: 405 R Axis:   54 Text Interpretation:  Normal sinus rhythm Normal ECG Confirmed by Lenell Mcconnell   MD, Haroldine Redler ([id number]) on 09/23/2015 9:43:20 AM      MDM   Final diagnoses:  None    Patient presents with complaints of chest pain and shortness of breath for the past 3-4 days. Her symptoms are atypical for cardiac pain and workup reveals no evidence for a cardiac etiology. I suspect there may be a component of anxiety to her  symptoms, and possibly a musculoskeletal component. She will be discharged with anti-inflammatories, rest, and when necessary return/follow-up.    Geoffery Lyons, MD 09/24/15 [phone]

## 2015-09-23 NOTE — ED Notes (Signed)
Pt reports SOb, chest pain and dizziness that began over the weekend. VSS. Pt with anxious appearance.

## 2015-09-23 NOTE — ED Notes (Signed)
Pt is in stable condition upon d/c and ambulates from ED. 

## 2015-10-01 ENCOUNTER — Other Ambulatory Visit (HOSPITAL_COMMUNITY): Payer: Self-pay | Admitting: Family Medicine

## 2015-10-01 DIAGNOSIS — K920 Hematemesis: Secondary | ICD-10-CM

## 2015-10-01 DIAGNOSIS — R110 Nausea: Secondary | ICD-10-CM

## 2015-10-06 ENCOUNTER — Ambulatory Visit (HOSPITAL_COMMUNITY)
Admission: RE | Admit: 2015-10-06 | Discharge: 2015-10-06 | Disposition: A | Discharge status: Home / Self Care | Payer: Medicare Other | Source: Ambulatory Visit | Attending: Family Medicine | Admitting: Family Medicine

## 2015-10-06 DIAGNOSIS — R110 Nausea: Secondary | ICD-10-CM | POA: Diagnosis not present

## 2015-10-06 DIAGNOSIS — K219 Gastro-esophageal reflux disease without esophagitis: Secondary | ICD-10-CM | POA: Insufficient documentation

## 2015-10-06 DIAGNOSIS — K920 Hematemesis: Secondary | ICD-10-CM | POA: Diagnosis present

## 2015-11-24 ENCOUNTER — Encounter: Payer: Self-pay | Admitting: Dietician

## 2015-11-24 ENCOUNTER — Encounter: Payer: Medicare Other | Attending: Family Medicine | Admitting: Dietician

## 2015-11-24 DIAGNOSIS — E669 Obesity, unspecified: Secondary | ICD-10-CM | POA: Diagnosis not present

## 2015-11-24 DIAGNOSIS — Z713 Dietary counseling and surveillance: Secondary | ICD-10-CM | POA: Diagnosis present

## 2015-11-24 DIAGNOSIS — R635 Abnormal weight gain: Secondary | ICD-10-CM

## 2015-11-24 NOTE — Progress Notes (Signed)
  Medical Nutrition Therapy:  Appt start time: 0845 end time:  0945.   Assessment:  Primary concerns today: Karen [name] is here with concerns about weight gain and would like to discuss her diet. She states that she is currently the heaviest she has ever been. She states she has not as active lately. Feels like she eats a lot of sugar but also eats very healthy foods. Loves vegetables and fruit. Has IBS, acid reflux, and a history of H. Pylori. Currently on disability; patient has bipolar disorder. In 2013 she had a cholecystectomy and appendectomy and states that she ended up with a bowel obstruction. Has "episodes where I can't digest my food;" patient states it gets stuck and she starts vomiting. This happens every few months. Had an upper GI a few weeks ago and nothing was found. Karen [name] states she has struggled with reflux and IBS for years. Also has frequent constipation and feels like this may be due to pain medicine. Karen [name] notes an erratic sleeping pattern. She lives alone and her adult son does her grocery shopping. Reads and writes during the day. Struggles with depression and sometimes does not want to be around people. Goes to a mental illness support group for 1 hour a week.   Preferred Learning Style:   No preference indicated   Learning Readiness:   Ready   MEDICATIONS: see list   DIETARY INTAKE:  Patient reports erratic meal pattern. Has difficulty tolerating spicy foods and fried foods.     24-hr recall:  B ( AM): black coffee, oatmeal with butter and sugar   Snk ( AM):   L ( PM): tunafish, greens Snk ( PM):  D ( PM): frozen pizza Snk ( PM):   Wakes up in the night and eats. Snacks on chips and candy.  Beverages: did not assess today  Usual physical activity: did not assess today  Estimated energy needs: 1600-1800 calories  Progress Towards Goal(s):  In progress.   Nutritional Diagnosis:  Sherman-3.4 Unintentional weight gain As related to increased energy intake,  inappropriate food choices, lack of meal structure.  As evidenced by patient reports 20-lb weight gain in recent months.    Intervention:  Nutrition counseling provided. Goals: -Continue to use olive oil for cooking -Continue to use healthy cooking methods  -Continue to be mindful of portion sizes -Start cutting down slowly on sugar in oatmeal  -Try adding almond slices, berries, and cinnamon -Work on developing a Environmental health practitionerstructure  -Start working with a Engineer, civil (consulting)therapist/counselor at [facility]  -Practice going to bed and waking up at the same time every day -Increase activity!  -Look into physical therapy for ankles  -Set a walking date with peer support counselor  -Keep a symptom journal on your phone  Teaching Method Utilized:  Visual Auditory Hands on  Handouts given during visit include:  none  Barriers to learning/adherence to lifestyle change: mental illness  Demonstrated degree of understanding via:  Teach Back   Monitoring/Evaluation:  Dietary intake, exercise, and body weight in 3 week(s).

## 2015-11-24 NOTE — Patient Instructions (Addendum)
-  Continue to use olive oil for cooking -Continue to use healthy cooking methods   -Continue to be mindful of portion sizes  -Start cutting down slowly on sugar in oatmeal  -Try adding almond slices, berries, and cinnamon  -Work on developing a Environmental health practitionerstructure  -Start working with a Engineer, civil (consulting)therapist/counselor at [facility]  -Practice going to bed and waking up at the same time every day  -Increase activity!  -Look into physical therapy for ankles  -Set a walking date with peer support counselor   -Keep a symptom journal on your phone

## 2015-12-15 ENCOUNTER — Encounter: Payer: Self-pay | Admitting: Pulmonary Disease

## 2015-12-15 ENCOUNTER — Ambulatory Visit (INDEPENDENT_AMBULATORY_CARE_PROVIDER_SITE_OTHER): Payer: Medicare Other | Admitting: Pulmonary Disease

## 2015-12-15 VITALS — BP 130/74 | HR 80

## 2015-12-15 DIAGNOSIS — Z86711 Personal history of pulmonary embolism: Secondary | ICD-10-CM | POA: Diagnosis not present

## 2015-12-15 NOTE — Patient Instructions (Signed)
You're cleared from a pulmonary standpoint to have the rotator cuff operation.  Return to clinic in 3 months postop to re evaluate

## 2015-12-15 NOTE — Progress Notes (Addendum)
Karen [name]    [id number]    November 24, 1970  Primary Care Physician:Boyd, Leanora Cover, MD  Referring Physician:  Dr Lacretia Nicks Michel Harrow MD,  [facility] [address] [place], [place] [address]  Chief complaint: Preop clearance for rotator cuff repair.  HPI:  45 year old female with past mental history of hypertension, migraines, bipolar, anxiety. 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 As per the patient she was evaluated by a hematologist for clotting disorders and this workup was negative. She had another episode of DVT in 2010 while on the Coumadin. The dose of the Coumadin was increased however it had to be stopped after she developed epistaxis and bleeding.  She has remained stable since then. She's had several evaluations with a CTA in 2014, 2016 which was negative for PE. She was seen in the ED in June 2017 for chest pain and given Toradol, She then developed an episode of self limited hematemesis, hematochezia. In the clinic today she denies any bleeding, chest pain, palpitation, dyspnea, hemoptysis. She has intermittent lower extremity swelling.  Outpatient Encounter Prescriptions as of 12/15/2015  Medication Sig  . amLODipine (NORVASC) 2.5 MG tablet Take 2.5 mg by mouth daily.   . ARIPiprazole (ABILIFY MAINTENA) 400 MG SUSR Inject into the muscle.  . clonazePAM (KLONOPIN) 1 MG tablet Take 1 mg by mouth at bedtime as needed.  . cyclobenzaprine (FLEXERIL) 5 MG tablet Take 5 mg by mouth 2 (two) times daily as needed.  . furosemide (LASIX) 20 MG tablet Take 1 tablet (20 mg total) by mouth daily.  Marland Kitchen HYDROcodone-acetaminophen (NORCO/VICODIN) 5-325 MG tablet Take 1-2 tablets by mouth every 6 (six) hours as needed for moderate pain.  Marland Kitchen ipratropium (ATROVENT) 0.03 % nasal spray Place 2 sprays into the nose 3 (three) times daily as needed.  . linaclotide (LINZESS) 145 MCG CAPS capsule Take 145 mcg by mouth daily.  Marland Kitchen  olopatadine (PATANOL) 0.1 % ophthalmic solution Place 1 drop into both eyes daily as needed.  Marland Kitchen omeprazole (PRILOSEC) 40 MG capsule Take 1 capsule (40 mg total) by mouth 2 (two) times daily as needed (Upset stomach).  . Oxcarbazepine (TRILEPTAL) 300 MG tablet Take 300 mg by mouth 2 (two) times daily.  Marland Kitchen oxyCODONE-acetaminophen (PERCOCET) 10-325 MG tablet 1 tab by mouth every 6-8 hours as needed  . potassium chloride (K-DUR,KLOR-CON) 10 MEQ tablet Take 10 mEq by mouth daily.  . promethazine (PHENERGAN) 25 MG tablet Take 1 tablet (25 mg total) by mouth every 6 (six) hours as needed for nausea or vomiting.  . [DISCONTINUED] cyclobenzaprine (FLEXERIL) 5 MG tablet Take 5 mg by mouth 3 (three) times daily as needed for muscle spasms.  Marland Kitchen levETIRAcetam (KEPPRA) 750 MG tablet Take 750 mg by mouth 2 (two) times daily.   . [DISCONTINUED] ARIPiprazole (ABILIFY) 15 MG tablet Take 15 mg by mouth daily.  . [DISCONTINUED] methocarbamol (ROBAXIN) 500 MG tablet Take 1 tablet (500 mg total) by mouth every 8 (eight) hours as needed for muscle spasms. (Patient not taking: Reported on 04/23/2015)  . [DISCONTINUED] ondansetron (ZOFRAN ODT) 4 MG disintegrating tablet Take 1 tablet (4 mg total) by mouth every 8 (eight) hours as needed for nausea or vomiting. (Patient not taking: Reported on 12/15/2015)  . [DISCONTINUED] oxyCODONE-acetaminophen (PERCOCET) 7.5-325 MG tablet Take 1 tablet by mouth every 4 (four) hours as needed for moderate pain or severe pain. Reported on 08/04/2015  . [DISCONTINUED] oxyCODONE-acetaminophen (PERCOCET/ROXICET) 5-325 MG  tablet Take 1-2 tablets by mouth every 4 (four) hours as needed for moderate pain. (Patient not taking: Reported on 12/15/2015)  . [DISCONTINUED] traMADol (ULTRAM) 50 MG tablet Take 1 tablet (50 mg total) by mouth every 6 (six) hours as needed. (Patient not taking: Reported on 06/20/2015)  . [DISCONTINUED] venlafaxine (EFFEXOR) 37.5 MG tablet Take 37.5 mg by mouth 2 (two) times daily.    [name] facility-administered encounter medications on file as of 12/15/2015.     Allergies as of 12/15/2015 - Review Complete 12/15/2015  Allergen Reaction Noted  . Benadryl [diphenhydramine hcl] Hives and Shortness Of Breath 06/27/2012  . Buprenorphine hcl Shortness Of Breath and Rash 02/04/2015  . Morphine and related Shortness Of Breath and Rash 06/27/2012  . Lyrica [pregabalin] Swelling 02/04/2015  . Neurontin [gabapentin] Other (See Comments) 02/04/2015  . Aspirin Other (See Comments) 06/27/2012  . Sulfa antibiotics  06/27/2012  . Ciprofloxacin hcl Rash 06/27/2012    Past Medical History:  Diagnosis Date  . Anemia   . Anxiety   . Arthritis   . Bipolar affective disorder (HCC)   . Bowel obstruction (HCC)   . Depression   . DVT (deep venous thrombosis) (HCC)   . GERD (gastroesophageal reflux disease)   . Hypertension   . IBS (irritable bowel syndrome)   . Migraine   . PTSD (post-traumatic stress disorder)   . Pulmonary embolism [facility])     Past Surgical History:  Procedure Laterality Date  . ABDOMINAL HYSTERECTOMY    . ABDOMINAL SURGERY    . ANTERIOR CERVICAL DECOMP/DISCECTOMY FUSION N/A 03/04/2015   Procedure: ANTERIOR CERVICAL DECOMPRESSION/DISCECTOMY FUSION CERVICAL SIX-SEVEN;  Surgeon: Tia Alert, MD;  Location: [facility];  Service: Neurosurgery;  Laterality: N/A;  . APPENDECTOMY    . CHOLECYSTECTOMY    . KNEE ARTHROSCOPY    . TUBAL LIGATION      Family History  Problem Relation Age of Onset  . Rheum arthritis Mother   . Emphysema Father   . Heart disease Father   . Cancer Father     ? prostate  . Asthma Child     daughter    Social History   Social History  . Marital status: Single    Spouse name: N/A  . Number of children: N/A  . Years of education: N/A   Occupational History  . Not on file.   Social History Main Topics  . Smoking status: Former Smoker    Packs/day: 0.50    Years: 10.00    Types: Cigarettes    Quit date: 04/18/2012  .  Smokeless tobacco: Never Used  . Alcohol use Yes     Comment: not currently  . Drug use: [name]  . Sexual activity: [name]   Other Topics Concern  . Not on file   Social History Narrative   Married, does not live with spouse   3 children   On disability       Review of systems: Review of Systems  Constitutional: Negative for fever and chills.  HENT: Negative.   Eyes: Negative for blurred vision.  Respiratory: as per HPI  Cardiovascular: Negative for chest pain and palpitations.  Gastrointestinal: Negative for vomiting, diarrhea, blood per rectum. Genitourinary: Negative for dysuria, urgency, frequency and hematuria.  Musculoskeletal: Negative for myalgias, back pain and joint pain.  Skin: Negative for itching and rash.  Neurological: Negative for dizziness, tremors, focal weakness, seizures and loss of consciousness.  Endo/Heme/Allergies: Negative for environmental allergies.  Psychiatric/Behavioral: Negative for depression, suicidal  ideas and hallucinations.  All other systems reviewed and are negative.   Physical Exam: Blood pressure 130/74, pulse 80, SpO2 100 %. Gen:      [name] acute distress HEENT:  EOMI, sclera anicteric Neck:     [name] masses; [name] thyromegaly Lungs:    Clear to auscultation bilaterally; normal respiratory effort CV:         Regular rate and rhythm; [name] murmurs Abd:      + bowel sounds; soft, non-tender; [name] palpable masses, [name] distension Ext:    [name] edema; adequate peripheral perfusion Skin:      Warm and dry; [name] rash Neuro: alert and oriented x 3 Psych: normal mood and affect  Data Reviewed: CTA 04/18/15- [name] evidence of PE CTA 06/27/12- [name] evidence of PE  CXR 09/23/15- [name] active cardiopulmonary disease  D dimer 04/23/15- 0.30 (negative)  Assessment:  Mrs Charissa BashMbaye had 2 episodes of apparently unprovoked PE/DVT in 2006 and 2010. Work up for hypercoagulable state was negative as per the pt. She has been asymptomatic off coumadin with [name] recurrence of thromboembolism.  I don't believe she needs to be on anticoagulation especially if she had an episode of GI bleed recently.  She should be able to tolerate the arthroscopic rotator cuff repair as it is a same day procedure. She is cleared from our perspective to go ahead with the surgery.  Plan/Recommendations: - Cleared to go ahead with rotator cuff repair.  Return to clinic in 3 months.  Chilton GreathousePraveen Angelmarie Ponzo MD [facility] Pager [phone] 12/15/2015, 2:54 PM   CC: Verlon AuBoyd, Tammy Lamonica, MD Azucena KubaW Dan Cafferey MD

## 2015-12-25 ENCOUNTER — Ambulatory Visit: Payer: Self-pay | Admitting: Dietician

## 2016-01-12 ENCOUNTER — Ambulatory Visit: Payer: Medicare Other | Attending: Orthopedic Surgery | Admitting: Physical Therapy

## 2016-01-12 DIAGNOSIS — R600 Localized edema: Secondary | ICD-10-CM

## 2016-01-12 DIAGNOSIS — M25611 Stiffness of right shoulder, not elsewhere classified: Secondary | ICD-10-CM | POA: Insufficient documentation

## 2016-01-12 DIAGNOSIS — R293 Abnormal posture: Secondary | ICD-10-CM | POA: Diagnosis present

## 2016-01-12 DIAGNOSIS — M25511 Pain in right shoulder: Secondary | ICD-10-CM | POA: Diagnosis present

## 2016-01-12 NOTE — Therapy (Signed)
[facility] [address] [place], [place], [address] Phone: [phone]   Fax:  [phone]  Physical Therapy Evaluation  Patient Details  Name: Karen Cleaverricka Billard MRN: [id number] Date of Birth: 09/15/1970 Referring Provider: Baltazar Apoan Caffery Md  Encounter Date: 01/12/2016      PT End of Session - 01/12/16 1326    Visit Number 1   Number of Visits 17   Date for PT Re-Evaluation 03/08/16   Authorization Type Medicare, Kx mod by 15th visit, progress note by 10th visit.    PT Start Time [phone]   PT Stop Time 0932   PT Time Calculation (min) 42 min   Activity Tolerance Patient tolerated treatment well   Behavior During Therapy WFL for tasks assessed/performed      Past Medical History:  Diagnosis Date  . Anemia   . Anxiety   . Arthritis   . Bipolar affective disorder (HCC)   . Bowel obstruction (HCC)   . Depression   . DVT (deep venous thrombosis) (HCC)   . GERD (gastroesophageal reflux disease)   . Hypertension   . IBS (irritable bowel syndrome)   . Migraine   . PTSD (post-traumatic stress disorder)   . Pulmonary embolism [facility](HCC)     Past Surgical History:  Procedure Laterality Date  . ABDOMINAL HYSTERECTOMY    . ABDOMINAL SURGERY    . ANTERIOR CERVICAL DECOMP/DISCECTOMY FUSION N/A 03/04/2015   Procedure: ANTERIOR CERVICAL DECOMPRESSION/DISCECTOMY FUSION CERVICAL SIX-SEVEN;  Surgeon: Tia Alertavid S Jones, MD;  Location: [facility];  Service: Neurosurgery;  Laterality: N/A;  . APPENDECTOMY    . CHOLECYSTECTOMY    . KNEE ARTHROSCOPY    . TUBAL LIGATION      There were no vitals filed for this visit.       Subjective Assessment - 01/12/16 0858    Subjective pt is a 45 y.o F s/p R shoulder Rotator cuff repair on 01/06/2016. since surgery pt reports constant pain, and has been having to sleep in a chair. hast to stay consistent with her pain medication. Pain stays mostly in the shoulder and feels like someone is taking the stiches  and pulling them.   Limitations Lifting;Standing;Walking;House hold activities;Sitting   How long can you sit comfortably? 10-15 min   How long can you stand comfortably? 10 min   How long can you walk comfortably? 5-10 min   Diagnostic tests MRI prior to surgery   Patient Stated Goals get shoulder back to normal, reduce pain, doing hair/ getting dressed,   Currently in Pain? Yes   Pain Score 8   took pain meds at 8:30 am   Pain Location Shoulder   Pain Orientation Right   Pain Descriptors / Indicators Burning;Aching  pulling   Pain Type Surgical pain   Pain Onset In the past 7 days   Pain Frequency Constant   Aggravating Factors  movement, prolonged positioning,    Pain Relieving Factors pain medication, ice,             [facility] PT Assessment - 01/12/16 0911      Assessment   Medical Diagnosis R rotator cuff repair    Referring Provider Baltazar Apoan Caffery Md   Onset Date/Surgical Date 01/06/16   Hand Dominance Right   Next MD Visit 01/12/2016   Prior Therapy yes     Precautions   Precautions Shoulder   Precaution Comments no lifting, moving, or activities, gave handout     Restrictions   Weight Bearing Restrictions Yes  RUE Weight Bearing Non weight bearing     Balance Screen   Has the patient fallen in the past 6 months Yes   How many times? 2   Has the patient had a decrease in activity level because of a fear of falling?  No   Is the patient reluctant to leave their home because of a fear of falling?  No     Home Environment   Living Environment Private residence   Living Arrangements Alone   Type of Home Apartment   Home Access Stairs to enter   Entrance Stairs-Number of Steps 14   Entrance Stairs-Rails Right   Home Layout One level     Prior Function   Level of Independence Needs assistance with homemaking;Needs assistance with ADLs   Dressing Moderate   Grooming Moderate   Meal Prep Moderate   Vocation On disability     Observation/Other Assessments    Focus on Therapeutic Outcomes (FOTO)  68% limited  predicted 46%limited     Posture/Postural Control   Posture/Postural Control Postural limitations   Postural Limitations Rounded Shoulders;Forward head     ROM / Strength   AROM / PROM / Strength PROM;Strength;AROM     AROM   AROM Assessment Site Shoulder   Right/Left Shoulder Right;Left   Left Shoulder Extension 45 Degrees   Left Shoulder Flexion 155 Degrees   Left Shoulder ABduction 122 Degrees   Left Shoulder Internal Rotation 71 Degrees   Left Shoulder External Rotation 83 Degrees     PROM   Overall PROM  Due to pain;Due to precautions   PROM Assessment Site Shoulder   Right/Left Shoulder Right   Right Shoulder Extension 8 Degrees   Right Shoulder Flexion 15 Degrees   Right Shoulder ABduction 5 Degrees   Right Shoulder Internal Rotation --  to stomach   Right Shoulder External Rotation 8 Degrees     Strength   Overall Strength Comments R shoulder not tested due to precuations   Strength Assessment Site Shoulder;Hand   Right/Left Shoulder Right;Left   Left Shoulder Flexion 4-/5   Left Shoulder Extension 4-/5   Left Shoulder ABduction 3+/5   Left Shoulder Internal Rotation 4-/5   Left Shoulder External Rotation 4-/5   Right Hand Grip (lbs) 2.6  4,2,2   Left Hand Grip (lbs) 37  41,33,37     Palpation   Palpation comment soreness noted globally around the shoulder with pain specifically in the laterall deltoid, and around the incision site                           PT Education - 01/12/16 1324    Education provided Yes   Education Details evaluation findings, POC, goals, anatomy of the rotator cuff using scapular model, HEP with proper form and treatment rationale, avoid actively contracting R shoulder muscles.    Person(s) Educated Patient;Child(ren)  son   Methods Explanation;Demonstration;Verbal cues;Handout   Comprehension Verbalized understanding;Returned demonstration;Verbal cues  required          PT Short Term Goals - 01/12/16 1331      PT SHORT TERM GOAL #1   Title pt will be I with inital HEP (02/11/2016)   Time 4   Period Weeks   Status New     PT SHORT TERM GOAL #2   Title pt will be able to verbalize and demo techniques to reduce R shoulder pain and inflammation via RICE (02/11/2016)   Time 4  Period Weeks   Status New     PT SHORT TERM GOAL #3   Title pt will improve R grip strength to >/= 15 # to demonstrating improving R shoulder function (02/11/2016)   Time 4   Period Weeks   Status New     PT SHORT TERM GOAL #4   Title pt will improve R shoulder PROM/ AAROM flexion to >/= 100 degrees, abduction to >/= 90 degrees, ER/IR to >/= 45 degrees with </= 6/10 pain to promote functional mobility (02/11/2016)   Time 4   Period Weeks   Status New           PT Long Term Goals - 01/12/16 1334      PT LONG TERM GOAL #1   Title pt will be I with all HEP as of last visit (03/08/2016)   Time 8   Period Weeks   Status New     PT LONG TERM GOAL #2   Title pt will improve R shoulder AROM flexion to >/= 120 degrees and abduction to >/= 100 degrees and ER/ IR >/= 60 degrees with </= 3/10 pain for donning/ doffing clothing, and grooming/doing her hair (03/08/2016)   Time 8   Period Weeks   Status New     PT LONG TERM GOAL #3   Title pt will improve R shoulder strength to >/= 4-/5 in all planes with </= 3/10 pain for funcitonal lifting and carrying activities (03/08/2016)   Time 8   Period Weeks   Status New     PT LONG TERM GOAL #4   Title pt will be able to lift and carrying >/= 8# as well as lift and lower from shelf / cabinet at or above head height with </= 3/10 pain for functional ADLS (03/08/2016)   Time 8   Period Weeks     PT LONG TERM GOAL #5   Title pt will improve her FOTO score to </= 50% limited to demontrate increaes in function at discharge (03/08/2016)   Baseline 68% limited   Time 6   Period Weeks   Status New                Plan - 01/12/16 1327    Clinical Impression Statement pt presents to OPPT as a moderate complexity evaluation based on PMHx, limited mobilty, and personal factors. pt is s/p R RC repair on 01/06/2016. she demos limited PROM in all planes secondary to signifcant guarding and pain, with significant apprehension due to passive movement. tightness and soreness globally in the shoulder and around the incision site. reviewed HEP with specific instruction on avoiding activit contraction of the muscles to avoid injuring muscles and the surgery. pt would benefit from physical therapy to decreaes pain, improve mobility/ strength and maxize her function by addressing the defecits listed.    Rehab Potential Good   PT Frequency 3x / week   PT Duration 8 weeks   PT Treatment/Interventions ADLs/Self Care Home Management;Cryotherapy;Electrical Stimulation;Iontophoresis 4mg /ml Dexamethasone;Moist Heat;Ultrasound;Passive range of motion;Patient/family education;Vasopneumatic Device;Scar mobilization;Therapeutic exercise;Therapeutic activities;Manual techniques;Taping   PT Next Visit Plan assess/ response to HEP, PROM, scapular retraction, modalities PRN for pain, did protocol get sent from MD?    PT Home Exercise Plan pendulums, table slides, ball squeezes, scapular retractions   Consulted and Agree with Plan of Care Patient      Patient will benefit from skilled therapeutic intervention in order to improve the following deficits and impairments:  Pain, Impaired flexibility, Improper body mechanics,  Postural dysfunction, Decreased endurance, Decreased range of motion, Increased edema, Decreased strength, Increased fascial restricitons, Increased muscle spasms, Decreased activity tolerance, Decreased mobility, Decreased scar mobility  Visit Diagnosis: Pain in right shoulder - Plan: PT plan of care cert/re-cert  Stiffness of right shoulder, not elsewhere classified - Plan: PT plan of care  cert/re-cert  Localized edema - Plan: PT plan of care cert/re-cert  Abnormal posture - Plan: PT plan of care cert/re-cert      G-Codes - January 24, 2016 1347    Functional Assessment Tool Used FOTO / clinical judgement   Functional Limitation Carrying, moving and handling objects   Carrying, Moving and Handling Objects Current Status (Y7829) At least 60 percent but less than 80 percent impaired, limited or restricted   Carrying, Moving and Handling Objects Goal Status (F6213) At least 40 percent but less than 60 percent impaired, limited or restricted       Problem List Patient Active Problem List   Diagnosis Date Noted  . History of pulmonary embolus (PE) 12/15/2015  . S/P cervical spinal fusion 03/04/2015  . Obstructive apnea 12/08/2014  . 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 Tingling 08/19/2014  . Limb weakness 08/19/2014  . Muscle weakness (generalized) 08/19/2014  . Muscle ache 07/25/2014  . Arthralgia of multiple joints 07/24/2014  . Encounter for general adult medical examination without abnormal findings 07/24/2014  . Migraine without aura and responsive to treatment 06/09/2014  . Abdominal pain, generalized 01/31/2014  . Chronic headache 01/30/2014  . Cannot sleep 06/24/2013  . 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 Essential (primary) hypertension 05/13/2013  . Digestive disorder 05/13/2013  . H/O injury, presenting hazards to health 05/13/2013  . Bipolar affective disorder (HCC)   . Bipolar I disorder, most recent episode depressed (HCC) 11/15/2012  . PTSD (post-traumatic stress disorder) 11/15/2012   Lulu Riding PT, DPT, LAT, ATC  January 24, 2016  1:49 PM      [facility] [address] [place], [place], [address] Phone: [phone]   Fax:  [phone]  Name: Dung Prien MRN: [id number] Date of  Birth: 05-28-1970

## 2016-01-13 ENCOUNTER — Ambulatory Visit: Payer: Medicare Other | Admitting: Physical Therapy

## 2016-01-13 DIAGNOSIS — M25511 Pain in right shoulder: Secondary | ICD-10-CM | POA: Diagnosis not present

## 2016-01-13 DIAGNOSIS — M25611 Stiffness of right shoulder, not elsewhere classified: Secondary | ICD-10-CM

## 2016-01-13 DIAGNOSIS — R293 Abnormal posture: Secondary | ICD-10-CM

## 2016-01-13 DIAGNOSIS — R600 Localized edema: Secondary | ICD-10-CM

## 2016-01-13 NOTE — Therapy (Signed)
[facility] [address] [place], [place], [address] Phone: [phone]   Fax:  [phone]  Physical Therapy Treatment  Patient Details  Name: Karen [name] MRN: [id number] Date of Birth: 06/02/1970 Referring Provider: Baltazar Apoan Caffery Md  Encounter Date: 01/13/2016      PT End of Session - 01/13/16 1130    Visit Number 2   Number of Visits 17   Date for PT Re-Evaluation 03/08/16   Authorization Type Medicare, Kx mod by 15th visit, progress note by 10th visit.    PT Start Time 1118  18 minutes late   PT Stop Time 1155   PT Time Calculation (min) 37 min      Past Medical History:  Diagnosis Date  . Anemia   . Anxiety   . Arthritis   . Bipolar affective disorder (HCC)   . Bowel obstruction (HCC)   . Depression   . DVT (deep venous thrombosis) (HCC)   . GERD (gastroesophageal reflux disease)   . Hypertension   . IBS (irritable bowel syndrome)   . Migraine   . PTSD (post-traumatic stress disorder)   . Pulmonary embolism [facility](HCC)     Past Surgical History:  Procedure Laterality Date  . ABDOMINAL HYSTERECTOMY    . ABDOMINAL SURGERY    . ANTERIOR CERVICAL DECOMP/DISCECTOMY FUSION N/A 03/04/2015   Procedure: ANTERIOR CERVICAL DECOMPRESSION/DISCECTOMY FUSION CERVICAL SIX-SEVEN;  Surgeon: Tia Alertavid S Jones, MD;  Location: [facility];  Service: Neurosurgery;  Laterality: N/A;  . APPENDECTOMY    . CHOLECYSTECTOMY    . KNEE ARTHROSCOPY    . TUBAL LIGATION      There were no vitals filed for this visit.      Subjective Assessment - 01/13/16 1129    Subjective It's hurting. i tried the shoulder blade squeezes   Currently in Pain? Yes   Pain Score 8    Pain Location Shoulder   Pain Orientation Right            [facility] PT Assessment - 01/13/16 0001      PROM   Right Shoulder Flexion 90 Degrees   Right Shoulder ABduction 60 Degrees   Right Shoulder Internal Rotation --  to stomach   Right Shoulder External Rotation 30  Degrees                     [facility] Adult PT Treatment/Exercise - 01/13/16 0001      Shoulder Exercises: Seated   Retraction 10 reps   Retraction Limitations and small shoulder rolls each way   Other Seated Exercises elbow flexion x 20      Shoulder Exercises: Standing   Other Standing Exercises Pendulum      Modalities   Modalities Cryotherapy     Cryotherapy   Number Minutes Cryotherapy 10 Minutes   Cryotherapy Location Shoulder   Type of Cryotherapy Ice pack     Manual Therapy   Manual Therapy Passive ROM   Passive ROM Flexion, abduction, ER                 PT Short Term Goals - 01/12/16 1331      PT SHORT TERM GOAL #1   Title pt will be I with inital HEP (02/11/2016)   Time 4   Period Weeks   Status New     PT SHORT TERM GOAL #2   Title pt will be able to verbalize and demo techniques to reduce R shoulder pain and inflammation via  RICE (02/11/2016)   Time 4   Period Weeks   Status New     PT SHORT TERM GOAL #3   Title pt will improve R grip strength to >/= 15 # to demonstrating improving R shoulder function (02/11/2016)   Time 4   Period Weeks   Status New     PT SHORT TERM GOAL #4   Title pt will improve R shoulder PROM/ AAROM flexion to >/= 100 degrees, abduction to >/= 90 degrees, ER/IR to >/= 45 degrees with </= 6/10 pain to promote functional mobility (02/11/2016)   Time 4   Period Weeks   Status New           PT Long Term Goals - 01-15-2016 1334      PT LONG TERM GOAL #1   Title pt will be I with all HEP as of last visit (03/08/2016)   Time 8   Period Weeks   Status New     PT LONG TERM GOAL #2   Title pt will improve R shoulder AROM flexion to >/= 120 degrees and abduction to >/= 100 degrees and ER/ IR >/= 60 degrees with </= 3/10 pain for donning/ doffing clothing, and grooming/doing her hair (03/08/2016)   Time 8   Period Weeks   Status New     PT LONG TERM GOAL #3   Title pt will improve R shoulder strength to  >/= 4-/5 in all planes with </= 3/10 pain for funcitonal lifting and carrying activities (03/08/2016)   Time 8   Period Weeks   Status New     PT LONG TERM GOAL #4   Title pt will be able to lift and carrying >/= 8# as well as lift and lower from shelf / cabinet at or above head height with </= 3/10 pain for functional ADLS (03/08/2016)   Time 8   Period Weeks     PT LONG TERM GOAL #5   Title pt will improve her FOTO score to </= 50% limited to demontrate increaes in function at discharge (03/08/2016)   Baseline 68% limited   Time 6   Period Weeks   Status New               Plan - 01/13/16 1455    Clinical Impression Statement Pt arrives 18 minutes late. She reports 8/10 pain. SHe has tried only the shoulder blade squeezes. Review of HEP and importance of exercises to remain passive. Pt able to demonstrate proper pendulum and reach 90 degrees of shoulder flexion in pendulum position. Review of table slide with max cues to make it passive. PROM greatly improved since yesterday. Easily abe to reach 90 degrees of flexion and 30 degrees of external rotaiton.    PT Next Visit Plan assess/ response to HEP, PROM, scapular retraction, modalities PRN for pain, did protocol get sent from MD?    PT Home Exercise Plan pendulums, table slides, ball squeezes, scapular retractions      Patient will benefit from skilled therapeutic intervention in order to improve the following deficits and impairments:  Pain, Impaired flexibility, Improper body mechanics, Postural dysfunction, Decreased endurance, Decreased range of motion, Increased edema, Decreased strength, Increased fascial restricitons, Increased muscle spasms, Decreased activity tolerance, Decreased mobility, Decreased scar mobility  Visit Diagnosis: No diagnosis found.       G-Codes - 2016-01-15 1347    Functional Assessment Tool Used FOTO / clinical judgement   Functional Limitation Carrying, moving and handling objects   Carrying,  Moving and Handling Objects Current Status [phone]) At least 60 percent but less than 80 percent impaired, limited or restricted   Carrying, Moving and Handling Objects Goal Status ([id number]) At least 40 percent but less than 60 percent impaired, limited or restricted      Problem List Patient Active Problem List   Diagnosis Date Noted  . History of pulmonary embolus (PE) 12/15/2015  . S/P cervical spinal fusion 03/04/2015  . Obstructive apnea 12/08/2014  . 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 Tingling 08/19/2014  . Limb weakness 08/19/2014  . Muscle weakness (generalized) 08/19/2014  . Muscle ache 07/25/2014  . Arthralgia of multiple joints 07/24/2014  . Encounter for general adult medical examination without abnormal findings 07/24/2014  . Migraine without aura and responsive to treatment 06/09/2014  . Abdominal pain, generalized 01/31/2014  . Chronic headache 01/30/2014  . Cannot sleep 06/24/2013  . 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 Essential (primary) hypertension 05/13/2013  . Digestive disorder 05/13/2013  . H/O injury, presenting hazards to health 05/13/2013  . Bipolar affective disorder (HCC)   . Bipolar I disorder, most recent episode depressed (HCC) 11/15/2012  . PTSD (post-traumatic stress disorder) 11/15/2012    Sherrie Mustache, PTA 01/13/2016, 3:02 PM  [facility] [address] [place], [place], [address] Phone: [phone]   Fax:  [phone]  Name: Karen [name] MRN: [id number] Date of Birth: 1971/02/17

## 2016-01-14 ENCOUNTER — Ambulatory Visit: Payer: Medicare Other | Admitting: Physical Therapy

## 2016-01-19 ENCOUNTER — Emergency Department (HOSPITAL_BASED_OUTPATIENT_CLINIC_OR_DEPARTMENT_OTHER)
Admit: 2016-01-19 | Discharge: 2016-01-19 | Disposition: A | Discharge status: Home / Self Care | Payer: Medicare Other | Attending: Emergency Medicine | Admitting: Emergency Medicine

## 2016-01-19 ENCOUNTER — Emergency Department (HOSPITAL_COMMUNITY)
Admission: EM | Admit: 2016-01-19 | Discharge: 2016-01-19 | Disposition: A | Discharge status: Home / Self Care | Payer: Medicare Other | Attending: Emergency Medicine | Admitting: Emergency Medicine

## 2016-01-19 ENCOUNTER — Ambulatory Visit: Payer: Medicare Other | Attending: Orthopedic Surgery | Admitting: Physical Therapy

## 2016-01-19 ENCOUNTER — Encounter (HOSPITAL_COMMUNITY): Payer: Self-pay | Admitting: Emergency Medicine

## 2016-01-19 DIAGNOSIS — M79609 Pain in unspecified limb: Secondary | ICD-10-CM | POA: Diagnosis not present

## 2016-01-19 DIAGNOSIS — R293 Abnormal posture: Secondary | ICD-10-CM | POA: Diagnosis present

## 2016-01-19 DIAGNOSIS — Z79899 Other long term (current) drug therapy: Secondary | ICD-10-CM | POA: Insufficient documentation

## 2016-01-19 DIAGNOSIS — M6281 Muscle weakness (generalized): Secondary | ICD-10-CM | POA: Diagnosis present

## 2016-01-19 DIAGNOSIS — M25511 Pain in right shoulder: Secondary | ICD-10-CM

## 2016-01-19 DIAGNOSIS — I10 Essential (primary) hypertension: Secondary | ICD-10-CM | POA: Insufficient documentation

## 2016-01-19 DIAGNOSIS — Z87891 Personal history of nicotine dependence: Secondary | ICD-10-CM | POA: Diagnosis not present

## 2016-01-19 DIAGNOSIS — R600 Localized edema: Secondary | ICD-10-CM | POA: Diagnosis present

## 2016-01-19 DIAGNOSIS — M7989 Other specified soft tissue disorders: Secondary | ICD-10-CM | POA: Diagnosis present

## 2016-01-19 DIAGNOSIS — M25611 Stiffness of right shoulder, not elsewhere classified: Secondary | ICD-10-CM | POA: Diagnosis present

## 2016-01-19 LAB — I-STAT CHEM 8, ED
BUN: 8 mg/dL (ref 6–20)
CALCIUM ION: 1.07 mmol/L — AB (ref 1.15–1.40)
Chloride: 104 mmol/L (ref 101–111)
Creatinine, Ser: 0.6 mg/dL (ref 0.44–1.00)
GLUCOSE: 89 mg/dL (ref 65–99)
HCT: 35 % — ABNORMAL LOW (ref 36.0–46.0)
HEMOGLOBIN: 11.9 g/dL — AB (ref 12.0–15.0)
POTASSIUM: 4 mmol/L (ref 3.5–5.1)
Sodium: 137 mmol/L (ref 135–145)
TCO2: 24 mmol/L (ref 0–100)

## 2016-01-19 NOTE — ED Provider Notes (Signed)
[facility]PT Provider Note   CSN: [id number] Arrival date & time: 01/19/16  1156     History   Chief Complaint Chief Complaint  Patient presents with  . Leg Swelling    HPI Karen [name] is a 45 y.o. female.  HPI Karen [name] is a 45 y.o. female with history of anemia, bipolar disorder, depression, prior DVTs, prior pulmonary embolism, anxiety, presents to emergency department complaining of bilateral leg swelling. Patient states she has history of lower extremity edema for which she takes Lasix. She states she had right shoulder surgery for rotator cuff repair a week and a half ago. She states since then she has had increased swelling in her legs. She states she hasn't been as active since her surgery. She still taking her Lasix and elevating her legs. Patient has been sleeping in a recliner since her surgery. She reports pain in bilateral legs from swelling. She reports history of DVTs, so decided to be evaluated. She is currently not on any anticoagulation. Denies fever or chills. Denies any injuries. Denies any other complaints.  Past Medical History:  Diagnosis Date  . Anemia   . Anxiety   . Arthritis   . Bipolar affective disorder (HCC)   . Bowel obstruction   . Depression   . DVT (deep venous thrombosis) (HCC)   . GERD (gastroesophageal reflux disease)   . Hypertension   . IBS (irritable bowel syndrome)   . Migraine   . PTSD (post-traumatic stress disorder)   . Pulmonary embolism [facility](HCC)     Patient Active Problem List   Diagnosis Date Noted  . History of pulmonary embolus (PE) 12/15/2015  . S/P cervical spinal fusion 03/04/2015  . Obstructive apnea 12/08/2014  . 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 Tingling 08/19/2014  . Limb weakness 08/19/2014  . Muscle weakness (generalized) 08/19/2014  . Muscle ache 07/25/2014  . Arthralgia of multiple joints 07/24/2014  . Encounter for general adult medical examination without abnormal findings 07/24/2014  .  Migraine without aura and responsive to treatment 06/09/2014  . Abdominal pain, generalized 01/31/2014  . Chronic headache 01/30/2014  . Cannot sleep 06/24/2013  . 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 Essential (primary) hypertension 05/13/2013  . Digestive disorder 05/13/2013  . H/O injury, presenting hazards to health 05/13/2013  . Bipolar affective disorder (HCC)   . Bipolar I disorder, most recent episode depressed (HCC) 11/15/2012  . PTSD (post-traumatic stress disorder) 11/15/2012    Past Surgical History:  Procedure Laterality Date  . ABDOMINAL HYSTERECTOMY    . ABDOMINAL SURGERY    . ANTERIOR CERVICAL DECOMP/DISCECTOMY FUSION N/A 03/04/2015   Procedure: ANTERIOR CERVICAL DECOMPRESSION/DISCECTOMY FUSION CERVICAL SIX-SEVEN;  Surgeon: Tia Alertavid S Jones, MD;  Location: [facility];  Service: Neurosurgery;  Laterality: N/A;  . APPENDECTOMY    . CHOLECYSTECTOMY    . KNEE ARTHROSCOPY    . ROTATOR CUFF REPAIR    . TUBAL LIGATION      OB History    No data available       Home Medications    Prior to Admission medications   Medication Sig Start Date End Date Taking? Authorizing Provider  amLODipine (NORVASC) 2.5 MG tablet Take 2.5 mg by mouth daily.  07/24/14   Historical Provider, MD  ARIPiprazole (ABILIFY MAINTENA) 400 MG SUSR Inject into the muscle.    Historical Provider, MD  clonazePAM (KLONOPIN) 1 MG tablet Take 1  mg by mouth at bedtime as needed. 11/30/15   Historical Provider, MD  cyclobenzaprine (FLEXERIL) 5 MG tablet Take 5 mg by mouth 2 (two) times daily as needed. 11/26/15   Historical Provider, MD  furosemide (LASIX) 20 MG tablet Take 1 tablet (20 mg total) by mouth daily. 04/23/15   Lorre Nick, MD  HYDROcodone-acetaminophen (NORCO/VICODIN) 5-325 MG tablet Take 1-2 tablets by mouth every 6 (six) hours as needed for moderate pain. 06/20/15   Vanetta Mulders, MD    ipratropium (ATROVENT) 0.03 % nasal spray Place 2 sprays into the nose 3 (three) times daily as needed. 09/08/15 09/07/16  Historical Provider, MD  levETIRAcetam (KEPPRA) 750 MG tablet Take 750 mg by mouth 2 (two) times daily.  11/17/14 11/17/15  Historical Provider, MD  linaclotide Karlene Einstein) 145 MCG CAPS capsule Take 145 mcg by mouth daily. 09/16/15   Historical Provider, MD  olopatadine (PATANOL) 0.1 % ophthalmic solution Place 1 drop into both eyes daily as needed. 09/08/15 09/07/16  Historical Provider, MD  omeprazole (PRILOSEC) 40 MG capsule Take 1 capsule (40 mg total) by mouth 2 (two) times daily as needed (Upset stomach). 12/10/12   Thermon Leyland, NP  Oxcarbazepine (TRILEPTAL) 300 MG tablet Take 300 mg by mouth 2 (two) times daily. 11/30/15   Historical Provider, MD  oxyCODONE-acetaminophen (PERCOCET) 10-325 MG tablet 1 tab by mouth every 6-8 hours as needed    Historical Provider, MD  potassium chloride (K-DUR,KLOR-CON) 10 MEQ tablet Take 10 mEq by mouth daily. 04/29/15 04/28/16  Historical Provider, MD  promethazine (PHENERGAN) 25 MG tablet Take 1 tablet (25 mg total) by mouth every 6 (six) hours as needed for nausea or vomiting. 06/20/15   Vanetta Mulders, MD    Family History Family History  Problem Relation Age of Onset  . Rheum arthritis Mother   . Emphysema Father   . Heart disease Father   . Cancer Father     ? prostate  . Asthma Child     daughter    Social History Social History  Substance Use Topics  . Smoking status: Former Smoker    Packs/day: 0.50    Years: 10.00    Types: Cigarettes    Quit date: 04/18/2012  . Smokeless tobacco: Never Used  . Alcohol use No     Comment: not currently     Allergies   Benadryl [diphenhydramine hcl]; Buprenorphine hcl; Morphine and related; Lyrica [pregabalin]; Neurontin [gabapentin]; Aspirin; Sulfa antibiotics; and Ciprofloxacin hcl   Review of Systems Review of Systems  Constitutional: Negative for chills and fever.  Respiratory:  Negative for cough, chest tightness and shortness of breath.   Cardiovascular: Positive for leg swelling. Negative for chest pain and palpitations.  Gastrointestinal: Negative for abdominal pain, diarrhea, nausea and vomiting.  Genitourinary: Negative for dysuria, flank pain and pelvic pain.  Musculoskeletal: Negative for arthralgias, myalgias, neck pain and neck stiffness.  Skin: Negative for rash.  Neurological: Negative for dizziness, weakness and headaches.  All other systems reviewed and are negative.    Physical Exam Updated Vital Signs BP 134/94 (BP Location: Left Arm)   Pulse 89   Temp 98.2 F (36.8 C) (Oral)   Resp 18   Ht 5\' 4"  (1.626 m)   Wt 87.2 kg   SpO2 100%   BMI 32.99 kg/m   Physical Exam  Constitutional: She appears well-developed and well-nourished. No distress.  HENT:  Head: Normocephalic.  Eyes: Conjunctivae are normal.  Neck: Neck supple.  Cardiovascular: Normal rate, regular rhythm and  normal heart sounds.   Pulmonary/Chest: Effort normal and breath sounds normal. No respiratory distress. She has no wheezes. She has no rales.  Abdominal: Soft. Bowel sounds are normal. She exhibits no distension. There is no tenderness. There is no rebound.  Musculoskeletal: She exhibits no edema.  1+ pitting lower extremity swelling in bilateral feet and lower shins. Dorsal pedal pulses intact and equal bilaterally. Cap refill less than 2 seconds distally. Diffuse tenderness to palpation of the bilateral ankles and feet. No erythema, swelling, bruising.  Neurological: She is alert.  Skin: Skin is warm and dry.  Psychiatric: She has a normal mood and affect. Her behavior is normal.  Nursing note and vitals reviewed.    ED Treatments / Results  Labs (all labs ordered are listed, but only abnormal results are displayed) Labs Reviewed  I-STAT CHEM 8, ED - Abnormal; Notable for the following:       Result Value   Calcium, Ion 1.07 (*)    Hemoglobin 11.9 (*)    HCT  35.0 (*)    All other components within normal limits    EKG  EKG Interpretation None       Radiology No results found.  Procedures Procedures (including critical care time) Gerarda Gunther Sturdivant  Vascular Lab      Preliminary results by tech - Venous Duplex Lower Ext Completed. Negative for deep and superficial vein thrombosis in both legs. Marilynne Halsted, BS, RDMS, RVT      Medications Ordered in ED Medications - No data to display   Initial Impression / Assessment and Plan / ED Course  I have reviewed the triage vital signs and the nursing notes.  Pertinent labs & imaging results that were available during my care of the patient were reviewed by me and considered in my medical decision making (see chart for details).  Clinical Course   Pt in ED with bilateral LE edema worsening after right shoulder surgery 1.5wks ago. On exam, bilateral lowers extremity swelling noted. No evidence of infectious process. Patient does have tenderness throughout her lower shins, ankles, feet. Bilateral venous Doppler showed no evidence of deep or superficial vein thrombosis. Will check renal function. Suspect lower extremity edema from sleeping in the recliner and decreased activity since her surgery. No chest pain or shortness of breath. Normal vital signs in ED.  3:23 PM Normal renal function. Plan to dc home. Continue to elevate legs. Try compression stockings. Try to walk/move around more. Follow up with her primary care doctor.   Vitals:   01/19/16 1211 01/19/16 1214  BP:  134/94  Pulse:  89  Resp:  18  Temp:  98.2 F (36.8 C)  TempSrc:  Oral  SpO2:  100%  Weight: 89.9 kg 87.2 kg  Height: 5\' 4"  (1.626 m) 5\' 4"  (1.626 m)     Final Clinical Impressions(s) / ED Diagnoses   Final diagnoses:  Bilateral lower extremity edema    New Prescriptions New Prescriptions   No medications on file     Jaynie Crumble, PA-C 01/19/16 1525    Laurence Spates, MD 01/19/16  1649

## 2016-01-19 NOTE — Therapy (Signed)
[facility] [address] [place], [place], [address] Phone: [phone]   Fax:  [phone]  Physical Therapy Treatment  Patient Details  Name: Karen [name] MRN: [id number] Date of Birth: 1970-06-22 Referring Provider: Baltazar Apo Md  Encounter Date: 01/19/2016      PT End of Session - 01/19/16 1138    Visit Number 3   Number of Visits 17   Date for PT Re-Evaluation 03/08/16   Authorization Type Medicare, Kx mod by 15th visit, progress note by 10th visit.    PT Start Time 1017   PT Stop Time 1110   PT Time Calculation (min) 53 min   Activity Tolerance Patient tolerated treatment well   Behavior During Therapy WFL for tasks assessed/performed      Past Medical History:  Diagnosis Date  . Anemia   . Anxiety   . Arthritis   . Bipolar affective disorder (HCC)   . Bowel obstruction   . Depression   . DVT (deep venous thrombosis) (HCC)   . GERD (gastroesophageal reflux disease)   . Hypertension   . IBS (irritable bowel syndrome)   . Migraine   . PTSD (post-traumatic stress disorder)   . Pulmonary embolism [facility])     Past Surgical History:  Procedure Laterality Date  . ABDOMINAL HYSTERECTOMY    . ABDOMINAL SURGERY    . ANTERIOR CERVICAL DECOMP/DISCECTOMY FUSION N/A 03/04/2015   Procedure: ANTERIOR CERVICAL DECOMPRESSION/DISCECTOMY FUSION CERVICAL SIX-SEVEN;  Surgeon: Tia Alert, MD;  Location: [facility];  Service: Neurosurgery;  Laterality: N/A;  . APPENDECTOMY    . CHOLECYSTECTOMY    . KNEE ARTHROSCOPY    . TUBAL LIGATION      There were no vitals filed for this visit.      Subjective Assessment - 01/19/16 1025    Subjective "I've been in alot of pain, and it feels like alot of burning and stinging"  pt exhibits increased swelling in the L lower calf and reports hx of DVTs and no traumatic injury.    Currently in Pain? Yes   Pain Score 8   took pain medst at 6:30   Pain Location Shoulder   Pain  Orientation Right   Pain Descriptors / Indicators Tightness   Pain Type Surgical pain   Pain Frequency Constant   Aggravating Factors  movement, prolonged positioning,    Pain Relieving Factors pain meds/ ice                         [facility] Adult PT Treatment/Exercise - 01/19/16 0001      Cryotherapy   Number Minutes Cryotherapy 10 Minutes   Cryotherapy Location Shoulder   Type of Cryotherapy Ice pack  wiht pt in supine     Manual Therapy   Manual Therapy Joint mobilization   Manual therapy comments soft tissue massage on lateral aspect of the R shoulder    Joint Mobilization grade 2 R GH distraction, posterior, anterior mobs with distrcaction   Passive ROM Flexion, abduction, ER with gentle oscillations throughout motion to decreas pain                PT Education - 01/19/16 1126    Education provided Yes   Education Details reviewed HEP and to perform table slides slowly, to call her MD regarding swelling in the LLe with hx of DVT and no traumatic injury and tightness   Methods Explanation;Verbal cues;Handout   Comprehension Verbalized understanding;Verbal cues  required          PT Short Term Goals - 01/19/16 1146      PT SHORT TERM GOAL #1   Title pt will be I with inital HEP (02/11/2016)   Baseline inconsistent due to pain   Time 4   Period Weeks   Status On-going     PT SHORT TERM GOAL #2   Title pt will be able to verbalize and demo techniques to reduce R shoulder pain and inflammation via RICE (02/11/2016)   Time 4   Period Weeks   Status On-going     PT SHORT TERM GOAL #3   Title pt will improve R grip strength to >/= 15 # to demonstrating improving R shoulder function (02/11/2016)   Period Weeks   Status On-going     PT SHORT TERM GOAL #4   Title pt will improve R shoulder PROM/ AAROM flexion to >/= 100 degrees, abduction to >/= 90 degrees, ER/IR to >/= 45 degrees with </= 6/10 pain to promote functional mobility (02/11/2016)    Time 4   Period Weeks   Status On-going           PT Long Term Goals - 01/12/16 1334      PT LONG TERM GOAL #1   Title pt will be I with all HEP as of last visit (03/08/2016)   Time 8   Period Weeks   Status New     PT LONG TERM GOAL #2   Title pt will improve R shoulder AROM flexion to >/= 120 degrees and abduction to >/= 100 degrees and ER/ IR >/= 60 degrees with </= 3/10 pain for donning/ doffing clothing, and grooming/doing her hair (03/08/2016)   Time 8   Period Weeks   Status New     PT LONG TERM GOAL #3   Title pt will improve R shoulder strength to >/= 4-/5 in all planes with </= 3/10 pain for funcitonal lifting and carrying activities (03/08/2016)   Time 8   Period Weeks   Status New     PT LONG TERM GOAL #4   Title pt will be able to lift and carrying >/= 8# as well as lift and lower from shelf / cabinet at or above head height with </= 3/10 pain for functional ADLS (03/08/2016)   Time 8   Period Weeks     PT LONG TERM GOAL #5   Title pt will improve her FOTO score to </= 50% limited to demontrate increaes in function at discharge (03/08/2016)   Baseline 68% limited   Time 6   Period Weeks   Status New               Plan - 01/19/16 1139    Clinical Impression Statement Mrs. Corradi reports fluctating pain in the R shoulder and continues to report difficulty not using it. continues to require cues that she needs to stop / avoid using it to prevent reinjury and allow it to heal. continued PROM which improve abduction to 90 degress and flexion to 100 degrees and soft tissue work on the poster aspect of the lateral shoulder. continued ice post session to calm down soreness.    PT Next Visit Plan  PROM, scapular retraction, modalities PRN for pain,    Consulted and Agree with Plan of Care Patient      Patient will benefit from skilled therapeutic intervention in order to improve the following deficits and impairments:  Pain, Impaired flexibility,  Improper  body mechanics, Postural dysfunction, Decreased endurance, Decreased range of motion, Increased edema, Decreased strength, Increased fascial restricitons, Increased muscle spasms, Decreased activity tolerance, Decreased mobility, Decreased scar mobility  Visit Diagnosis: Acute pain of right shoulder  Stiffness of right shoulder, not elsewhere classified  Localized edema  Abnormal posture     Problem List Patient Active Problem List   Diagnosis Date Noted  . History of pulmonary embolus (PE) 12/15/2015  . S/P cervical spinal fusion 03/04/2015  . Obstructive apnea 12/08/2014  . 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 Tingling 08/19/2014  . Limb weakness 08/19/2014  . Muscle weakness (generalized) 08/19/2014  . Muscle ache 07/25/2014  . Arthralgia of multiple joints 07/24/2014  . Encounter for general adult medical examination without abnormal findings 07/24/2014  . Migraine without aura and responsive to treatment 06/09/2014  . Abdominal pain, generalized 01/31/2014  . Chronic headache 01/30/2014  . Cannot sleep 06/24/2013  . 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 Essential (primary) hypertension 05/13/2013  . Digestive disorder 05/13/2013  . H/O injury, presenting hazards to health 05/13/2013  . Bipolar affective disorder (HCC)   . Bipolar I disorder, most recent episode depressed (HCC) 11/15/2012  . PTSD (post-traumatic stress disorder) 11/15/2012   Lulu Riding PT, DPT, LAT, ATC  01/19/16  11:48 AM      [facility] [address] [place], [place], [address] Phone: [phone]   Fax:  [phone]  Name: Steven Veazie MRN: [id number] Date of Birth: [birth year]

## 2016-01-19 NOTE — ED Triage Notes (Signed)
Pt states she had rotator cuff surgery 9/20. Pt has started having bilateral leg swelling, but feels the left is greater than the right. Pt states both legs are painful. Pt has hx of DVT. Pt states she has taken her lasix as order but it is not helping with the swelling.

## 2016-01-19 NOTE — Progress Notes (Signed)
Preliminary results by tech - Venous Duplex Lower Ext. Completed. Negative for deep and superficial vein thrombosis in both legs.  Ryder Man, BS, RDMS, RVT  

## 2016-01-19 NOTE — Discharge Instructions (Signed)
Continue to elevate your legs at home. Try to prop them up on more pillows. Try to increased activity such as walking. Try compression stockings. Follow up with your doctor.

## 2016-01-19 NOTE — ED Notes (Signed)
Papers reviewed, DC instructions reviewed with PA, patient leaving with pain in her shoulder from a recent rotator cuff surgery. Verbalizes understanding of home care instructions and denies questions

## 2016-01-20 ENCOUNTER — Ambulatory Visit: Payer: Medicare Other | Admitting: Physical Therapy

## 2016-01-21 ENCOUNTER — Encounter: Payer: Self-pay | Admitting: Physical Therapy

## 2016-01-22 ENCOUNTER — Ambulatory Visit: Payer: Medicare Other | Admitting: Physical Therapy

## 2016-01-22 DIAGNOSIS — R600 Localized edema: Secondary | ICD-10-CM

## 2016-01-22 DIAGNOSIS — M25511 Pain in right shoulder: Secondary | ICD-10-CM | POA: Diagnosis not present

## 2016-01-22 DIAGNOSIS — M25611 Stiffness of right shoulder, not elsewhere classified: Secondary | ICD-10-CM

## 2016-01-22 DIAGNOSIS — R293 Abnormal posture: Secondary | ICD-10-CM

## 2016-01-22 NOTE — Therapy (Signed)
[place], [place], [address] Phone: [phone]   Fax:  [phone]  Physical Therapy Treatment  Patient Details  Name: Karen [name] MRN: [id number] Date of Birth: [birth year] Referring Provider: Harvest Dark Md  Encounter Date: 01/22/2016      PT End of Session - 01/22/16 1151    Visit Number 4   Number of Visits 17   Date for PT Re-Evaluation 03/08/16   Authorization Type Medicare, Kx mod by 15th visit, progress note by 10th visit.    PT Start Time 1147   PT Stop Time 1231   PT Time Calculation (min) 44 min      Past Medical History:  Diagnosis Date  . Anemia   . Anxiety   . Arthritis   . Bipolar affective disorder ([place])   . Bowel obstruction   . Depression   . DVT (deep venous thrombosis) ([place])   . GERD (gastroesophageal reflux disease)   . Hypertension   . IBS (irritable bowel syndrome)   . Migraine   . PTSD (post-traumatic stress disorder)   . Pulmonary embolism [facility])     Past Surgical History:  Procedure Laterality Date  . ABDOMINAL HYSTERECTOMY    . ABDOMINAL SURGERY    . ANTERIOR CERVICAL DECOMP/DISCECTOMY FUSION N/A 03/04/2015   Procedure: ANTERIOR CERVICAL DECOMPRESSION/DISCECTOMY FUSION CERVICAL SIX-SEVEN;  Surgeon: Eustace Moore, MD;  Location: [facility];  Service: Neurosurgery;  Laterality: N/A;  . APPENDECTOMY    . CHOLECYSTECTOMY    . KNEE ARTHROSCOPY    . ROTATOR CUFF REPAIR    . TUBAL LIGATION      There were no vitals filed for this visit.      Subjective Assessment - 01/22/16 1149    Subjective Right shoulder 8-9/10, upper arm 6/10, elbow also hurts.                          [place] Adult PT Treatment/Exercise - 01/22/16 0001      Shoulder Exercises: Seated   Retraction 10 reps   Retraction Limitations and small shoulder rolls each way   Other Seated Exercises elbow flexion x 20   more painful today     Shoulder Exercises: Standing    Other Standing Exercises Pendulum   cues for relaxation and elbow extension     Modalities   Modalities Electrical Stimulation     Electrical Stimulation   Electrical Stimulation Location Right shoulder   Electrical Stimulation Action IFC    Electrical Stimulation Parameters 77m   Electrical Stimulation Goals Pain     Manual Therapy   Joint Mobilization grade 2 R GH distraction, posterior, anterior mobs with distrcaction   Passive ROM Flexion, abduction, ER with gentle oscillations throughout motion to decreas pain                  PT Short Term Goals - 01/19/16 1146      PT SHORT TERM GOAL #1   Title pt will be I with inital HEP (02/11/2016)   Baseline inconsistent due to pain   Time 4   Period Weeks   Status On-going     PT SHORT TERM GOAL #2   Title pt will be able to verbalize and demo techniques to reduce R shoulder pain and inflammation via RICE (02/11/2016)   Time 4   Period Weeks   Status On-going     PT SHORT TERM GOAL #3  Title pt will improve R grip strength to >/= 15 # to demonstrating improving R shoulder function (02/11/2016)   Period Weeks   Status On-going     PT SHORT TERM GOAL #4   Title pt will improve R shoulder PROM/ AAROM flexion to >/= 100 degrees, abduction to >/= 90 degrees, ER/IR to >/= 45 degrees with </= 6/10 pain to promote functional mobility (02/11/2016)   Time 4   Period Weeks   Status On-going           PT Long Term Goals - 01/12/16 1334      PT LONG TERM GOAL #1   Title pt will be I with all HEP as of last visit (03/08/2016)   Time 8   Period Weeks   Status New     PT LONG TERM GOAL #2   Title pt will improve R shoulder AROM flexion to >/= 120 degrees and abduction to >/= 100 degrees and ER/ IR >/= 60 degrees with </= 3/10 pain for donning/ doffing clothing, and grooming/doing her hair (03/08/2016)   Time 8   Period Weeks   Status New     PT LONG TERM GOAL #3   Title pt will improve R shoulder strength to  >/= 4-/5 in all planes with </= 3/10 pain for funcitonal lifting and carrying activities (03/08/2016)   Time 8   Period Weeks   Status New     PT LONG TERM GOAL #4   Title pt will be able to lift and carrying >/= 8# as well as lift and lower from shelf / cabinet at or above head height with </= 3/10 pain for functional ADLS (68/02/5725)   Time 8   Period Weeks     PT LONG TERM GOAL #5   Title pt will improve her FOTO score to </= 50% limited to demontrate increaes in function at discharge (03/08/2016)   Baseline 68% limited   Time 6   Period Weeks   Status New               Plan - 01/22/16 1149    Clinical Impression Statement ED thought the new position after surgery was decreasing the circulation to LEs. She slept in her bed as advised from MD however the next morning she had severe shoulder pain. She feels her shoulder is being torn out. Her pain levels are about the same however noe are also located in upper arm and elbow. Elbow flexion AROM painful as well as all gentle scapular motions. ER PROM maintained at 30 degrees however unable to perfrom PROM of flexion more than 45 degrees due to severe pain. She does not have excesssive swelling or heat in the right shoulder area. Trial of IFC with pt reporting significant decrease in pain while donned. Upon sitting up, pain retruned to 8-9/10. No new goals met due to pain and 2 weeks s/p surgery.    PT Next Visit Plan  PROM, scapular retraction, modalities PRN for pain; assess benefit if IFC last visit- may need to try with PROM      Patient will benefit from skilled therapeutic intervention in order to improve the following deficits and impairments:  Pain, Impaired flexibility, Improper body mechanics, Postural dysfunction, Decreased endurance, Decreased range of motion, Increased edema, Decreased strength, Increased fascial restricitons, Increased muscle spasms, Decreased activity tolerance, Decreased mobility, Decreased scar  mobility  Visit Diagnosis: Acute pain of right shoulder  Stiffness of right shoulder, not elsewhere classified  Localized edema  Abnormal posture  Right shoulder pain, unspecified chronicity     Problem List Patient Active Problem List   Diagnosis Date Noted  . History of pulmonary embolus (PE) 12/15/2015  . S/P cervical spinal fusion 03/04/2015  . Obstructive apnea 12/08/2014  . 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 Tingling 08/19/2014  . Limb weakness 08/19/2014  . Muscle weakness (generalized) 08/19/2014  . Muscle ache 07/25/2014  . Arthralgia of multiple joints 07/24/2014  . Encounter for general adult medical examination without abnormal findings 07/24/2014  . Migraine without aura and responsive to treatment 06/09/2014  . Abdominal pain, generalized 01/31/2014  . Chronic headache 01/30/2014  . Cannot sleep 06/24/2013  . 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 Essential (primary) hypertension 05/13/2013  . Digestive disorder 05/13/2013  . H/O injury, presenting hazards to health 05/13/2013  . Bipolar affective disorder ([place])   . Bipolar I disorder, most recent episode depressed ([place]) 11/15/2012  . PTSD (post-traumatic stress disorder) 11/15/2012    Dorene Ar, PTA 01/22/2016, 12:53 PM  [facility] [address] [place], [place], [address] Phone: [phone]   Fax:  [phone]  Name: Karen [name] MRN: [id number] Date of Birth: 02/27/1971

## 2016-01-25 ENCOUNTER — Ambulatory Visit: Payer: Medicare Other | Admitting: Physical Therapy

## 2016-01-25 DIAGNOSIS — R293 Abnormal posture: Secondary | ICD-10-CM

## 2016-01-25 DIAGNOSIS — M25511 Pain in right shoulder: Secondary | ICD-10-CM

## 2016-01-25 DIAGNOSIS — R600 Localized edema: Secondary | ICD-10-CM

## 2016-01-25 DIAGNOSIS — M25611 Stiffness of right shoulder, not elsewhere classified: Secondary | ICD-10-CM

## 2016-01-25 NOTE — Therapy (Signed)
[facility] Outpatient Rehabilitation [facility] [address] [place], [place], [address] Phone: [phone]   Fax:  [phone]  Physical Therapy Treatment  Patient Details  Name: Karen [name] MRN: [id number] Date of Birth: 1970-05-22 Referring Provider: Baltazar Apo Md  Encounter Date: 01/25/2016      PT End of Session - 01/25/16 1241    Visit Number 5   Number of Visits 17   Date for PT Re-Evaluation 03/08/16   Authorization Type Medicare, Kx mod by 15th visit, progress note by 10th visit.    PT Start Time 1147   PT Stop Time 1244   PT Time Calculation (min) 57 min   Activity Tolerance Patient tolerated treatment well   Behavior During Therapy WFL for tasks assessed/performed      Past Medical History:  Diagnosis Date  . Anemia   . Anxiety   . Arthritis   . Bipolar affective disorder (HCC)   . Bowel obstruction   . Depression   . DVT (deep venous thrombosis) (HCC)   . GERD (gastroesophageal reflux disease)   . Hypertension   . IBS (irritable bowel syndrome)   . Migraine   . PTSD (post-traumatic stress disorder)   . Pulmonary embolism [facility])     Past Surgical History:  Procedure Laterality Date  . ABDOMINAL HYSTERECTOMY    . ABDOMINAL SURGERY    . ANTERIOR CERVICAL DECOMP/DISCECTOMY FUSION N/A 03/04/2015   Procedure: ANTERIOR CERVICAL DECOMPRESSION/DISCECTOMY FUSION CERVICAL SIX-SEVEN;  Surgeon: Tia Alert, MD;  Location: [facility];  Service: Neurosurgery;  Laterality: N/A;  . APPENDECTOMY    . CHOLECYSTECTOMY    . KNEE ARTHROSCOPY    . ROTATOR CUFF REPAIR    . TUBAL LIGATION      There were no vitals filed for this visit.      Subjective Assessment - 01/25/16 1149    Subjective "The exercises are going okay, I was really sick the other day. the pain is getting better today its at a 6/10"   Currently in Pain? Yes   Pain Score 6    Pain Orientation Left   Pain Type Surgical pain   Pain Onset 1 to 4 weeks ago   Pain Frequency  Constant            [facility] PT Assessment - 01/25/16 0001      PROM   Right Shoulder Flexion 115 Degrees  with pulleys                     [facility] Adult PT Treatment/Exercise - 01/25/16 1151      Shoulder Exercises: Seated   Retraction Strengthening;Both;20 reps   Retraction Limitations and small shoulder rolls each way   Row 20 reps   Other Seated Exercises elbow flexion x 20      Shoulder Exercises: Pulleys   Flexion 3 minutes  115 degrees   Flexion Limitations verbal cues to go slow and to only use L for lifting      Shoulder Exercises: ROM/Strengthening   Other ROM/Strengthening Exercises wand ER in supine 2 x 10  1 set in nuetral, 1 set going at an angle     Cryotherapy   Number Minutes Cryotherapy 10 Minutes   Cryotherapy Location Shoulder   Type of Cryotherapy Ice pack     Manual Therapy   Joint Mobilization grade 2-3 R GH distraction, posterior, anterior mobs with distrcaction   Passive ROM Flexion, abduction, ER with gentle oscillations throughout motion to decreas  pain                PT Education - 01/25/16 1240    Education provided Yes   Education Details continued to review HEP and discuss progressing slowly with HEP, updated HEP for bicep curl. paper to get overdoor pulleys due to the progress she did today.    Person(s) Educated Patient   Methods Explanation;Verbal cues;Handout   Comprehension Verbalized understanding;Verbal cues required          PT Short Term Goals - 01/19/16 1146      PT SHORT TERM GOAL #1   Title pt will be I with inital HEP (02/11/2016)   Baseline inconsistent due to pain   Time 4   Period Weeks   Status On-going     PT SHORT TERM GOAL #2   Title pt will be able to verbalize and demo techniques to reduce R shoulder pain and inflammation via RICE (02/11/2016)   Time 4   Period Weeks   Status On-going     PT SHORT TERM GOAL #3   Title pt will improve R grip strength to >/= 15 # to demonstrating  improving R shoulder function (02/11/2016)   Period Weeks   Status On-going     PT SHORT TERM GOAL #4   Title pt will improve R shoulder PROM/ AAROM flexion to >/= 100 degrees, abduction to >/= 90 degrees, ER/IR to >/= 45 degrees with </= 6/10 pain to promote functional mobility (02/11/2016)   Time 4   Period Weeks   Status On-going           PT Long Term Goals - 01/12/16 1334      PT LONG TERM GOAL #1   Title pt will be I with all HEP as of last visit (03/08/2016)   Time 8   Period Weeks   Status New     PT LONG TERM GOAL #2   Title pt will improve R shoulder AROM flexion to >/= 120 degrees and abduction to >/= 100 degrees and ER/ IR >/= 60 degrees with </= 3/10 pain for donning/ doffing clothing, and grooming/doing her hair (03/08/2016)   Time 8   Period Weeks   Status New     PT LONG TERM GOAL #3   Title pt will improve R shoulder strength to >/= 4-/5 in all planes with </= 3/10 pain for funcitonal lifting and carrying activities (03/08/2016)   Time 8   Period Weeks   Status New     PT LONG TERM GOAL #4   Title pt will be able to lift and carrying >/= 8# as well as lift and lower from shelf / cabinet at or above head height with </= 3/10 pain for functional ADLS (03/08/2016)   Time 8   Period Weeks     PT LONG TERM GOAL #5   Title pt will improve her FOTO score to </= 50% limited to demontrate increaes in function at discharge (03/08/2016)   Baseline 68% limited   Time 6   Period Weeks   Status New               Plan - 01/25/16 1241    Clinical Impression Statement pt reports doing better today with pain at 6/10. continued focus on PROm utilizing pulleys for flexion which she was able to get to 115 degrees. she was able to do all exercises reporting pain at 5/10. utilized ice post session with no e-stim.  PT Next Visit Plan  PROM, scapular retraction, modalities PRN for pain; assess benefit if IFC last visit- may need to try with PROM   PT Home Exercise  Plan bicep curls with no weight   Consulted and Agree with Plan of Care Patient      Patient will benefit from skilled therapeutic intervention in order to improve the following deficits and impairments:  Pain, Impaired flexibility, Improper body mechanics, Postural dysfunction, Decreased endurance, Decreased range of motion, Increased edema, Decreased strength, Increased fascial restricitons, Increased muscle spasms, Decreased activity tolerance, Decreased mobility, Decreased scar mobility  Visit Diagnosis: Acute pain of right shoulder  Stiffness of right shoulder, not elsewhere classified  Localized edema  Abnormal posture     Problem List Patient Active Problem List   Diagnosis Date Noted  . History of pulmonary embolus (PE) 12/15/2015  . S/P cervical spinal fusion 03/04/2015  . Obstructive apnea 12/08/2014  . 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 Tingling 08/19/2014  . Limb weakness 08/19/2014  . Muscle weakness (generalized) 08/19/2014  . Muscle ache 07/25/2014  . Arthralgia of multiple joints 07/24/2014  . Encounter for general adult medical examination without abnormal findings 07/24/2014  . Migraine without aura and responsive to treatment 06/09/2014  . Abdominal pain, generalized 01/31/2014  . Chronic headache 01/30/2014  . Cannot sleep 06/24/2013  . 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 Essential (primary) hypertension 05/13/2013  . Digestive disorder 05/13/2013  . H/O injury, presenting hazards to health 05/13/2013  . Bipolar affective disorder (HCC)   . Bipolar I disorder, most recent episode depressed (HCC) 11/15/2012  . PTSD (post-traumatic stress disorder) 11/15/2012   Lulu Riding PT, DPT, LAT, ATC  01/25/16  12:46 PM      [facility] [address] [place], [place], [address] Phone:  [phone]   Fax:  [phone]  Name: Pietrina Jagodzinski MRN: [id number] Date of Birth: Dec 08, 1970

## 2016-01-27 ENCOUNTER — Ambulatory Visit: Payer: Medicare Other | Admitting: Physical Therapy

## 2016-01-27 DIAGNOSIS — M25611 Stiffness of right shoulder, not elsewhere classified: Secondary | ICD-10-CM

## 2016-01-27 DIAGNOSIS — M25511 Pain in right shoulder: Secondary | ICD-10-CM | POA: Diagnosis not present

## 2016-01-27 DIAGNOSIS — R293 Abnormal posture: Secondary | ICD-10-CM

## 2016-01-27 DIAGNOSIS — R600 Localized edema: Secondary | ICD-10-CM

## 2016-01-27 NOTE — Therapy (Signed)
[facility] Outpatient Rehabilitation [facility] [address] [place], [place], [address] Phone: [phone]   Fax:  [phone]  Physical Therapy Treatment  Patient Details  Name: Karen [name] MRN: [id number] Date of Birth: July 30, 1970 Referring Provider: Baltazar Apo Md  Encounter Date: 01/27/2016      PT End of Session - 01/27/16 1136    Visit Number 6   Number of Visits 17   Date for PT Re-Evaluation 03/08/16   Authorization Type Medicare, Kx mod by 15th visit, progress note by 10th visit.    PT Start Time 1055   PT Stop Time 1148   PT Time Calculation (min) 53 min      Past Medical History:  Diagnosis Date  . Anemia   . Anxiety   . Arthritis   . Bipolar affective disorder (HCC)   . Bowel obstruction   . Depression   . DVT (deep venous thrombosis) (HCC)   . GERD (gastroesophageal reflux disease)   . Hypertension   . IBS (irritable bowel syndrome)   . Migraine   . PTSD (post-traumatic stress disorder)   . Pulmonary embolism [facility])     Past Surgical History:  Procedure Laterality Date  . ABDOMINAL HYSTERECTOMY    . ABDOMINAL SURGERY    . ANTERIOR CERVICAL DECOMP/DISCECTOMY FUSION N/A 03/04/2015   Procedure: ANTERIOR CERVICAL DECOMPRESSION/DISCECTOMY FUSION CERVICAL SIX-SEVEN;  Surgeon: Tia Alert, MD;  Location: [facility];  Service: Neurosurgery;  Laterality: N/A;  . APPENDECTOMY    . CHOLECYSTECTOMY    . KNEE ARTHROSCOPY    . ROTATOR CUFF REPAIR    . TUBAL LIGATION      There were no vitals filed for this visit.      Subjective Assessment - 01/27/16 1059    Subjective I am unable to lie comfortably on my back at home. I am still sleeping in the recliner. I am having spasms.    Currently in Pain? Yes   Pain Score 5    Pain Location Shoulder   Pain Orientation Right   Pain Descriptors / Indicators Spasm   Pain Type Surgical pain   Aggravating Factors  movement, lay on back   Pain Relieving Factors pain meds, ice                          [facility] Adult PT Treatment/Exercise - 01/27/16 0001      Shoulder Exercises: Seated   Retraction Strengthening;Both;20 reps  seated   Retraction Limitations and small shoulder rolls each way   Other Seated Exercises elbow flexion x 20      Shoulder Exercises: Standing   Other Standing Exercises Ue ranger in standing flexion to 60 degrees, then moving into horizontal abduction and adduction, then raised to 75 degrees (maximum in standing)      Shoulder Exercises: Pulleys   Flexion 3 minutes  115 degrees   Flexion Limitations verbal cues to go slow and to only use L for lifting      Cryotherapy   Number Minutes Cryotherapy 10 Minutes   Cryotherapy Location Shoulder   Type of Cryotherapy Ice pack     Manual Therapy   Joint Mobilization grade 2-3 R GH distraction, posterior, anterior mobs with distrcaction   Passive ROM Flexion, abduction, ER with gentle oscillations throughout motion to decreas pain                  PT Short Term Goals - 01/19/16 1146  PT SHORT TERM GOAL #1   Title pt will be I with inital HEP (02/11/2016)   Baseline inconsistent due to pain   Time 4   Period Weeks   Status On-going     PT SHORT TERM GOAL #2   Title pt will be able to verbalize and demo techniques to reduce R shoulder pain and inflammation via RICE (02/11/2016)   Time 4   Period Weeks   Status On-going     PT SHORT TERM GOAL #3   Title pt will improve R grip strength to >/= 15 # to demonstrating improving R shoulder function (02/11/2016)   Period Weeks   Status On-going     PT SHORT TERM GOAL #4   Title pt will improve R shoulder PROM/ AAROM flexion to >/= 100 degrees, abduction to >/= 90 degrees, ER/IR to >/= 45 degrees with </= 6/10 pain to promote functional mobility (02/11/2016)   Time 4   Period Weeks   Status On-going           PT Long Term Goals - 01/12/16 1334      PT LONG TERM GOAL #1   Title pt will be I with all  HEP as of last visit (03/08/2016)   Time 8   Period Weeks   Status New     PT LONG TERM GOAL #2   Title pt will improve R shoulder AROM flexion to >/= 120 degrees and abduction to >/= 100 degrees and ER/ IR >/= 60 degrees with </= 3/10 pain for donning/ doffing clothing, and grooming/doing her hair (03/08/2016)   Time 8   Period Weeks   Status New     PT LONG TERM GOAL #3   Title pt will improve R shoulder strength to >/= 4-/5 in all planes with </= 3/10 pain for funcitonal lifting and carrying activities (03/08/2016)   Time 8   Period Weeks   Status New     PT LONG TERM GOAL #4   Title pt will be able to lift and carrying >/= 8# as well as lift and lower from shelf / cabinet at or above head height with </= 3/10 pain for functional ADLS (03/08/2016)   Time 8   Period Weeks     PT LONG TERM GOAL #5   Title pt will improve her FOTO score to </= 50% limited to demontrate increaes in function at discharge (03/08/2016)   Baseline 68% limited   Time 6   Period Weeks   Status New               Plan - 01/27/16 1140    Clinical Impression Statement Pt reports constant spasms however she perfroms all exercises without c/o increased pain today. Her pain level is 5-6/10. She continues to demonstrate hypersensitivity to light touch at right shoulder. Again, encouraged her to desensitize the area with different textures and light touch. She reports she has not attempted this and touches her shoulder as little as possible due to pain. PROM tolerance improved today, able to attain 120 degrees flexion, 90 degrees abduction and 35 degrees ER.    PT Next Visit Plan  PROM, scapular retraction, pulleys0(focus passive) Pt is 3 weeks s/p surgery      Patient will benefit from skilled therapeutic intervention in order to improve the following deficits and impairments:  Pain, Impaired flexibility, Improper body mechanics, Postural dysfunction, Decreased endurance, Decreased range of motion,  Increased edema, Decreased strength, Increased fascial restricitons, Increased muscle spasms,  Decreased activity tolerance, Decreased mobility, Decreased scar mobility  Visit Diagnosis: Acute pain of right shoulder  Stiffness of right shoulder, not elsewhere classified  Localized edema  Abnormal posture     Problem List Patient Active Problem List   Diagnosis Date Noted  . History of pulmonary embolus (PE) 12/15/2015  . S/P cervical spinal fusion 03/04/2015  . Obstructive apnea 12/08/2014  . 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 Tingling 08/19/2014  . Limb weakness 08/19/2014  . Muscle weakness (generalized) 08/19/2014  . Muscle ache 07/25/2014  . Arthralgia of multiple joints 07/24/2014  . Encounter for general adult medical examination without abnormal findings 07/24/2014  . Migraine without aura and responsive to treatment 06/09/2014  . Abdominal pain, generalized 01/31/2014  . Chronic headache 01/30/2014  . Cannot sleep 06/24/2013  . 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 Essential (primary) hypertension 05/13/2013  . Digestive disorder 05/13/2013  . H/O injury, presenting hazards to health 05/13/2013  . Bipolar affective disorder (HCC)   . Bipolar I disorder, most recent episode depressed (HCC) 11/15/2012  . PTSD (post-traumatic stress disorder) 11/15/2012    Sherrie Mustache, PTA 01/27/2016, 11:54 AM  [facility] [address] [place], [place], [address] Phone: [phone]   Fax:  [phone]  Name: Dachelle Molzahn MRN: [id number] Date of Birth: March 07, 1971

## 2016-01-29 ENCOUNTER — Ambulatory Visit: Payer: Medicare Other | Admitting: Physical Therapy

## 2016-01-29 DIAGNOSIS — R600 Localized edema: Secondary | ICD-10-CM

## 2016-01-29 DIAGNOSIS — M25511 Pain in right shoulder: Secondary | ICD-10-CM | POA: Diagnosis not present

## 2016-01-29 DIAGNOSIS — R293 Abnormal posture: Secondary | ICD-10-CM

## 2016-01-29 DIAGNOSIS — M25611 Stiffness of right shoulder, not elsewhere classified: Secondary | ICD-10-CM

## 2016-01-29 NOTE — Therapy (Signed)
[facility] Outpatient Rehabilitation [facility] [address] [place], [place], [address] Phone: [phone]   Fax:  [phone]  Physical Therapy Treatment  Patient Details  Name: Karen [name] MRN: [id number] Date of Birth: 1970-05-28 Referring Provider: Baltazar Apo Md  Encounter Date: 01/29/2016      PT End of Session - 01/29/16 0900    Visit Number 7   Number of Visits 17   Date for PT Re-Evaluation 03/08/16   Authorization Type Medicare, Kx mod by 15th visit, progress note by 10th visit.    PT Start Time 0900   PT Stop Time 1005   PT Time Calculation (min) 65 min      Past Medical History:  Diagnosis Date  . Anemia   . Anxiety   . Arthritis   . Bipolar affective disorder (HCC)   . Bowel obstruction   . Depression   . DVT (deep venous thrombosis) (HCC)   . GERD (gastroesophageal reflux disease)   . Hypertension   . IBS (irritable bowel syndrome)   . Migraine   . PTSD (post-traumatic stress disorder)   . Pulmonary embolism [facility])     Past Surgical History:  Procedure Laterality Date  . ABDOMINAL HYSTERECTOMY    . ABDOMINAL SURGERY    . ANTERIOR CERVICAL DECOMP/DISCECTOMY FUSION N/A 03/04/2015   Procedure: ANTERIOR CERVICAL DECOMPRESSION/DISCECTOMY FUSION CERVICAL SIX-SEVEN;  Surgeon: Tia Alert, MD;  Location: [facility];  Service: Neurosurgery;  Laterality: N/A;  . APPENDECTOMY    . CHOLECYSTECTOMY    . KNEE ARTHROSCOPY    . ROTATOR CUFF REPAIR    . TUBAL LIGATION      There were no vitals filed for this visit.      Subjective Assessment - 01/29/16 0859    Subjective I tried desensitizing. Using the rougher material helped.    Currently in Pain? Yes   Pain Score 7    Pain Location Shoulder   Pain Orientation Right   Pain Descriptors / Indicators Spasm;Aching;Throbbing;Lambert Mody            [facility] PT Assessment - 01/29/16 0001      PROM   Right Shoulder Flexion 120 Degrees   Right Shoulder ABduction 90 Degrees   Right  Shoulder Internal Rotation --  at least 50   Right Shoulder External Rotation 40 Degrees                     [facility] Adult PT Treatment/Exercise - 01/29/16 0001      Shoulder Exercises: Seated   Retraction Strengthening;Both;20 reps  standing   Retraction Limitations and small shoulder rolls each way   Other Seated Exercises elbow flexion x 20      Shoulder Exercises: Standing   Other Standing Exercises Ue ranger in standing flexion to 60 degrees, then moving into horizontal abduction and adduction, then raised to 75 degrees (maximum in standing)      Shoulder Exercises: Pulleys   Flexion 3 minutes  90 degrees today due to increased pain     Shoulder Exercises: ROM/Strengthening   Other ROM/Strengthening Exercises wand ER in supine 2 x 10  1 set in nuetral, 1 set going at an angle     Cryotherapy   Number Minutes Cryotherapy 10 Minutes   Cryotherapy Location Shoulder   Type of Cryotherapy Ice pack     Manual Therapy   Joint Mobilization grade 2-3 R GH distraction, posterior, anterior mobs with distrcaction   Passive ROM Flexion, abduction, ER with  gentle oscillations throughout motion to decreas pain                  PT Short Term Goals - 01/19/16 1146      PT SHORT TERM GOAL #1   Title pt will be I with inital HEP (02/11/2016)   Baseline inconsistent due to pain   Time 4   Period Weeks   Status On-going     PT SHORT TERM GOAL #2   Title pt will be able to verbalize and demo techniques to reduce R shoulder pain and inflammation via RICE (02/11/2016)   Time 4   Period Weeks   Status On-going     PT SHORT TERM GOAL #3   Title pt will improve R grip strength to >/= 15 # to demonstrating improving R shoulder function (02/11/2016)   Period Weeks   Status On-going     PT SHORT TERM GOAL #4   Title pt will improve R shoulder PROM/ AAROM flexion to >/= 100 degrees, abduction to >/= 90 degrees, ER/IR to >/= 45 degrees with </= 6/10 pain to promote  functional mobility (02/11/2016)   Time 4   Period Weeks   Status On-going           PT Long Term Goals - 01/12/16 1334      PT LONG TERM GOAL #1   Title pt will be I with all HEP as of last visit (03/08/2016)   Time 8   Period Weeks   Status New     PT LONG TERM GOAL #2   Title pt will improve R shoulder AROM flexion to >/= 120 degrees and abduction to >/= 100 degrees and ER/ IR >/= 60 degrees with </= 3/10 pain for donning/ doffing clothing, and grooming/doing her hair (03/08/2016)   Time 8   Period Weeks   Status New     PT LONG TERM GOAL #3   Title pt will improve R shoulder strength to >/= 4-/5 in all planes with </= 3/10 pain for funcitonal lifting and carrying activities (03/08/2016)   Time 8   Period Weeks   Status New     PT LONG TERM GOAL #4   Title pt will be able to lift and carrying >/= 8# as well as lift and lower from shelf / cabinet at or above head height with </= 3/10 pain for functional ADLS (03/08/2016)   Time 8   Period Weeks     PT LONG TERM GOAL #5   Title pt will improve her FOTO score to </= 50% limited to demontrate increaes in function at discharge (03/08/2016)   Baseline 68% limited   Time 6   Period Weeks   Status New               Plan - 01/29/16 0956    Clinical Impression Statement Increased pain today, pt attributes to the weather. Passive ROM increasing each visit. She is trying to desensitize her shoulder at home.    PT Next Visit Plan  PROM, scapular retraction, pulleys0(focus passive) Pt is 3 weeks s/p surgery- SEES MD next week      Patient will benefit from skilled therapeutic intervention in order to improve the following deficits and impairments:  Pain, Impaired flexibility, Improper body mechanics, Postural dysfunction, Decreased endurance, Decreased range of motion, Increased edema, Decreased strength, Increased fascial restricitons, Increased muscle spasms, Decreased activity tolerance, Decreased mobility, Decreased  scar mobility  Visit Diagnosis: Acute pain of right shoulder  Stiffness of right shoulder, not elsewhere classified  Localized edema  Abnormal posture  Right shoulder pain, unspecified chronicity     Problem List Patient Active Problem List   Diagnosis Date Noted  . History of pulmonary embolus (PE) 12/15/2015  . S/P cervical spinal fusion 03/04/2015  . Obstructive apnea 12/08/2014  . 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 Tingling 08/19/2014  . Limb weakness 08/19/2014  . Muscle weakness (generalized) 08/19/2014  . Muscle ache 07/25/2014  . Arthralgia of multiple joints 07/24/2014  . Encounter for general adult medical examination without abnormal findings 07/24/2014  . Migraine without aura and responsive to treatment 06/09/2014  . Abdominal pain, generalized 01/31/2014  . Chronic headache 01/30/2014  . Cannot sleep 06/24/2013  . 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 Essential (primary) hypertension 05/13/2013  . Digestive disorder 05/13/2013  . H/O injury, presenting hazards to health 05/13/2013  . Bipolar affective disorder (HCC)   . Bipolar I disorder, most recent episode depressed (HCC) 11/15/2012  . PTSD (post-traumatic stress disorder) 11/15/2012    Sherrie Mustache, PTA 01/29/2016, 10:03 AM  [facility] [address] [place], [place], [address] Phone: [phone]   Fax:  [phone]  Name: Karen [name] MRN: [id number] Date of Birth: [birth year]

## 2016-02-01 ENCOUNTER — Ambulatory Visit: Payer: Medicare Other | Admitting: Physical Therapy

## 2016-02-03 ENCOUNTER — Ambulatory Visit: Payer: Medicare Other | Admitting: Physical Therapy

## 2016-02-03 DIAGNOSIS — M6281 Muscle weakness (generalized): Secondary | ICD-10-CM

## 2016-02-03 DIAGNOSIS — R600 Localized edema: Secondary | ICD-10-CM

## 2016-02-03 DIAGNOSIS — M25511 Pain in right shoulder: Secondary | ICD-10-CM | POA: Diagnosis not present

## 2016-02-03 DIAGNOSIS — R293 Abnormal posture: Secondary | ICD-10-CM

## 2016-02-03 DIAGNOSIS — M25611 Stiffness of right shoulder, not elsewhere classified: Secondary | ICD-10-CM

## 2016-02-03 NOTE — Therapy (Signed)
[facility] Outpatient Rehabilitation [facility] [address] [place], [place], [address] Phone: [phone]   Fax:  [phone]  Physical Therapy Treatment  Patient Details  Name: Karen [name] MRN: [id number] Date of Birth: 03/25/1971 Referring Provider: Baltazar Apo Md  Encounter Date: 02/03/2016      PT End of Session - 02/03/16 1018    Visit Number 8   Number of Visits 17   Date for PT Re-Evaluation 03/08/16   Authorization Type Medicare, Kx mod by 15th visit, progress note by 10th visit.    PT Start Time 0935   PT Stop Time 1023   PT Time Calculation (min) 48 min   Activity Tolerance Patient tolerated treatment well   Behavior During Therapy WFL for tasks assessed/performed      Past Medical History:  Diagnosis Date  . Anemia   . Anxiety   . Arthritis   . Bipolar affective disorder (HCC)   . Bowel obstruction   . Depression   . DVT (deep venous thrombosis) (HCC)   . GERD (gastroesophageal reflux disease)   . Hypertension   . IBS (irritable bowel syndrome)   . Migraine   . PTSD (post-traumatic stress disorder)   . Pulmonary embolism [facility])     Past Surgical History:  Procedure Laterality Date  . ABDOMINAL HYSTERECTOMY    . ABDOMINAL SURGERY    . ANTERIOR CERVICAL DECOMP/DISCECTOMY FUSION N/A 03/04/2015   Procedure: ANTERIOR CERVICAL DECOMPRESSION/DISCECTOMY FUSION CERVICAL SIX-SEVEN;  Surgeon: Tia Alert, MD;  Location: [facility];  Service: Neurosurgery;  Laterality: N/A;  . APPENDECTOMY    . CHOLECYSTECTOMY    . KNEE ARTHROSCOPY    . ROTATOR CUFF REPAIR    . TUBAL LIGATION      There were no vitals filed for this visit.      Subjective Assessment - 02/03/16 0937    Subjective " I saw the MD and he reported he was sure why I was having such hypersensitivity, but to continue with PT"   Currently in Pain? Yes   Pain Score 8   took pain meds at 8am   Pain Location Shoulder   Pain Orientation Right   Pain Onset 1 to 4 weeks ago   Pain Frequency Constant   Aggravating Factors  movements, lay on back   Pain Relieving Factors pain meds, ice,                          [facility] Adult PT Treatment/Exercise - 02/03/16 0001      Shoulder Exercises: Pulleys   Flexion Other (comment)  5 min, got to 120 degrees   Flexion Limitations went longer to demonstrate to pt that gradual movement will help with reducing pain      Shoulder Exercises: ROM/Strengthening   UBE (Upper Arm Bike) Nu-step with UE/LE L 2 x 5 min  verbal cues to slow controlled motion avoiding ERP     Shoulder Exercises: Isometric Strengthening   Flexion 5X5"   Extension 5X5"   External Rotation 5X5"   Internal Rotation 5X5"   ABduction 5X5"     Cryotherapy   Number Minutes Cryotherapy 10 Minutes   Cryotherapy Location Shoulder   Type of Cryotherapy Ice pack                PT Education - 02/03/16 1017    Education provided Yes   Education Details controlled movement/ exercise will typically help reduce pain as long as it  is controlled. (educated during treatment)   Person(s) Educated Patient   Methods Explanation;Verbal cues   Comprehension Verbalized understanding;Verbal cues required          PT Short Term Goals - 01/19/16 1146      PT SHORT TERM GOAL #1   Title pt will be I with inital HEP (02/11/2016)   Baseline inconsistent due to pain   Time 4   Period Weeks   Status On-going     PT SHORT TERM GOAL #2   Title pt will be able to verbalize and demo techniques to reduce R shoulder pain and inflammation via RICE (02/11/2016)   Time 4   Period Weeks   Status On-going     PT SHORT TERM GOAL #3   Title pt will improve R grip strength to >/= 15 # to demonstrating improving R shoulder function (02/11/2016)   Period Weeks   Status On-going     PT SHORT TERM GOAL #4   Title pt will improve R shoulder PROM/ AAROM flexion to >/= 100 degrees, abduction to >/= 90 degrees, ER/IR to >/= 45 degrees with </= 6/10 pain  to promote functional mobility (02/11/2016)   Time 4   Period Weeks   Status On-going           PT Long Term Goals - 01/12/16 1334      PT LONG TERM GOAL #1   Title pt will be I with all HEP as of last visit (03/08/2016)   Time 8   Period Weeks   Status New     PT LONG TERM GOAL #2   Title pt will improve R shoulder AROM flexion to >/= 120 degrees and abduction to >/= 100 degrees and ER/ IR >/= 60 degrees with </= 3/10 pain for donning/ doffing clothing, and grooming/doing her hair (03/08/2016)   Time 8   Period Weeks   Status New     PT LONG TERM GOAL #3   Title pt will improve R shoulder strength to >/= 4-/5 in all planes with </= 3/10 pain for funcitonal lifting and carrying activities (03/08/2016)   Time 8   Period Weeks   Status New     PT LONG TERM GOAL #4   Title pt will be able to lift and carrying >/= 8# as well as lift and lower from shelf / cabinet at or above head height with </= 3/10 pain for functional ADLS (03/08/2016)   Time 8   Period Weeks     PT LONG TERM GOAL #5   Title pt will improve her FOTO score to </= 50% limited to demontrate increaes in function at discharge (03/08/2016)   Baseline 68% limited   Time 6   Period Weeks   Status New               Plan - 02/03/16 1018    Clinical Impression Statement pt reports 8/10 paint today. utilized increased pulley exercise and gentle Nu-step activity to demonstrate movement will improve function while decrease pain. performed isometric exercise today which she perofrmed well with only report of 3/10 pain post session.,    PT Treatment/Interventions ADLs/Self Care Home Management;Cryotherapy;Electrical Stimulation;Iontophoresis 4mg /ml Dexamethasone;Moist Heat;Ultrasound;Passive range of motion;Patient/family education;Vasopneumatic Device;Scar mobilization;Therapeutic exercise;Therapeutic activities;Manual techniques;Taping   PT Next Visit Plan Nu-step to work on slowly progressing motion PROM,  scapular retraction, pulleys0(focus passive) Pt is 5 weeks s/p surgery, if tolerated isometrics give as HEP   Consulted and Agree with Plan of Care Patient  Patient will benefit from skilled therapeutic intervention in order to improve the following deficits and impairments:  Pain, Impaired flexibility, Improper body mechanics, Postural dysfunction, Decreased endurance, Decreased range of motion, Increased edema, Decreased strength, Increased fascial restricitons, Increased muscle spasms, Decreased activity tolerance, Decreased mobility, Decreased scar mobility  Visit Diagnosis: Acute pain of right shoulder  Localized edema  Stiffness of right shoulder, not elsewhere classified  Abnormal posture  Muscle weakness (generalized)     Problem List Patient Active Problem List   Diagnosis Date Noted  . History of pulmonary embolus (PE) 12/15/2015  . S/P cervical spinal fusion 03/04/2015  . Obstructive apnea 12/08/2014  . 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 Tingling 08/19/2014  . Limb weakness 08/19/2014  . Muscle weakness (generalized) 08/19/2014  . Muscle ache 07/25/2014  . Arthralgia of multiple joints 07/24/2014  . Encounter for general adult medical examination without abnormal findings 07/24/2014  . Migraine without aura and responsive to treatment 06/09/2014  . Abdominal pain, generalized 01/31/2014  . Chronic headache 01/30/2014  . Cannot sleep 06/24/2013  . 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 Essential (primary) hypertension 05/13/2013  . Digestive disorder 05/13/2013  . H/O injury, presenting hazards to health 05/13/2013  . Bipolar affective disorder (HCC)   . Bipolar I disorder, most recent episode depressed (HCC) 11/15/2012  . PTSD (post-traumatic stress disorder) 11/15/2012   Lulu RidingKristoffer Candra Wegner PT, DPT, LAT, ATC  02/03/16  11:04 AM      [facility] [address] [place], [place], [id number] Phone: [phone]   Fax:  [phone]  Name: Darleene Cleaverricka Govea MRN: [id number] Date of Birth: 04/30/1970

## 2016-02-05 ENCOUNTER — Ambulatory Visit: Payer: Medicare Other | Admitting: Physical Therapy

## 2016-02-05 DIAGNOSIS — M25611 Stiffness of right shoulder, not elsewhere classified: Secondary | ICD-10-CM

## 2016-02-05 DIAGNOSIS — R293 Abnormal posture: Secondary | ICD-10-CM

## 2016-02-05 DIAGNOSIS — M25511 Pain in right shoulder: Secondary | ICD-10-CM

## 2016-02-05 DIAGNOSIS — M6281 Muscle weakness (generalized): Secondary | ICD-10-CM

## 2016-02-05 DIAGNOSIS — R600 Localized edema: Secondary | ICD-10-CM

## 2016-02-05 NOTE — Therapy (Signed)
[facility] Outpatient Rehabilitation [facility] [address] [place], [place], [address] Phone: [phone]   Fax:  [phone]  Physical Therapy Treatment  Patient Details  Name: Karen [name] MRN: [id number] Date of Birth: 08/29/1970 Referring Provider: Baltazar Apo Md  Encounter Date: 02/05/2016      PT End of Session - 02/05/16 1028    Visit Number 9   Number of Visits 17   Date for PT Re-Evaluation 03/08/16   Authorization Type Medicare, Kx mod by 15th visit, progress note by 10th visit.    PT Start Time 1016   PT Stop Time 1109   PT Time Calculation (min) 53 min   Activity Tolerance Patient tolerated treatment well   Behavior During Therapy WFL for tasks assessed/performed      Past Medical History:  Diagnosis Date  . Anemia   . Anxiety   . Arthritis   . Bipolar affective disorder (HCC)   . Bowel obstruction   . Depression   . DVT (deep venous thrombosis) (HCC)   . GERD (gastroesophageal reflux disease)   . Hypertension   . IBS (irritable bowel syndrome)   . Migraine   . PTSD (post-traumatic stress disorder)   . Pulmonary embolism [facility])     Past Surgical History:  Procedure Laterality Date  . ABDOMINAL HYSTERECTOMY    . ABDOMINAL SURGERY    . ANTERIOR CERVICAL DECOMP/DISCECTOMY FUSION N/A 03/04/2015   Procedure: ANTERIOR CERVICAL DECOMPRESSION/DISCECTOMY FUSION CERVICAL SIX-SEVEN;  Surgeon: Tia Alert, MD;  Location: [facility];  Service: Neurosurgery;  Laterality: N/A;  . APPENDECTOMY    . CHOLECYSTECTOMY    . KNEE ARTHROSCOPY    . ROTATOR CUFF REPAIR    . TUBAL LIGATION      There were no vitals filed for this visit.      Subjective Assessment - 02/05/16 1015    Subjective "I am doing alittle better today reported at a 6/10"    Currently in Pain? Yes   Pain Score 6    Pain Orientation Right   Pain Descriptors / Indicators Aching;Tightness   Pain Onset More than a month ago   Pain Frequency Constant            [facility]  PT Assessment - 02/05/16 0001      Observation/Other Assessments   Focus on Therapeutic Outcomes (FOTO)  56% limited                     [facility] Adult PT Treatment/Exercise - 02/05/16 1019      Shoulder Exercises: Pulleys   Flexion 2 minutes   ABduction 2 minutes     Shoulder Exercises: ROM/Strengthening   UBE (Upper Arm Bike) Nu-step with UE/LE L 3 x 8 min   Other ROM/Strengthening Exercises wall ladder flexion x 5      Shoulder Exercises: Isometric Strengthening   Flexion 5X10"   Extension 5X10"   External Rotation 5X10"   Internal Rotation 5X10"   ABduction 5X10"     Manual Therapy   Manual Therapy Scapular mobilization   Scapular Mobilization R scapular mobs in all directions                PT Education - 02/05/16 1055    Education provided Yes   Education Details updated HEP for isometric exercises for the shoulder   Person(s) Educated Patient   Methods Explanation;Verbal cues   Comprehension Verbalized understanding;Verbal cues required          PT  Short Term Goals - 02/05/16 1110      PT SHORT TERM GOAL #1   Title pt will be I with inital HEP (02/11/2016)   Time 4   Period Weeks   Status Achieved     PT SHORT TERM GOAL #2   Title pt will be able to verbalize and demo techniques to reduce R shoulder pain and inflammation via RICE (02/11/2016)   Time 4   Period Weeks   Status On-going     PT SHORT TERM GOAL #3   Title pt will improve R grip strength to >/= 15 # to demonstrating improving R shoulder function (02/11/2016)   Time 4   Period Weeks   Status On-going     PT SHORT TERM GOAL #4   Title pt will improve R shoulder PROM/ AAROM flexion to >/= 100 degrees, abduction to >/= 90 degrees, ER/IR to >/= 45 degrees with </= 6/10 pain to promote functional mobility (02/11/2016)   Time 4   Period Weeks   Status On-going           PT Long Term Goals - 01/12/16 1334      PT LONG TERM GOAL #1   Title pt will be I with all HEP  as of last visit (03/08/2016)   Time 8   Period Weeks   Status New     PT LONG TERM GOAL #2   Title pt will improve R shoulder AROM flexion to >/= 120 degrees and abduction to >/= 100 degrees and ER/ IR >/= 60 degrees with </= 3/10 pain for donning/ doffing clothing, and grooming/doing her hair (03/08/2016)   Time 8   Period Weeks   Status New     PT LONG TERM GOAL #3   Title pt will improve R shoulder strength to >/= 4-/5 in all planes with </= 3/10 pain for funcitonal lifting and carrying activities (03/08/2016)   Time 8   Period Weeks   Status New     PT LONG TERM GOAL #4   Title pt will be able to lift and carrying >/= 8# as well as lift and lower from shelf / cabinet at or above head height with </= 3/10 pain for functional ADLS (03/08/2016)   Time 8   Period Weeks     PT LONG TERM GOAL #5   Title pt will improve her FOTO score to </= 50% limited to demontrate increaes in function at discharge (03/08/2016)   Baseline 68% limited   Time 6   Period Weeks   Status New               Plan - 02/05/16 1108    Clinical Impression Statement Mrs. Sweetland reports 6/10 pain today. continued Nu-step to working on motion and pulleys adding wall ladder for flexion. added isometrics to HEP, worked on Energy manager mobility which she reported decreased pain but required multiple verbal cues to relax. continued ice post session to calm down soreness.    PT Next Visit Plan Nu-step to work on slowly progressing motion PROM, scapular retraction, pulleys0(focus passive) Pt is 5 weeks s/p surgery.   PT Home Exercise Plan bicep curls with no weight, shoulder isometrics   Consulted and Agree with Plan of Care Patient      Patient will benefit from skilled therapeutic intervention in order to improve the following deficits and impairments:  Pain, Impaired flexibility, Improper body mechanics, Postural dysfunction, Decreased endurance, Decreased range of motion, Increased edema, Decreased  strength, Increased fascial restricitons, Increased muscle spasms, Decreased activity tolerance, Decreased mobility, Decreased scar mobility  Visit Diagnosis: Acute pain of right shoulder  Localized edema  Stiffness of right shoulder, not elsewhere classified  Abnormal posture  Muscle weakness (generalized)     Problem List Patient Active Problem List   Diagnosis Date Noted  . History of pulmonary embolus (PE) 12/15/2015  . S/P cervical spinal fusion 03/04/2015  . Obstructive apnea 12/08/2014  . 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 Tingling 08/19/2014  . Limb weakness 08/19/2014  . Muscle weakness (generalized) 08/19/2014  . Muscle ache 07/25/2014  . Arthralgia of multiple joints 07/24/2014  . Encounter for general adult medical examination without abnormal findings 07/24/2014  . Migraine without aura and responsive to treatment 06/09/2014  . Abdominal pain, generalized 01/31/2014  . Chronic headache 01/30/2014  . Cannot sleep 06/24/2013  . 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 Essential (primary) hypertension 05/13/2013  . Digestive disorder 05/13/2013  . H/O injury, presenting hazards to health 05/13/2013  . Bipolar affective disorder (HCC)   . Bipolar I disorder, most recent episode depressed (HCC) 11/15/2012  . PTSD (post-traumatic stress disorder) 11/15/2012   Lulu RidingKristoffer Jessia Kief PT, DPT, LAT, ATC  02/05/16  11:12 AM      [facility] [address] [place], [place], [address] Phone: [phone]   Fax:  [phone]  Name: Darleene Cleaverricka Orner MRN: [id number] Date of Birth: 04/15/1971

## 2016-02-08 ENCOUNTER — Ambulatory Visit: Payer: Medicare Other | Admitting: Physical Therapy

## 2016-02-08 DIAGNOSIS — M25611 Stiffness of right shoulder, not elsewhere classified: Secondary | ICD-10-CM

## 2016-02-08 DIAGNOSIS — M25511 Pain in right shoulder: Secondary | ICD-10-CM | POA: Diagnosis not present

## 2016-02-08 DIAGNOSIS — R293 Abnormal posture: Secondary | ICD-10-CM

## 2016-02-08 DIAGNOSIS — R600 Localized edema: Secondary | ICD-10-CM

## 2016-02-08 DIAGNOSIS — M6281 Muscle weakness (generalized): Secondary | ICD-10-CM

## 2016-02-08 NOTE — Therapy (Addendum)
[facility] Outpatient Rehabilitation [facility] [address] [place], [place], [address] Phone: [phone]   Fax:  [phone]  Physical Therapy Treatment  Patient Details  Name: Karen [name] MRN: [id number] Date of Birth: 1970-11-28 Referring Provider: Baltazar Apo Md  Encounter Date: 02/08/2016      PT End of Session - 02/08/16 1108    Visit Number 10   Number of Visits 17   Date for PT Re-Evaluation 03/08/16   Authorization Type Medicare, Kx mod by 15th visit, progress note by 10th visit.    PT Start Time 1100   PT Stop Time 1153   PT Time Calculation (min) 53 min      Past Medical History:  Diagnosis Date  . Anemia   . Anxiety   . Arthritis   . Bipolar affective disorder (HCC)   . Bowel obstruction   . Depression   . DVT (deep venous thrombosis) (HCC)   . GERD (gastroesophageal reflux disease)   . Hypertension   . IBS (irritable bowel syndrome)   . Migraine   . PTSD (post-traumatic stress disorder)   . Pulmonary embolism [facility])     Past Surgical History:  Procedure Laterality Date  . ABDOMINAL HYSTERECTOMY    . ABDOMINAL SURGERY    . ANTERIOR CERVICAL DECOMP/DISCECTOMY FUSION N/A 03/04/2015   Procedure: ANTERIOR CERVICAL DECOMPRESSION/DISCECTOMY FUSION CERVICAL SIX-SEVEN;  Surgeon: Tia Alert, MD;  Location: [facility];  Service: Neurosurgery;  Laterality: N/A;  . APPENDECTOMY    . CHOLECYSTECTOMY    . KNEE ARTHROSCOPY    . ROTATOR CUFF REPAIR    . TUBAL LIGATION      There were no vitals filed for this visit.      Subjective Assessment - 02/08/16 1104    Subjective I am hurting in my shoulder and elbow. It's waking me out of my sleep    Currently in Pain? Yes   Pain Score 7    Pain Location Shoulder   Pain Orientation Right   Pain Descriptors / Indicators Aching;Tightness;Throbbing;Sharp   Pain Frequency Constant   Aggravating Factors  movements, trying to sleep    Pain Relieving Factors pain meds, ice                           [facility] Adult PT Treatment/Exercise - 02/08/16 0001      Shoulder Exercises: Pulleys   Flexion Other (comment)  5 min     Shoulder Exercises: ROM/Strengthening   UBE (Upper Arm Bike) Nu-step with UE/LE L 1 x 5 min     Shoulder Exercises: Isometric Strengthening   Flexion 5X10"   Extension 5X10"   External Rotation 5X10"   Internal Rotation 5X10"   ABduction 5X10"     Cryotherapy   Number Minutes Cryotherapy 10 Minutes   Cryotherapy Location Shoulder   Type of Cryotherapy Ice pack                  PT Short Term Goals - 02/05/16 1110      PT SHORT TERM GOAL #1   Title pt will be I with inital HEP (02/11/2016)   Time 4   Period Weeks   Status Achieved     PT SHORT TERM GOAL #2   Title pt will be able to verbalize and demo techniques to reduce R shoulder pain and inflammation via RICE (02/11/2016)   Time 4   Period Weeks   Status On-going  PT SHORT TERM GOAL #3   Title pt will improve R grip strength to >/= 15 # to demonstrating improving R shoulder function (02/11/2016)   Time 4   Period Weeks   Status On-going     PT SHORT TERM GOAL #4   Title pt will improve R shoulder PROM/ AAROM flexion to >/= 100 degrees, abduction to >/= 90 degrees, ER/IR to >/= 45 degrees with </= 6/10 pain to promote functional mobility (02/11/2016)   Time 4   Period Weeks   Status On-going           PT Long Term Goals - 01/12/16 1334      PT LONG TERM GOAL #1   Title pt will be I with all HEP as of last visit (03/08/2016)   Time 8   Period Weeks   Status New     PT LONG TERM GOAL #2   Title pt will improve R shoulder AROM flexion to >/= 120 degrees and abduction to >/= 100 degrees and ER/ IR >/= 60 degrees with </= 3/10 pain for donning/ doffing clothing, and grooming/doing her hair (03/08/2016)   Time 8   Period Weeks   Status New     PT LONG TERM GOAL #3   Title pt will improve R shoulder strength to >/= 4-/5 in all planes  with </= 3/10 pain for funcitonal lifting and carrying activities (03/08/2016)   Time 8   Period Weeks   Status New     PT LONG TERM GOAL #4   Title pt will be able to lift and carrying >/= 8# as well as lift and lower from shelf / cabinet at or above head height with </= 3/10 pain for functional ADLS (03/08/2016)   Time 8   Period Weeks     PT LONG TERM GOAL #5   Title pt will improve her FOTO score to </= 50% limited to demontrate increaes in function at discharge (03/08/2016)   Baseline 68% limited   Time 6   Period Weeks   Status New               Plan - Feb 16, 2016 1153    Clinical Impression Statement Pt  increased pain today. Decrease tolerance to therex. She has not perfromed isometrics given as HEP. Reviewed them today and instructed in seated isometrics using opposite UE for resistance. She reports increased pain with isometrics today. Continued ROM exercises with Nustep and pulleys. Suggested over door pulleys for home. Pt receptive to this idea and plans to check into ordering some for home. Light weight ie pack used in sitting today.    PT Next Visit Plan Nu-step to work on slowly progressing motion PROM, scapular retraction, pulleys0(focus passive) Pt is 4 weeks s/p surgery.      Patient will benefit from skilled therapeutic intervention in order to improve the following deficits and impairments:  Pain, Impaired flexibility, Improper body mechanics, Postural dysfunction, Decreased endurance, Decreased range of motion, Increased edema, Decreased strength, Increased fascial restricitons, Increased muscle spasms, Decreased activity tolerance, Decreased mobility, Decreased scar mobility  Visit Diagnosis: Acute pain of right shoulder  Localized edema  Stiffness of right shoulder, not elsewhere classified  Abnormal posture  Muscle weakness (generalized)         G-Codes - 02/16/2016 1503    Functional Assessment Tool Used FOTO / clinical judgement   Functional  Limitation Carrying, moving and handling objects   Carrying, Moving and Handling Objects Current Status (Z6109) At least  40 percent but less than 60 percent impaired, limited or restricted   Carrying, Moving and Handling Objects Goal Status (Z6109(G8985) At least 40 percent but less than 60 percent impaired, limited or restricted       Problem List Patient Active Problem List   Diagnosis Date Noted  . History of pulmonary embolus (PE) 12/15/2015  . S/P cervical spinal fusion 03/04/2015  . Obstructive apnea 12/08/2014  . 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 Tingling 08/19/2014  . Limb weakness 08/19/2014  . Muscle weakness (generalized) 08/19/2014  . Muscle ache 07/25/2014  . Arthralgia of multiple joints 07/24/2014  . Encounter for general adult medical examination without abnormal findings 07/24/2014  . Migraine without aura and responsive to treatment 06/09/2014  . Abdominal pain, generalized 01/31/2014  . Chronic headache 01/30/2014  . Cannot sleep 06/24/2013  . 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 Essential (primary) hypertension 05/13/2013  . Digestive disorder 05/13/2013  . H/O injury, presenting hazards to health 05/13/2013  . Bipolar affective disorder (HCC)   . Bipolar I disorder, most recent episode depressed (HCC) 11/15/2012  . PTSD (post-traumatic stress disorder) 11/15/2012    Sherrie Mustacheonoho, Tema Alire McGee, PTA 02/08/2016, 11:57 AM  [facility] [address] [place], [place], [address] Phone: [phone]   Fax:  [phone]  Name: Karen Cleaverricka Pence MRN: [id number] Date of Birth: 07/01/1970   Lulu RidingKristoffer Leamon PT, DPT, LAT, ATC  02/08/16  3:06 PM

## 2016-02-10 ENCOUNTER — Ambulatory Visit: Payer: Medicare Other | Admitting: Physical Therapy

## 2016-02-10 DIAGNOSIS — M25511 Pain in right shoulder: Secondary | ICD-10-CM

## 2016-02-10 DIAGNOSIS — M25611 Stiffness of right shoulder, not elsewhere classified: Secondary | ICD-10-CM

## 2016-02-10 DIAGNOSIS — R293 Abnormal posture: Secondary | ICD-10-CM

## 2016-02-10 DIAGNOSIS — R600 Localized edema: Secondary | ICD-10-CM

## 2016-02-10 DIAGNOSIS — M6281 Muscle weakness (generalized): Secondary | ICD-10-CM

## 2016-02-10 NOTE — Therapy (Signed)
Karen [name], [place], [address] Phone: [phone]   Fax:  [phone]  Physical Therapy Treatment  Patient Details  Name: Karen [name] MRN: [id number] Date of Birth: Jan 14, 1971 Referring Provider: Harvest Dark Md  Encounter Date: 02/10/2016      PT End of Session - 02/10/16 1054    Visit Number 11   Number of Visits 17   Date for PT Re-Evaluation 03/08/16   Authorization Type Medicare, Kx mod by 15th visit, progress note by 10th visit.    PT Start Time 1052   PT Stop Time 1158   PT Time Calculation (min) 66 min      Past Medical History:  Diagnosis Date  . Anemia   . Anxiety   . Arthritis   . Bipolar affective disorder ([place])   . Bowel obstruction   . Depression   . DVT (deep venous thrombosis) ([place])   . GERD (gastroesophageal reflux disease)   . Hypertension   . IBS (irritable bowel syndrome)   . Migraine   . PTSD (post-traumatic stress disorder)   . Pulmonary embolism [facility])     Past Surgical History:  Procedure Laterality Date  . ABDOMINAL HYSTERECTOMY    . ABDOMINAL SURGERY    . ANTERIOR CERVICAL DECOMP/DISCECTOMY FUSION N/A 03/04/2015   Procedure: ANTERIOR CERVICAL DECOMPRESSION/DISCECTOMY FUSION CERVICAL SIX-SEVEN;  Surgeon: Eustace Moore, MD;  Location: [place] NEUR[facility] ORS;  Service: Neurosurgery;  Laterality: N/A;  . APPENDECTOMY    . CHOLECYSTECTOMY    . KNEE ARTHROSCOPY    . ROTATOR CUFF REPAIR    . TUBAL LIGATION      There were no vitals filed for this visit.      Subjective Assessment - 02/10/16 1054    Subjective No better. I am having bad mucsle spasms. 5 mg muscle relaxers do not help.    Currently in Pain? Yes   Pain Score 7    Pain Location Shoulder   Pain Orientation Right   Pain Descriptors / Indicators Aching;Tightness;Throbbing;Burning;Sharp   Pain Radiating Towards Right arm             [facility] PT Assessment - 02/10/16 0001      Strength   Right Hand Grip  (lbs) 21,22   Left Hand Grip (lbs) 35, 36                     [facility] Adult PT Treatment/Exercise - 02/10/16 0001      Shoulder Exercises: Standing   Other Standing Exercises Ue ranger at highest level floor (reaching less than 90 degrees of flexion) x 20, horizontal abduction and adduction x 20      Shoulder Exercises: Pulleys   Flexion 3 minutes   ABduction 1 minute  scaption     Shoulder Exercises: ROM/Strengthening   Other ROM/Strengthening Exercises attempted supine cane press- did not toerate.      Shoulder Exercises: Isometric Strengthening   Flexion 5X10"   Extension 5X10"   External Rotation 5X10"   Internal Rotation 5X10"   ABduction 5X10"     Modalities   Modalities Moist Heat     Moist Heat Therapy   Number Minutes Moist Heat 15 Minutes   Moist Heat Location --  upper trap      Cryotherapy   Cryotherapy Location --  upper traps     Manual Therapy   Passive ROM attempted flexion, ER - not tolerated today  PT Short Term Goals - 02/10/16 1125      PT SHORT TERM GOAL #1   Title pt will be I with inital HEP (02/11/2016)   Time 4   Period Weeks   Status Achieved     PT SHORT TERM GOAL #2   Title pt will be able to verbalize and demo techniques to reduce R shoulder pain and inflammation via RICE (02/11/2016)   Time 4   Period Weeks   Status Achieved     PT SHORT TERM GOAL #3   Title pt will improve R grip strength to >/= 15 # to demonstrating improving R shoulder function (02/11/2016)   Baseline 22   Time 4   Period Weeks   Status Achieved     PT SHORT TERM GOAL #4   Title pt will improve R shoulder PROM/ AAROM flexion to >/= 100 degrees, abduction to >/= 90 degrees, ER/IR to >/= 45 degrees with </= 6/10 pain to promote functional mobility (02/11/2016)   Time 4   Period Weeks   Status Partially Met           PT Long Term Goals - 01/12/16 1334      PT LONG TERM GOAL #1   Title pt will be I with all  HEP as of last visit (03/08/2016)   Time 8   Period Weeks   Status New     PT LONG TERM GOAL #2   Title pt will improve R shoulder AROM flexion to >/= 120 degrees and abduction to >/= 100 degrees and ER/ IR >/= 60 degrees with </= 3/10 pain for donning/ doffing clothing, and grooming/doing her hair (03/08/2016)   Time 8   Period Weeks   Status New     PT LONG TERM GOAL #3   Title pt will improve R shoulder strength to >/= 4-/5 in all planes with </= 3/10 pain for funcitonal lifting and carrying activities (03/08/2016)   Time 8   Period Weeks   Status New     PT LONG TERM GOAL #4   Title pt will be able to lift and carrying >/= 8# as well as lift and lower from shelf / cabinet at or above head height with </= 3/10 pain for functional ADLS (84/69/6295)   Time 8   Period Weeks     PT LONG TERM GOAL #5   Title pt will improve her FOTO score to </= 50% limited to demontrate increaes in function at discharge (03/08/2016)   Baseline 68% limited   Time 6   Period Weeks   Status New               Plan - 02/10/16 1333    Clinical Impression Statement Pain increased over last 3 days. Pain is radiating to Right hand and fingers. Pt reports increased pain with all exercises.  Encouraged her to call MD if high levels of pain persist. Her grip strength has improved to 21# (2# on eval). STG#3 Met.    PT Next Visit Plan Nu-step to work on slowly progressing motion PROM, scapular retraction, pulleys0(focus passive) Pt is 5 weeks s/p surgery.      Patient will benefit from skilled therapeutic intervention in order to improve the following deficits and impairments:  Pain, Impaired flexibility, Improper body mechanics, Postural dysfunction, Decreased endurance, Decreased range of motion, Increased edema, Decreased strength, Increased fascial restricitons, Increased muscle spasms, Decreased activity tolerance, Decreased mobility, Decreased scar mobility  Visit Diagnosis: Acute pain of  right  shoulder  Localized edema  Stiffness of right shoulder, not elsewhere classified  Abnormal posture  Muscle weakness (generalized)     Problem List Patient Active Problem List   Diagnosis Date Noted  . History of pulmonary embolus (PE) 12/15/2015  . S/P cervical spinal fusion 03/04/2015  . Obstructive apnea 12/08/2014  . 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 Tingling 08/19/2014  . Limb weakness 08/19/2014  . Muscle weakness (generalized) 08/19/2014  . Muscle ache 07/25/2014  . Arthralgia of multiple joints 07/24/2014  . Encounter for general adult medical examination without abnormal findings 07/24/2014  . Migraine without aura and responsive to treatment 06/09/2014  . Abdominal pain, generalized 01/31/2014  . Chronic headache 01/30/2014  . Cannot sleep 06/24/2013  . 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 Essential (primary) hypertension 05/13/2013  . Digestive disorder 05/13/2013  . H/O injury, presenting hazards to health 05/13/2013  . Bipolar affective disorder ([place])   . Bipolar I disorder, most recent episode depressed ([place]) 11/15/2012  . PTSD (post-traumatic stress disorder) 11/15/2012    Dorene Ar, PTA 02/10/2016, 1:56 PM  [facility] [address] [place], [place], [address] Phone: [phone]   Fax:  [phone]  Name: Brittiany Wiehe MRN: [id number] Date of Birth: 1970/12/26

## 2016-02-12 ENCOUNTER — Ambulatory Visit: Payer: Medicare Other | Admitting: Physical Therapy

## 2016-02-12 DIAGNOSIS — R293 Abnormal posture: Secondary | ICD-10-CM

## 2016-02-12 DIAGNOSIS — R600 Localized edema: Secondary | ICD-10-CM

## 2016-02-12 DIAGNOSIS — M25511 Pain in right shoulder: Secondary | ICD-10-CM | POA: Diagnosis not present

## 2016-02-12 DIAGNOSIS — M6281 Muscle weakness (generalized): Secondary | ICD-10-CM

## 2016-02-12 DIAGNOSIS — M25611 Stiffness of right shoulder, not elsewhere classified: Secondary | ICD-10-CM

## 2016-02-12 NOTE — Patient Instructions (Signed)
Flexion (Eccentric) - Active-Assist (Cane)   Use unaffected arm to push affected arm forward. Avoid hiking shoulder. Keep palm relaxed. Slowly lower affected arm for 3-5 seconds, increasing use of affected arm. _1__ reps per set, _2__ sets per day, __7_ days per week.    Cane Exercise: Abduction   Hold cane with right hand over end, palm-up, with other hand palm-down. Move arm out from side and up by pushing with other arm. Hold _5___ seconds. Repeat ___10_ times. Do __2__ sessions per day.  http://[email or web address].exer.us/81   Copyright  VHI. All rights reserved.      Cane Exercise: Extension / Internal Rotation   Stand holding cane behind back with both hands palm-up. Slide cane up spine toward head. Hold __5__ seconds. Repeat __10__ times. Do ___2_ sessions per day.  http://[email or web address].exer.us/85   Copyright  VHI. All rights reserved.  [place] Mor.  Cane Exercise: Extension   Stand holding cane behind back with both hands palm-up. Lift the cane away from body. Hold _5___ seconds. Repeat __10__ times. Do _2___ sessions per day.  SHOULDER: External Rotation - Supine (Cane)     PERFORM IN STANDING Hold cane with both hands. Rotate arm away from body. Keep elbow on floor and next to body. _10__ reps per set, _2__ sets per day  Copyright  VHI. All rights reserved.

## 2016-02-14 IMAGING — DX DG CHEST 2V
2 series · 2 of 2 positions shown · non-contrast
Comparison: 02/24/2015.

CLINICAL DATA: Initial encounter for shortness of breath with left
leg pain and edema x3 days.

EXAM:
CHEST  2 VIEW

[chest pa]
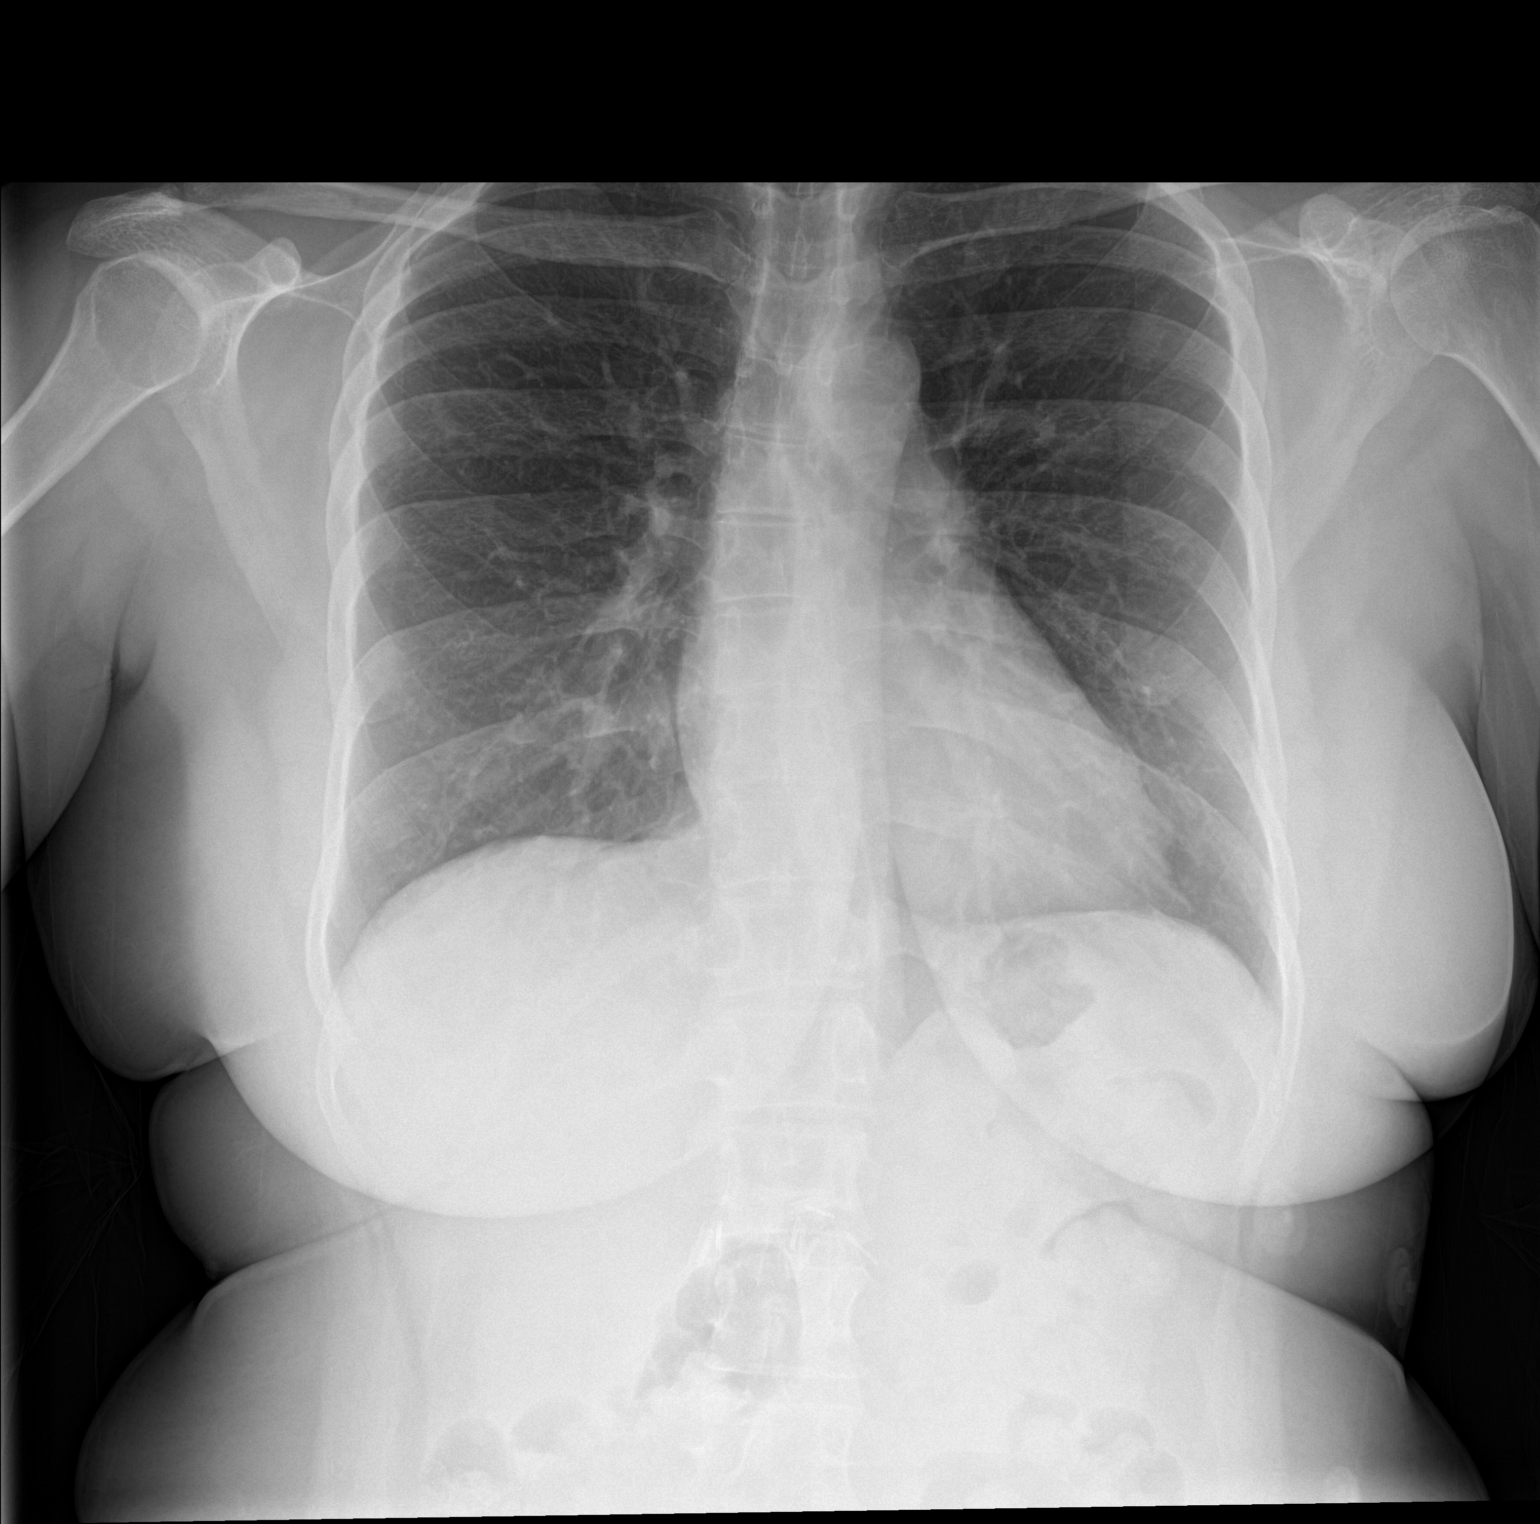

[chest lat]
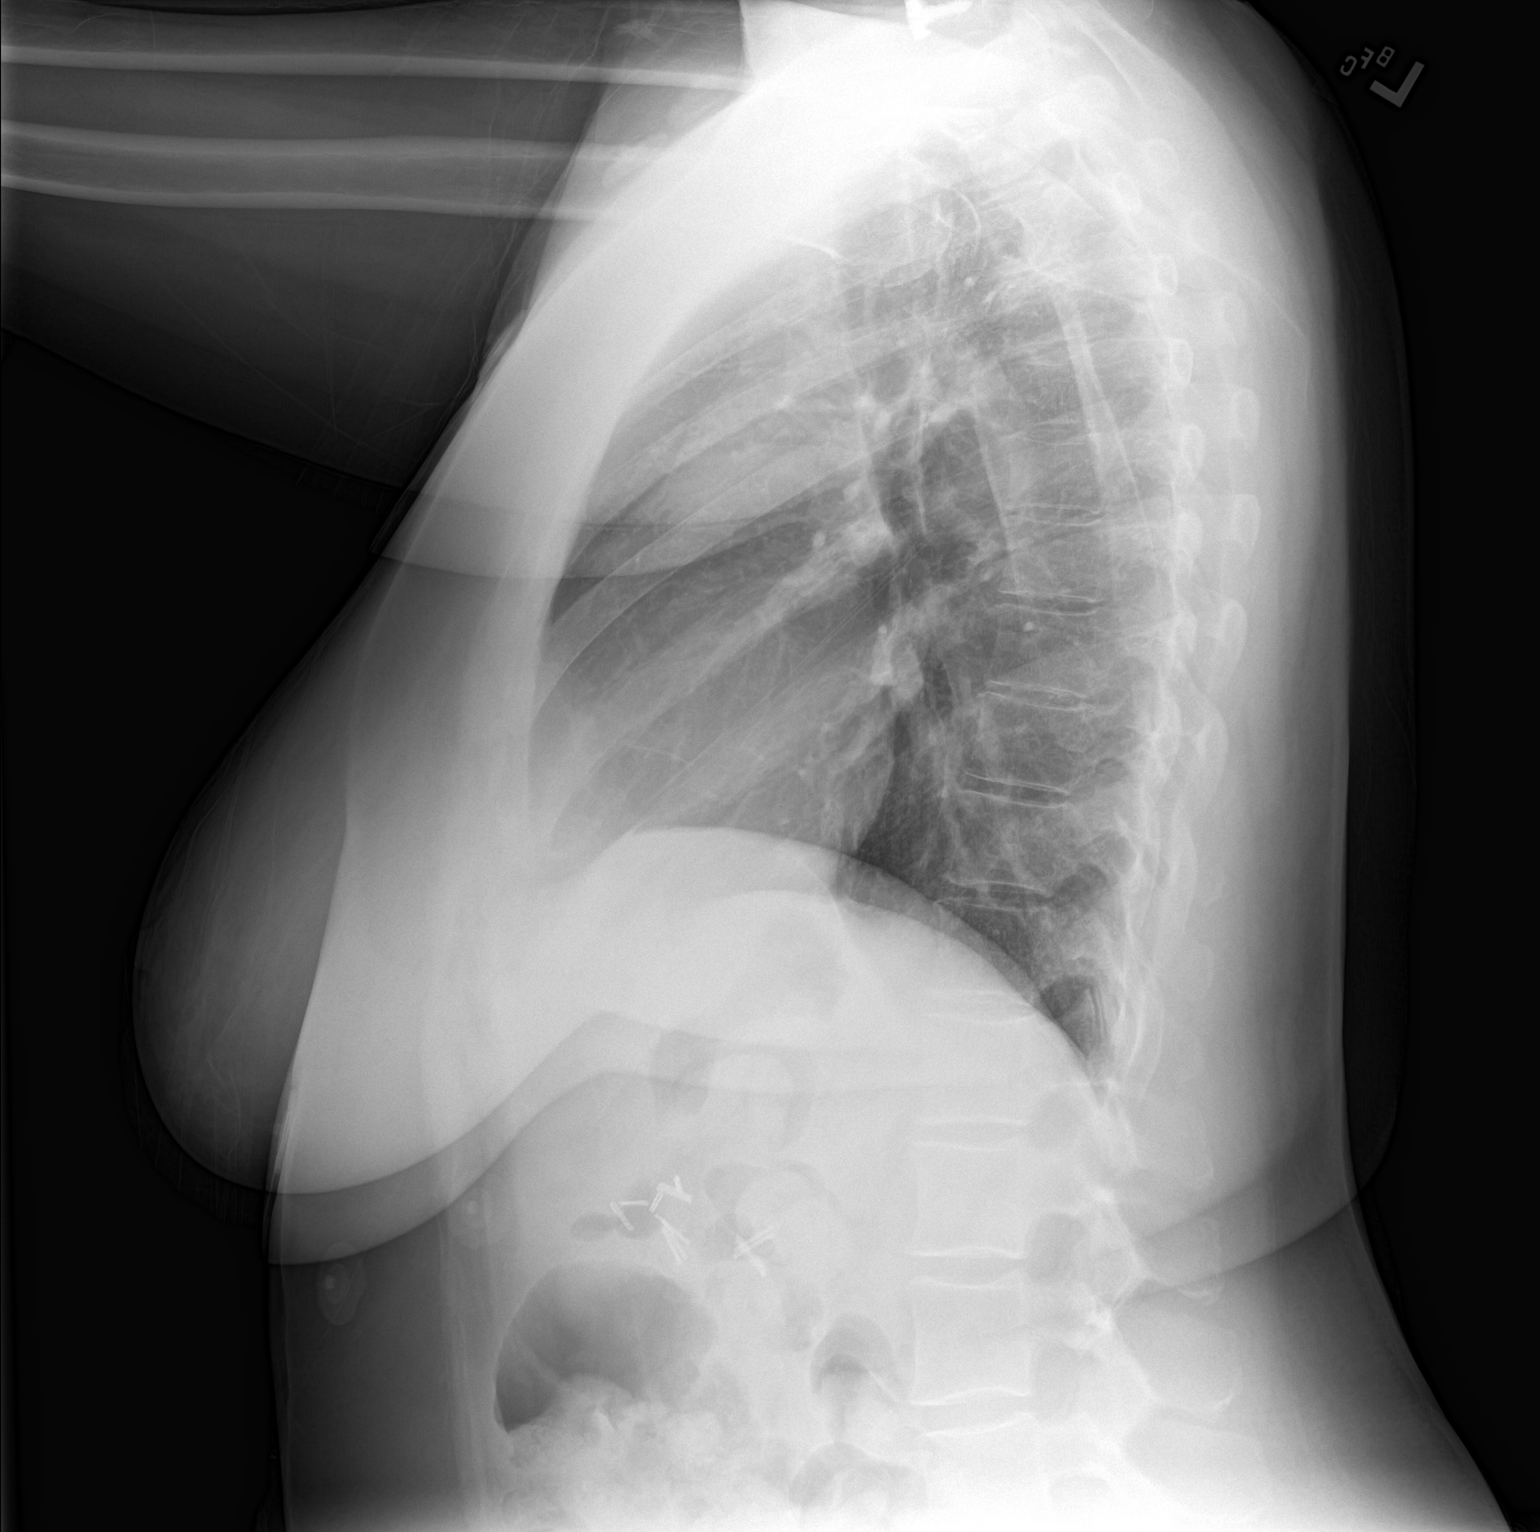

[2 of 2 positions shown; findings below may reference images not displayed]

FINDINGS: The heart size and mediastinal contours are within normal limits.
Both lungs are clear. The visualized skeletal structures are
unremarkable.
IMPRESSION: No active cardiopulmonary disease.

## 2016-02-15 ENCOUNTER — Ambulatory Visit: Payer: Medicare Other | Admitting: Physical Therapy

## 2016-02-15 NOTE — Therapy (Addendum)
[place], [place], [address] Phone: [phone]   Fax:  [phone]  Physical Therapy Treatment / Discharge Note  Patient Details  Name: Karen [name] MRN: [id number] Date of Birth: October 16, 1970 Referring Provider: Harvest Dark Md  Encounter Date: 02/12/2016   End of session: Visit #  : 12 Total # of visits:               17 Date for Re-eval:             03/08/2016  Time In:                            1100 Time Out:                         0093 Total Minutes:                   55 min    Past Medical History:  Diagnosis Date  . Anemia   . Anxiety   . Arthritis   . Bipolar affective disorder ([place])   . Bowel obstruction   . Depression   . DVT (deep venous thrombosis) ([place])   . GERD (gastroesophageal reflux disease)   . Hypertension   . IBS (irritable bowel syndrome)   . Migraine   . PTSD (post-traumatic stress disorder)   . Pulmonary embolism [facility])     Past Surgical History:  Procedure Laterality Date  . ABDOMINAL HYSTERECTOMY    . ABDOMINAL SURGERY    . ANTERIOR CERVICAL DECOMP/DISCECTOMY FUSION N/A 03/04/2015   Procedure: ANTERIOR CERVICAL DECOMPRESSION/DISCECTOMY FUSION CERVICAL SIX-SEVEN;  Surgeon: Eustace Moore, MD;  Location: [facility];  Service: Neurosurgery;  Laterality: N/A;  . APPENDECTOMY    . CHOLECYSTECTOMY    . KNEE ARTHROSCOPY    . ROTATOR CUFF REPAIR    . TUBAL LIGATION      There were no vitals filed for this visit.                       [facility] Adult PT Treatment/Exercise - 02/15/16 0001      Shoulder Exercises: Pulleys   Flexion 3 minutes   ABduction 1 minute  scaption     Shoulder Exercises: ROM/Strengthening   Other ROM/Strengthening Exercises standing cane flexion x 10 , ER x 10, abduction x 10 , extension x 10     Shoulder Exercises: Isometric Strengthening   Flexion 5X10"   Extension 5X10"   External Rotation 5X10"   Internal Rotation  5X10"   ABduction 5X10"     Moist Heat Therapy   Number Minutes Moist Heat 15 Minutes   Moist Heat Location --  upper trap                   PT Short Term Goals - 02/10/16 1125      PT SHORT TERM GOAL #1   Title pt will be I with inital HEP (02/11/2016)   Time 4   Period Weeks   Status Achieved     PT SHORT TERM GOAL #2   Title pt will be able to verbalize and demo techniques to reduce R shoulder pain and inflammation via RICE (02/11/2016)   Time 4   Period Weeks   Status Achieved     PT SHORT TERM GOAL #3   Title pt will improve R grip  strength to >/= 15 # to demonstrating improving R shoulder function (02/11/2016)   Baseline 22   Time 4   Period Weeks   Status Achieved     PT SHORT TERM GOAL #4   Title pt will improve R shoulder PROM/ AAROM flexion to >/= 100 degrees, abduction to >/= 90 degrees, ER/IR to >/= 45 degrees with </= 6/10 pain to promote functional mobility (02/11/2016)   Time 4   Period Weeks   Status Partially Met           PT Long Term Goals - 01/12/16 1334      PT LONG TERM GOAL #1   Title pt will be I with all HEP as of last visit (03/08/2016)   Time 8   Period Weeks   Status New     PT LONG TERM GOAL #2   Title pt will improve R shoulder AROM flexion to >/= 120 degrees and abduction to >/= 100 degrees and ER/ IR >/= 60 degrees with </= 3/10 pain for donning/ doffing clothing, and grooming/doing her hair (03/08/2016)   Time 8   Period Weeks   Status New     PT LONG TERM GOAL #3   Title pt will improve R shoulder strength to >/= 4-/5 in all planes with </= 3/10 pain for funcitonal lifting and carrying activities (03/08/2016)   Time 8   Period Weeks   Status New     PT LONG TERM GOAL #4   Title pt will be able to lift and carrying >/= 8# as well as lift and lower from shelf / cabinet at or above head height with </= 3/10 pain for functional ADLS (96/78/9381)   Time 8   Period Weeks     PT LONG TERM GOAL #5   Title pt will  improve her FOTO score to </= 50% limited to demontrate increaes in function at discharge (03/08/2016)   Baseline 68% limited   Time 6   Period Weeks   Status New         Clinical Impression Statement:  Pt reports continued 7/10 pain. She is able to tolerate all exercises without increased pain. Began standing cane exercises. Good ROM in all planes without increased pain. Added to HEP. Asked to to stop if more painful at home.   Plan: Review standing cane; Nu-step to work on slowly progressing motion PROM, scapular retraction, pulleys0(focus passive) Pt is 5 weeks s/p surgery  PT Home Exercise Plan:  pendulums, grip, shoulder rolls, shoulder squeezes, bicep curls with no weight, shoulder isometrics; standing cane      Patient will benefit from skilled therapeutic intervention in order to improve the following deficits and impairments:  Pain, Impaired flexibility, Improper body mechanics, Postural dysfunction, Decreased endurance, Decreased range of motion, Increased edema, Decreased strength, Increased fascial restricitons, Increased muscle spasms, Decreased activity tolerance, Decreased mobility, Decreased scar mobility  Visit Diagnosis: Acute pain of right shoulder  Localized edema  Stiffness of right shoulder, not elsewhere classified  Abnormal posture  Muscle weakness (generalized)     Problem List Patient Active Problem List   Diagnosis Date Noted  . History of pulmonary embolus (PE) 12/15/2015  . S/P cervical spinal fusion 03/04/2015  . Obstructive apnea 12/08/2014  . 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 Tingling 08/19/2014  . Limb weakness 08/19/2014  . Muscle weakness (generalized) 08/19/2014  . Muscle ache 07/25/2014  . Arthralgia of multiple joints 07/24/2014  . Encounter for general adult medical examination without  abnormal findings 07/24/2014  . Migraine without aura and responsive to treatment 06/09/2014  . Abdominal pain, generalized 01/31/2014   . Chronic headache 01/30/2014  . Cannot sleep 06/24/2013  . 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 Essential (primary) hypertension 05/13/2013  . Digestive disorder 05/13/2013  . H/O injury, presenting hazards to health 05/13/2013  . Bipolar affective disorder ([place])   . Bipolar I disorder, most recent episode depressed ([place]) 11/15/2012  . PTSD (post-traumatic stress disorder) 11/15/2012    Dorene Ar, PTA 02/15/2016, 9:39 AM  [facility] [address] [place], [place], [address] Phone: [phone]   Fax:  [phone]  Name: Karen [name] MRN: [id number] Date of Birth: 07-26-1970   PHYSICAL THERAPY DISCHARGE SUMMARY  Visits from Start of Care: 12  Current functional level related to goals / functional outcomes: See goals   Remaining deficits: Unknown due to pt not returning.    Education / Equipment: HEP, Water engineer  Plan: Patient agrees to discharge.  Patient goals were not met. Patient is being discharged due to meeting the stated rehab goals.  ?????       Kristoffer Leamon PT, DPT, LAT, ATC  04/07/16  3:45 PM

## 2016-02-29 ENCOUNTER — Ambulatory Visit: Payer: Medicare Other | Admitting: Physical Therapy

## 2016-03-16 ENCOUNTER — Ambulatory Visit: Payer: Self-pay | Admitting: Pulmonary Disease

## 2016-08-04 IMAGING — RF DG UGI W/ SMALL BOWEL HIGH DENSITY
11 of 18 series · 13 of 24 positions shown · non-contrast
Comparison: None.

CLINICAL DATA: 45-year-old female with history of intermittent
constipation and diarrhea for several years. Your double bowel
syndrome. Right lower quadrant abdominal discomfort, nausea and
vomiting over the past several weeks.

EXAM:
UPPER GI SERIES WITH SMALL BOWEL FOLLOW-THROUGH
FLUOROSCOPY TIME:  If the device does not provide the exposure
index:
Fluoroscopy Time (in minutes and seconds):  6 minutes and 24 seconds
TECHNIQUE: Combined double contrast and single contrast upper GI series using
effervescent crystals, thick barium, and thin barium. Subsequently,
serial images of the small bowel were obtained including spot views
of the terminal ileum.

[Series 1: t abdomen supine · 0.15mm/px · 1 of 1 slices shown]
[im 1/1]
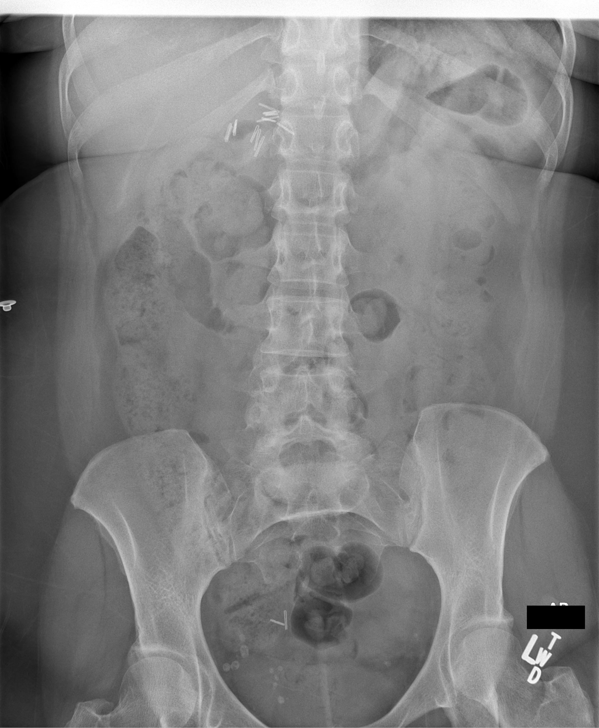

[Series 4: fluoro_barium swallow 2fps_bw · 0.17mm/px · 1 of 1 slices shown (1 of 6)]
[im 1/1]
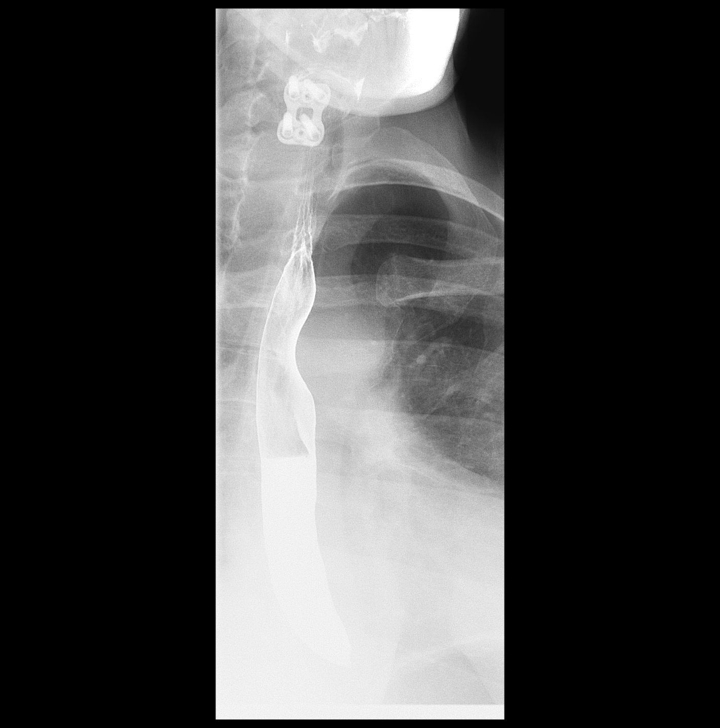

[Series 7: fluoro_barium swallow 2fps_bw · 0.18mm/px · 1 of 1 slices shown (2 of 6)]
[im 1/1]
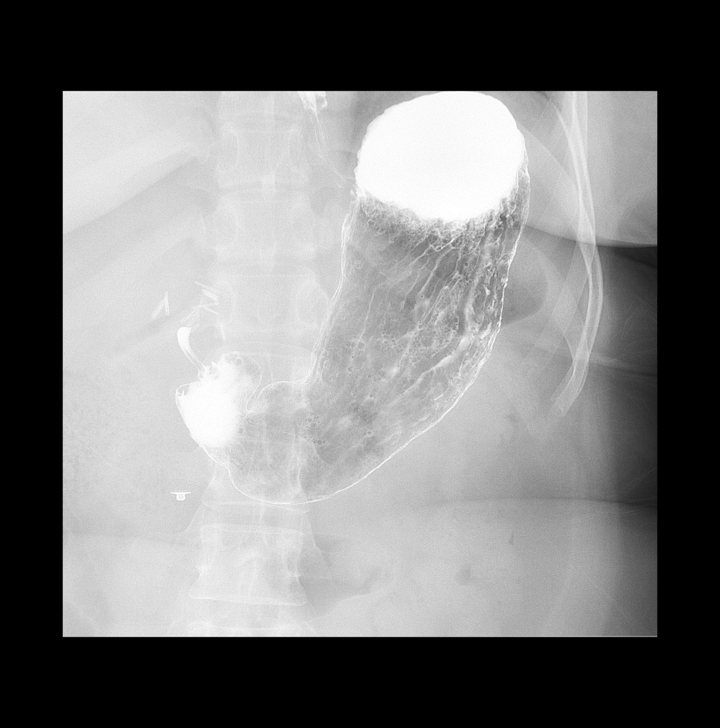

[Series 9: fluoro_barium swallow 2fps_bw · 0.19mm/px · 1 of 1 slices shown (3 of 6)]
[im 1/1]
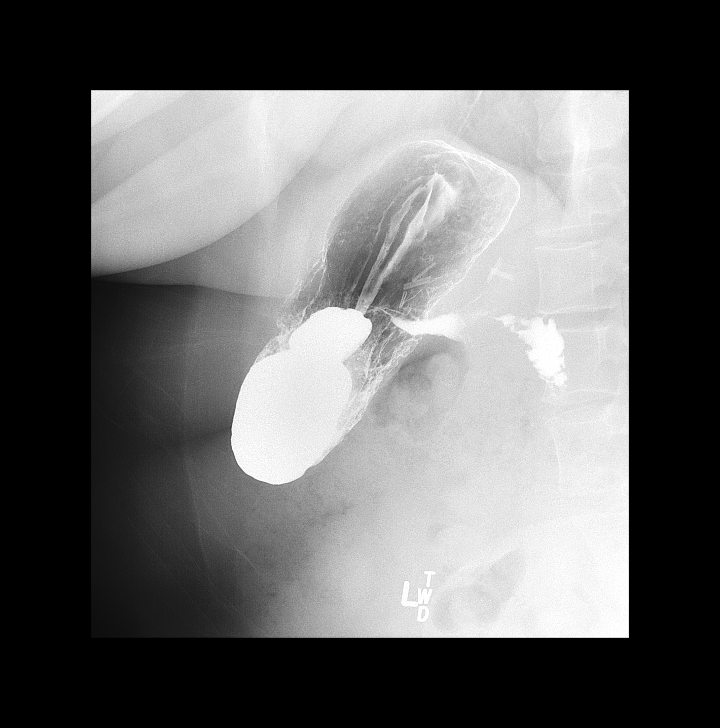

[Series 12: fluoro_barium swallow 2fps_bw · 0.19mm/px · 1 of 1 slices shown (4 of 6)]
[im 1/1]
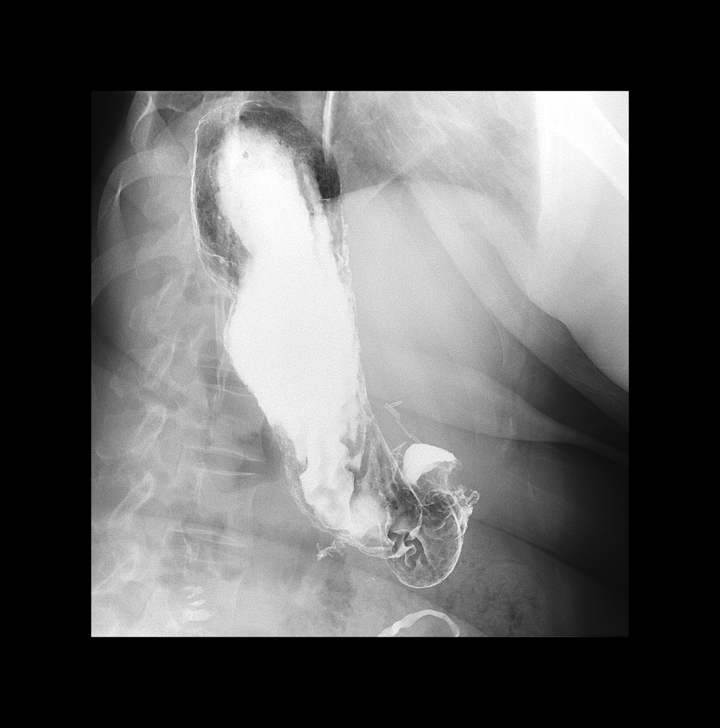

[Series 15: fluoro_barium swallow 2fps_bw · 0.18mm/px · 1 of 1 slices shown (5 of 6)]
[im 1/1]
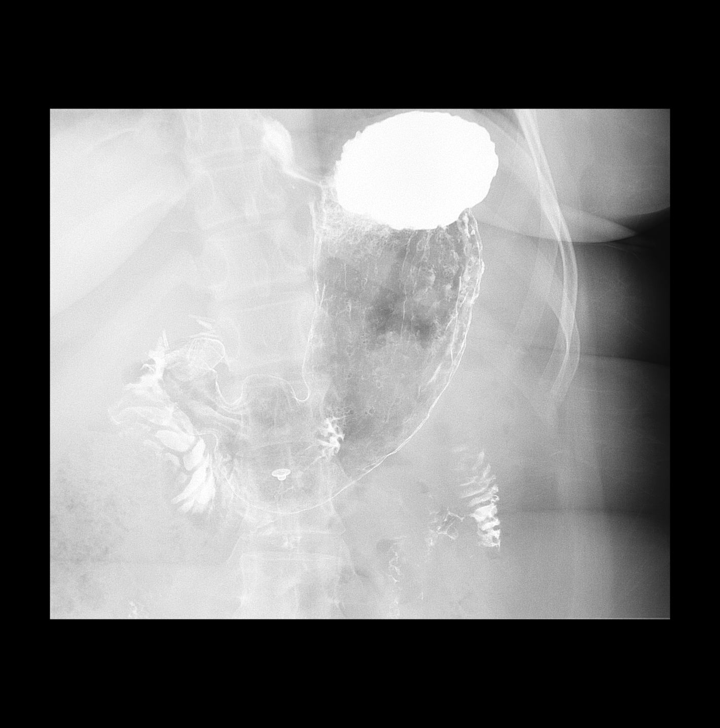

[Series 16: cp_standard · 0.56mm/px · 2 of 29 frames shown (1 of 3)]
[frame 15/29]
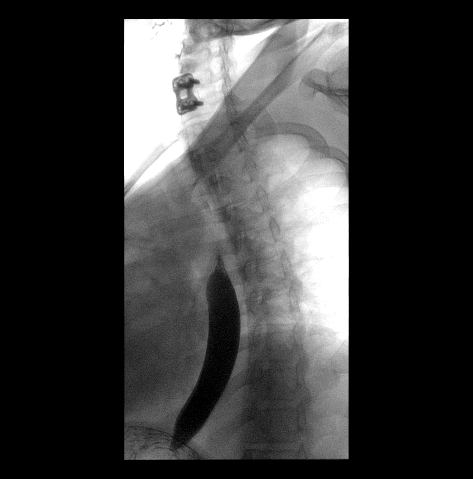
[frame 25/29]
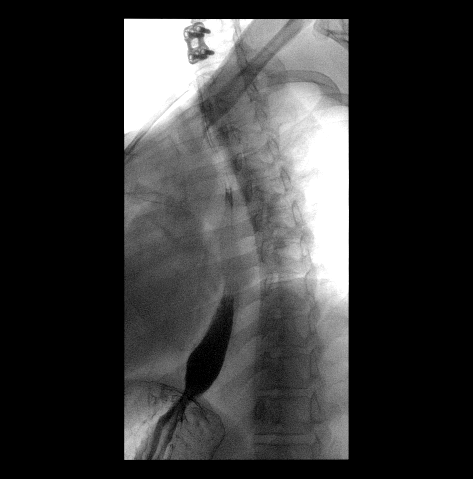

[Series 17: cp_standard · 0.57mm/px · 1 of 30 frames shown (2 of 3)]
[frame 16/30]
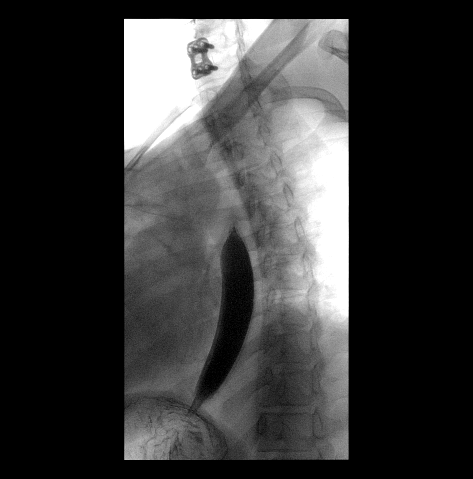

[Series 19: fluoro_barium swallow 2fps_bw · 0.19mm/px · 1 of 1 slices shown (6 of 6)]
[im 1/1]
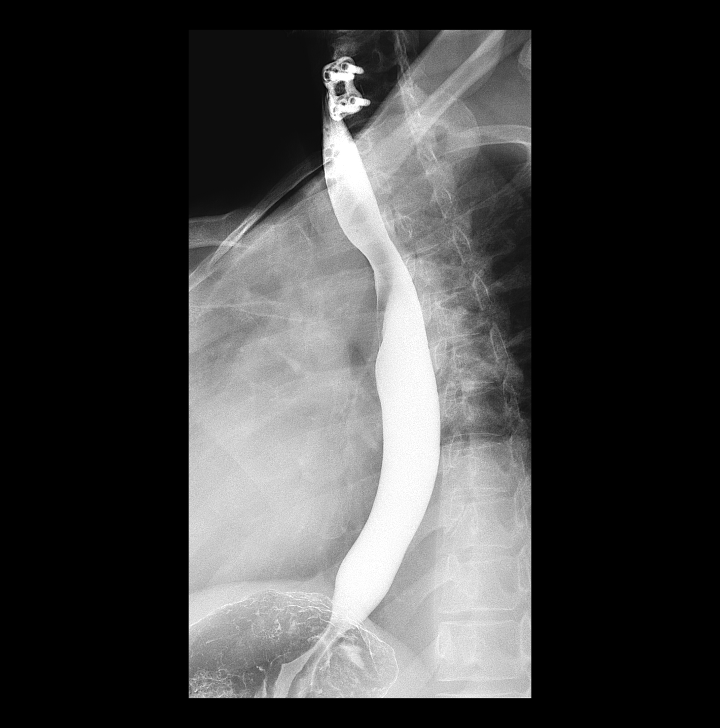

[Series 21: cp_standard · 0.57mm/px · 2 of 28 frames shown (3 of 3)]
[frame 5/28]
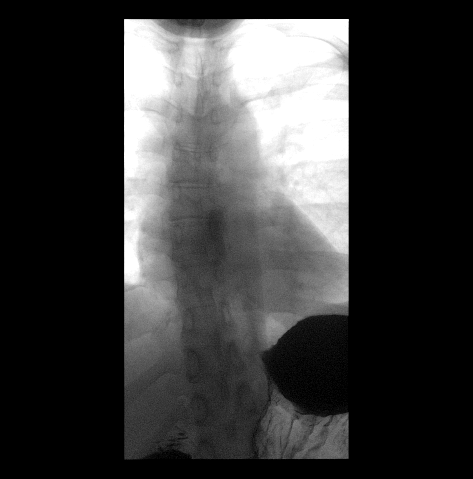
[frame 24/28]
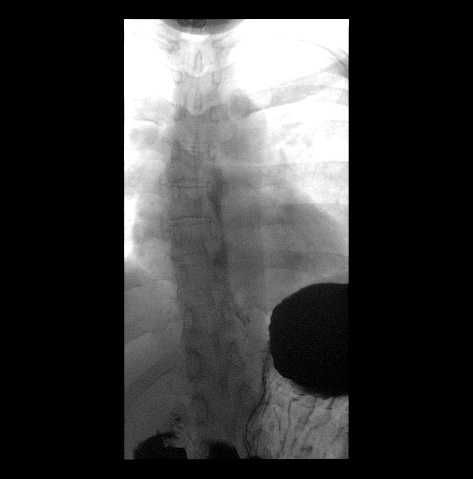

[Series 25: fluoro_barium singleshot_bw · 0.18mm/px · 1 of 1 slices shown]
[im 1/1]
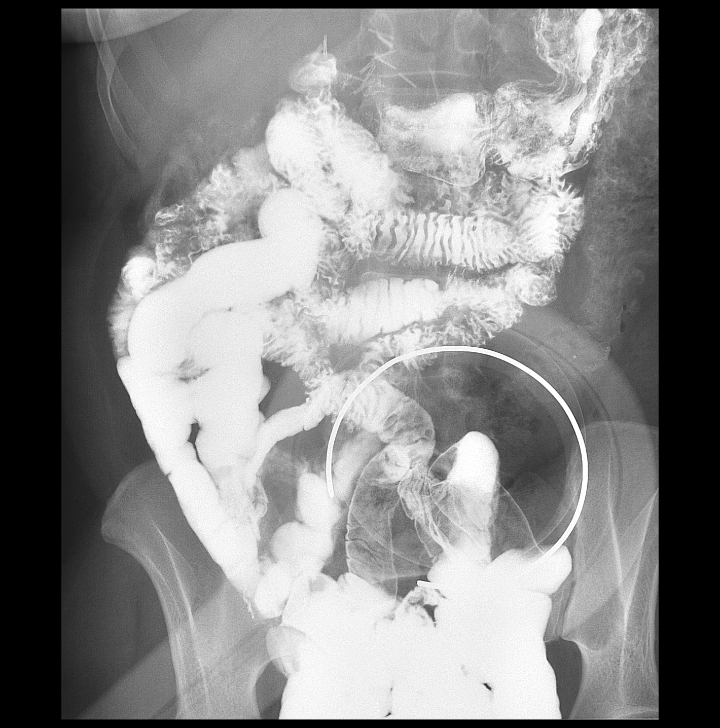

[13 of 24 positions shown; findings below may reference images not displayed]

FINDINGS: Initial KUB demonstrates surgical clips projecting over the right
upper quadrant of the abdomen, compatible with prior
cholecystectomy. No pathologic dilatation of small bowel or colon.
Surgical clips in the low anatomic pelvis, presumably related to
prior hysterectomy.

Double contrast images of the esophagus demonstrated a normal
appearance of the esophageal mucosa. Multiple single swallow
attempts were observed, which demonstrated normal esophageal
motility. Full column esophagram demonstrated no esophageal mass,
stricture, esophageal ring or hiatal hernia. Water siphon test
demonstrated mild gastroesophageal reflux.

Subsequently, double contrast images of the stomach demonstrated a
normal appearance of the gastric mucosa. No definite gastric ulcer
or gastric mass was identified. Duodenal bulb was normal in
appearance.

Small bowel follow-through demonstrated a normal appearance of the
small bowel mucosa. No definite small bowel stricture or small bowel
mass was identified. Assessment of the mid to distal small bowel was
slightly limited secondary to multiple overlapping loops of small
bowel in the low anatomic pelvis and the patient status post
hysterectomy. Terminal ileum was normal in appearance.
IMPRESSION: 1. Mild gastroesophageal reflux during water siphon test.
2. Otherwise, normal upper GI and small-bowel follow-through, as
above.
